# Patient Record
Sex: Male | Born: 1935 | Race: White | Hispanic: No | Smoking: Former smoker
Health system: Southern US, Community
[De-identification: ages and names within clinical notes are randomized; demographics above are authoritative.]

## PROBLEM LIST (undated history)

## (undated) DIAGNOSIS — Q396 Congenital diverticulum of esophagus: Secondary | ICD-10-CM

## (undated) DIAGNOSIS — I10 Essential (primary) hypertension: Secondary | ICD-10-CM

## (undated) DIAGNOSIS — N400 Enlarged prostate without lower urinary tract symptoms: Secondary | ICD-10-CM

## (undated) DIAGNOSIS — I4891 Unspecified atrial fibrillation: Secondary | ICD-10-CM

## (undated) DIAGNOSIS — R9389 Abnormal findings on diagnostic imaging of other specified body structures: Secondary | ICD-10-CM

## (undated) DIAGNOSIS — R55 Syncope and collapse: Secondary | ICD-10-CM

## (undated) DIAGNOSIS — J449 Chronic obstructive pulmonary disease, unspecified: Secondary | ICD-10-CM

## (undated) DIAGNOSIS — I480 Paroxysmal atrial fibrillation: Secondary | ICD-10-CM

## (undated) DIAGNOSIS — M549 Dorsalgia, unspecified: Secondary | ICD-10-CM

## (undated) DIAGNOSIS — G8929 Other chronic pain: Secondary | ICD-10-CM

## (undated) HISTORY — DX: Paroxysmal atrial fibrillation: I48.0

## (undated) HISTORY — DX: Syncope and collapse: R55

## (undated) HISTORY — DX: Essential (primary) hypertension: I10

## (undated) HISTORY — DX: Unspecified atrial fibrillation: I48.91

## (undated) HISTORY — PX: APPENDECTOMY: SHX54

## (undated) HISTORY — DX: Abnormal findings on diagnostic imaging of other specified body structures: R93.89

## (undated) HISTORY — DX: Chronic obstructive pulmonary disease, unspecified: J44.9

## (undated) HISTORY — PX: JOINT REPLACEMENT: SHX530

---

## 1998-04-18 ENCOUNTER — Other Ambulatory Visit: Admission: RE | Admit: 1998-04-18 | Discharge: 1998-04-18 | Payer: Self-pay | Admitting: Cardiology

## 1998-04-21 ENCOUNTER — Ambulatory Visit (HOSPITAL_COMMUNITY): Admission: RE | Admit: 1998-04-21 | Discharge: 1998-04-21 | Payer: Self-pay | Admitting: Cardiology

## 1998-10-02 ENCOUNTER — Ambulatory Visit (HOSPITAL_COMMUNITY): Admission: RE | Admit: 1998-10-02 | Discharge: 1998-10-02 | Payer: Self-pay | Admitting: Gastroenterology

## 1999-01-31 ENCOUNTER — Encounter: Payer: Self-pay | Admitting: Urology

## 1999-01-31 ENCOUNTER — Inpatient Hospital Stay (HOSPITAL_COMMUNITY): Admission: RE | Admit: 1999-01-31 | Discharge: 1999-02-16 | Payer: Self-pay | Admitting: Surgery

## 1999-02-11 ENCOUNTER — Encounter: Payer: Self-pay | Admitting: Surgery

## 1999-02-13 ENCOUNTER — Encounter: Payer: Self-pay | Admitting: Urology

## 1999-02-13 ENCOUNTER — Encounter: Payer: Self-pay | Admitting: Surgery

## 1999-12-16 ENCOUNTER — Observation Stay (HOSPITAL_COMMUNITY): Admission: EM | Admit: 1999-12-16 | Discharge: 1999-12-17 | Payer: Self-pay | Admitting: *Deleted

## 2000-04-11 ENCOUNTER — Ambulatory Visit (HOSPITAL_COMMUNITY): Admission: RE | Admit: 2000-04-11 | Discharge: 2000-04-11 | Payer: Self-pay | Admitting: Gastroenterology

## 2002-08-10 ENCOUNTER — Ambulatory Visit (HOSPITAL_COMMUNITY): Admission: RE | Admit: 2002-08-10 | Discharge: 2002-08-10 | Payer: Self-pay | Admitting: Cardiology

## 2002-08-25 ENCOUNTER — Encounter: Admission: RE | Admit: 2002-08-25 | Discharge: 2002-08-25 | Payer: Self-pay | Admitting: Critical Care Medicine

## 2002-08-25 ENCOUNTER — Encounter: Payer: Self-pay | Admitting: Critical Care Medicine

## 2002-10-29 ENCOUNTER — Ambulatory Visit: Admission: RE | Admit: 2002-10-29 | Discharge: 2002-10-29 | Payer: Self-pay | Admitting: Internal Medicine

## 2003-03-28 ENCOUNTER — Encounter: Payer: Self-pay | Admitting: Critical Care Medicine

## 2003-03-28 ENCOUNTER — Ambulatory Visit (HOSPITAL_COMMUNITY): Admission: RE | Admit: 2003-03-28 | Discharge: 2003-03-28 | Payer: Self-pay | Admitting: Critical Care Medicine

## 2004-05-21 ENCOUNTER — Encounter: Admission: RE | Admit: 2004-05-21 | Discharge: 2004-05-21 | Payer: Self-pay | Admitting: Cardiology

## 2004-10-01 ENCOUNTER — Ambulatory Visit: Payer: Self-pay | Admitting: Internal Medicine

## 2004-10-01 ENCOUNTER — Inpatient Hospital Stay (HOSPITAL_COMMUNITY): Admission: RE | Admit: 2004-10-01 | Discharge: 2004-10-02 | Payer: Self-pay | Admitting: Internal Medicine

## 2004-10-02 ENCOUNTER — Encounter: Payer: Self-pay | Admitting: Cardiovascular Disease

## 2004-10-05 ENCOUNTER — Inpatient Hospital Stay (HOSPITAL_COMMUNITY): Admission: EM | Admit: 2004-10-05 | Discharge: 2004-10-08 | Payer: Self-pay | Admitting: Emergency Medicine

## 2004-10-05 ENCOUNTER — Ambulatory Visit: Payer: Self-pay | Admitting: Cardiology

## 2004-10-11 ENCOUNTER — Ambulatory Visit: Payer: Self-pay

## 2004-10-19 ENCOUNTER — Ambulatory Visit: Payer: Self-pay | Admitting: Internal Medicine

## 2005-01-03 ENCOUNTER — Encounter: Admission: RE | Admit: 2005-01-03 | Discharge: 2005-01-03 | Payer: Self-pay | Admitting: Otolaryngology

## 2005-01-30 ENCOUNTER — Ambulatory Visit: Payer: Self-pay | Admitting: Internal Medicine

## 2005-08-15 ENCOUNTER — Encounter: Admission: RE | Admit: 2005-08-15 | Discharge: 2005-08-15 | Payer: Self-pay | Admitting: Geriatric Medicine

## 2006-01-28 ENCOUNTER — Ambulatory Visit: Payer: Self-pay | Admitting: Cardiology

## 2006-02-03 ENCOUNTER — Ambulatory Visit: Admission: RE | Admit: 2006-02-03 | Discharge: 2006-02-03 | Payer: Self-pay | Admitting: Cardiology

## 2006-12-25 ENCOUNTER — Ambulatory Visit: Payer: Self-pay | Admitting: Cardiology

## 2007-01-01 ENCOUNTER — Ambulatory Visit: Payer: Self-pay | Admitting: Cardiology

## 2007-01-01 LAB — CONVERTED CEMR LAB
Alkaline Phosphatase: 83 units/L (ref 39–117)
Bilirubin, Direct: 0.2 mg/dL (ref 0.0–0.3)
Eosinophils Absolute: 0.1 10*3/uL (ref 0.0–0.6)
Eosinophils Relative: 1.6 % (ref 0.0–5.0)
GFR calc Af Amer: 92 mL/min
GFR calc non Af Amer: 76 mL/min
Glucose, Bld: 84 mg/dL (ref 70–99)
HCT: 39.3 % (ref 39.0–52.0)
HDL: 55.1 mg/dL (ref >39.0)
Lymphocytes Relative: 12.5 % (ref 12.0–46.0)
MCV: 100.5 fL — ABNORMAL HIGH (ref 78.0–100.0)
Neutro Abs: 5.9 10*3/uL (ref 1.4–7.7)
Neutrophils Relative %: 75.9 % (ref 43.0–77.0)
Potassium: 3.8 meq/L (ref 3.5–5.1)
Sodium: 140 meq/L (ref 135–145)
TSH: 2.17 microintl units/mL (ref 0.35–5.50)
Triglycerides: 55 mg/dL (ref 0–149)
WBC: 7.8 10*3/uL (ref 4.5–10.5)

## 2007-01-28 ENCOUNTER — Ambulatory Visit: Payer: Self-pay | Admitting: Cardiology

## 2007-01-28 LAB — CONVERTED CEMR LAB
Calcium: 9 mg/dL (ref 8.4–10.5)
Chloride: 104 meq/L (ref 96–112)
GFR calc non Af Amer: 86 mL/min
Sodium: 143 meq/L (ref 135–145)

## 2007-05-03 ENCOUNTER — Emergency Department (HOSPITAL_COMMUNITY): Admission: EM | Admit: 2007-05-03 | Discharge: 2007-05-04 | Payer: Self-pay | Admitting: Emergency Medicine

## 2007-06-05 ENCOUNTER — Ambulatory Visit: Payer: Self-pay | Admitting: Cardiology

## 2007-06-12 ENCOUNTER — Ambulatory Visit: Payer: Self-pay

## 2007-06-20 ENCOUNTER — Emergency Department (HOSPITAL_COMMUNITY): Admission: EM | Admit: 2007-06-20 | Discharge: 2007-06-20 | Payer: Self-pay | Admitting: Emergency Medicine

## 2007-07-02 ENCOUNTER — Emergency Department (HOSPITAL_COMMUNITY): Admission: EM | Admit: 2007-07-02 | Discharge: 2007-07-02 | Payer: Self-pay | Admitting: Emergency Medicine

## 2007-07-08 ENCOUNTER — Encounter (INDEPENDENT_AMBULATORY_CARE_PROVIDER_SITE_OTHER): Payer: Self-pay | Admitting: Surgery

## 2007-07-08 ENCOUNTER — Ambulatory Visit (HOSPITAL_COMMUNITY): Admission: RE | Admit: 2007-07-08 | Discharge: 2007-07-09 | Payer: Self-pay | Admitting: Surgery

## 2007-09-08 ENCOUNTER — Ambulatory Visit (HOSPITAL_COMMUNITY): Admission: RE | Admit: 2007-09-08 | Discharge: 2007-09-08 | Payer: Self-pay | Admitting: Geriatric Medicine

## 2008-01-21 ENCOUNTER — Ambulatory Visit: Payer: Self-pay | Admitting: Cardiology

## 2008-01-27 ENCOUNTER — Ambulatory Visit: Payer: Self-pay | Admitting: Cardiology

## 2008-01-27 LAB — CONVERTED CEMR LAB
ALT: 22 units/L (ref 0–53)
AST: 25 units/L (ref 0–37)
Albumin: 4 g/dL (ref 3.5–5.2)
Alkaline Phosphatase: 81 units/L (ref 39–117)
BUN: 24 mg/dL — ABNORMAL HIGH (ref 6–23)
CO2: 31 meq/L (ref 19–32)
Eosinophils Relative: 2.1 % (ref 0.0–5.0)
GFR calc Af Amer: 92 mL/min
Glucose, Bld: 96 mg/dL (ref 70–99)
HCT: 37.7 % — ABNORMAL LOW (ref 39.0–52.0)
Hemoglobin: 12.9 g/dL — ABNORMAL LOW (ref 13.0–17.0)
Monocytes Absolute: 0.6 10*3/uL (ref 0.1–1.0)
Monocytes Relative: 8.6 % (ref 3.0–12.0)
Platelets: 393 10*3/uL (ref 150–400)
Potassium: 3.9 meq/L (ref 3.5–5.1)
Total Protein: 6.7 g/dL (ref 6.0–8.3)
WBC: 6.7 10*3/uL (ref 4.5–10.5)

## 2008-05-31 ENCOUNTER — Encounter: Admission: RE | Admit: 2008-05-31 | Discharge: 2008-05-31 | Payer: Self-pay | Admitting: Urology

## 2008-09-12 ENCOUNTER — Encounter: Admission: RE | Admit: 2008-09-12 | Discharge: 2008-10-10 | Payer: Self-pay | Admitting: Geriatric Medicine

## 2008-11-07 ENCOUNTER — Ambulatory Visit (HOSPITAL_COMMUNITY): Admission: RE | Admit: 2008-11-07 | Discharge: 2008-11-07 | Payer: Self-pay | Admitting: Geriatric Medicine

## 2008-11-08 ENCOUNTER — Encounter: Admission: RE | Admit: 2008-11-08 | Discharge: 2008-11-21 | Payer: Self-pay | Admitting: Geriatric Medicine

## 2008-11-09 ENCOUNTER — Encounter: Admission: RE | Admit: 2008-11-09 | Discharge: 2008-11-09 | Payer: Self-pay | Admitting: Geriatric Medicine

## 2008-11-22 ENCOUNTER — Encounter: Admission: RE | Admit: 2008-11-22 | Discharge: 2008-11-22 | Payer: Self-pay | Admitting: Geriatric Medicine

## 2008-12-08 ENCOUNTER — Encounter: Admission: RE | Admit: 2008-12-08 | Discharge: 2008-12-08 | Payer: Self-pay | Admitting: Geriatric Medicine

## 2009-02-06 ENCOUNTER — Encounter: Admission: RE | Admit: 2009-02-06 | Discharge: 2009-02-06 | Payer: Self-pay | Admitting: Geriatric Medicine

## 2009-03-23 ENCOUNTER — Ambulatory Visit: Payer: Self-pay | Admitting: Cardiology

## 2009-03-23 DIAGNOSIS — I4891 Unspecified atrial fibrillation: Secondary | ICD-10-CM | POA: Insufficient documentation

## 2009-03-23 DIAGNOSIS — I10 Essential (primary) hypertension: Secondary | ICD-10-CM

## 2009-03-23 DIAGNOSIS — R079 Chest pain, unspecified: Secondary | ICD-10-CM

## 2009-03-27 ENCOUNTER — Telehealth: Payer: Self-pay | Admitting: Cardiology

## 2009-04-03 ENCOUNTER — Telehealth (INDEPENDENT_AMBULATORY_CARE_PROVIDER_SITE_OTHER): Payer: Self-pay | Admitting: *Deleted

## 2009-04-04 ENCOUNTER — Ambulatory Visit: Payer: Self-pay

## 2009-04-04 ENCOUNTER — Encounter: Payer: Self-pay | Admitting: Cardiology

## 2009-04-06 ENCOUNTER — Telehealth: Payer: Self-pay | Admitting: Cardiology

## 2009-04-07 ENCOUNTER — Encounter: Payer: Self-pay | Admitting: Cardiology

## 2009-05-08 ENCOUNTER — Telehealth: Payer: Self-pay | Admitting: Cardiology

## 2009-05-11 ENCOUNTER — Ambulatory Visit (HOSPITAL_COMMUNITY): Admission: RE | Admit: 2009-05-11 | Discharge: 2009-05-11 | Payer: Self-pay | Admitting: Orthopedic Surgery

## 2009-05-11 ENCOUNTER — Encounter: Payer: Self-pay | Admitting: Cardiology

## 2009-06-26 ENCOUNTER — Telehealth: Payer: Self-pay | Admitting: Cardiology

## 2009-06-27 ENCOUNTER — Ambulatory Visit: Payer: Self-pay | Admitting: Cardiology

## 2009-07-03 ENCOUNTER — Inpatient Hospital Stay (HOSPITAL_COMMUNITY): Admission: RE | Admit: 2009-07-03 | Discharge: 2009-07-06 | Payer: Self-pay | Admitting: Orthopedic Surgery

## 2009-08-15 ENCOUNTER — Encounter (INDEPENDENT_AMBULATORY_CARE_PROVIDER_SITE_OTHER): Payer: Self-pay | Admitting: *Deleted

## 2009-08-16 ENCOUNTER — Telehealth: Payer: Self-pay | Admitting: Cardiology

## 2009-12-12 ENCOUNTER — Ambulatory Visit: Payer: Self-pay | Admitting: Cardiology

## 2009-12-12 DIAGNOSIS — I5032 Chronic diastolic (congestive) heart failure: Secondary | ICD-10-CM

## 2010-03-01 ENCOUNTER — Encounter: Payer: Self-pay | Admitting: Cardiology

## 2010-03-23 ENCOUNTER — Emergency Department (HOSPITAL_COMMUNITY)
Admission: EM | Admit: 2010-03-23 | Discharge: 2010-03-23 | Payer: Self-pay | Source: Home / Self Care | Admitting: Emergency Medicine

## 2010-03-26 ENCOUNTER — Telehealth: Payer: Self-pay | Admitting: Cardiology

## 2010-03-30 ENCOUNTER — Ambulatory Visit: Payer: Self-pay | Admitting: Cardiology

## 2010-06-15 ENCOUNTER — Ambulatory Visit: Payer: Self-pay | Admitting: Internal Medicine

## 2010-06-15 DIAGNOSIS — R930 Abnormal findings on diagnostic imaging of skull and head, not elsewhere classified: Secondary | ICD-10-CM

## 2010-06-19 LAB — CONVERTED CEMR LAB
ALT: 23 units/L (ref 0–53)
AST: 30 units/L (ref 0–37)
BUN: 20 mg/dL (ref 6–23)
Basophils Absolute: 0 10*3/uL (ref 0.0–0.1)
Bilirubin, Direct: 0.3 mg/dL (ref 0.0–0.3)
Calcium: 8.9 mg/dL (ref 8.4–10.5)
Creatinine, Ser: 0.9 mg/dL (ref 0.4–1.5)
Eosinophils Relative: 1 % (ref 0.0–5.0)
GFR calc non Af Amer: 81.81 mL/min (ref >60)
Monocytes Relative: 6.2 % (ref 3.0–12.0)
Neutrophils Relative %: 79.1 % — ABNORMAL HIGH (ref 43.0–77.0)
Platelets: 373 10*3/uL (ref 150.0–400.0)
Pro B Natriuretic peptide (BNP): 49.8 pg/mL (ref 0.0–100.0)
RDW: 13.8 % (ref 11.5–14.6)
Total Bilirubin: 1.1 mg/dL (ref 0.3–1.2)
WBC: 5.8 10*3/uL (ref 4.5–10.5)

## 2010-07-19 ENCOUNTER — Telehealth: Payer: Self-pay | Admitting: Internal Medicine

## 2010-08-06 ENCOUNTER — Emergency Department (HOSPITAL_COMMUNITY): Admission: EM | Admit: 2010-08-06 | Discharge: 2010-08-07 | Payer: Self-pay | Admitting: Emergency Medicine

## 2010-08-28 ENCOUNTER — Encounter (INDEPENDENT_AMBULATORY_CARE_PROVIDER_SITE_OTHER): Payer: Self-pay | Admitting: *Deleted

## 2010-11-13 NOTE — Assessment & Plan Note (Signed)
Summary: f61m   Visit Type:  Follow-up Primary Provider:  stoneking  CC:  no cardaic complaints.  History of Present Illness: The patient is 77 years old and return for management of atrial fibrillation, bradycardia, and diastolic heart failure. He has a history of paroxysmal fibrillation which is managed with amiodarone and Coumadin. I saw him preoperatively last September at which time he had marked bradycardia. We have our name with a Myoview scan which was negative for ischemia and showed normal LV function. We approved him for total knee replacement by Dr.Olin  Despite his advanced   He mistakenly did not cut his amiodarone back to 100 mg and is currently taking 200 mg daily.  Dr. Larina Bras came stopped his amiodarone because his blood pressure was low and controlled without it.  He has been doing well recently with no chest pain shortness of breath or palpitations. He's had minimal edema.   Current Medications (verified): 1)  Warfarin Sodium 2 Mg Tabs (Warfarin Sodium) .... As Directed 2)  Aspirin 81 Mg Tbec (Aspirin) .... Take One Tablet By Mouth Daily 3)  Doxazosin Mesylate 4 Mg Tabs (Doxazosin Mesylate) .... As Directed 4)  Furosemide 80 Mg Tabs (Furosemide) .... Take One Tablet By Mouth Daily. 5)  Tylenol 1250mg  .... As Needed 6)  Hydrocodone-Acetaminophen 7.5-750 Mg Tabs (Hydrocodone-Acetaminophen) .... 2  Tabs As Needed 7)  Temazepam 30 Mg Caps (Temazepam) .... As Directed 8)  Polyethylene Glycol 3350  Powd (Polyethylene Glycol 3350) .... As Needed 9)  Multivitamins   Tabs (Multiple Vitamin) .... Daily 10)  Fentanyl 25 Mcg/hr Pt72 (Fentanyl) .Marland Kitchen.. 1 Patch Every 3rd Day 11)  Amiodarone Hcl 200 Mg Tabs (Amiodarone Hcl) .Marland Kitchen.. 1 Tab Once Daily 12)  Cymbalta 20 Mg Cpep (Duloxetine Hcl) .Marland Kitchen.. 1 Tab Once Daily For Depression  Allergies (verified): No Known Drug Allergies  Past History:  Past Medical History: Reviewed history from 03/22/2009 and no changes required.  Significant  for COPD,  mild insufficiency, and   hypertension.                                                                                                                                                                1.  Atrial fibrillation with very slow ventricular rate and symptoms of      weakness.  2.  History of paroxysmal atrial fibrillation, currently on amiodarone load.  3.  Previous syncope probably related to sinus pause, finally conversion      from atrial fibrillation to sinus rhythm.  4.  Good left ventricular function.  5.  Chronic obstructive pulmonary disease.  6.  History of hypertension.      Review of Systems       ROS is negative except as outlined in HPI.   Vital Signs:  Patient profile:   77 year old male Height:      66 inches Weight:      164 pounds Pulse rate:   45 / minute BP sitting:   120 / 70  (left arm) Cuff size:   regular  Vitals Entered By: Burnett Kanaris, CNA (December 12, 2009 11:05 AM)  Physical Exam  Additional Exam:  Gen. Well-nourished, in no distress   Neck: No JVD, thyroid not enlarged, no carotid bruits Lungs: No tachypnea, clear without rales, rhonchi or wheezes Cardiovascular: Rhythm regular, PMI not displaced,  heart sounds  normal, no murmurs or gallops, trace to 1+ peripheral edema, pulses normal in all 4 extremities. Abdomen: BS normal, abdomen soft and non-tender without masses or organomegaly, no hepatosplenomegaly. MS: No deformities, no cyanosis or clubbing   Neuro:  No focal sns   Skin:  no lesions    Impression & Recommendations:  Problem # 1:  ATRIAL FIBRILLATION (ICD-427.31)  He has paroxysmal atrial fibrillation which is controlled with amiodarone and Coumadin. His rate is still quite slow at 45. He mistakenly was taking 200 mg of amiodarone and we will decrease this to 100 mg daily. His updated medication list for this problem includes:    Warfarin Sodium 2 Mg Tabs (Warfarin sodium) .Marland Kitchen... As directed    Aspirin 81 Mg Tbec  (Aspirin) .Marland Kitchen... Take one tablet by mouth daily    Amiodarone Hcl 200 Mg Tabs (Amiodarone hcl) .Marland Kitchen... 1/2  tab once daily  Orders: EKG w/ Interpretation (93000)  Problem # 2:  HYPERTENSION, BENIGN (ICD-401.1)  This is currently controlled without any antihypertensive medications. The amlodipine was discontinued.   The following medications were removed from the medication list:    Amlodipine Besylate 5 Mg Tabs (Amlodipine besylate) .Marland Kitchen... Take one tablet by mouth daily His updated medication list for this problem includes:    Aspirin 81 Mg Tbec (Aspirin) .Marland Kitchen... Take one tablet by mouth daily    Doxazosin Mesylate 4 Mg Tabs (Doxazosin mesylate) .Marland Kitchen... As directed    Furosemide 80 Mg Tabs (Furosemide) .Marland Kitchen... Take one tablet by mouth daily.  Orders: EKG w/ Interpretation (93000)  Problem # 3:  DIASTOLIC HEART FAILURE, CHRONIC (ICD-428.32)  He has a history of diastolic heart failure. He has trace to 1+ lower extremity edema which may be related to venous insufficiency since there is no JVD. He is on chronic Lasix therapy. We will continue his current therapy. The following medications were removed from the medication list:    Amlodipine Besylate 5 Mg Tabs (Amlodipine besylate) .Marland Kitchen... Take one tablet by mouth daily His updated medication list for this problem includes:    Warfarin Sodium 2 Mg Tabs (Warfarin sodium) .Marland Kitchen... As directed    Aspirin 81 Mg Tbec (Aspirin) .Marland Kitchen... Take one tablet by mouth daily    Furosemide 80 Mg Tabs (Furosemide) .Marland Kitchen... Take one tablet by mouth daily.    Amiodarone Hcl 200 Mg Tabs (Amiodarone hcl) .Marland Kitchen... 1/2  tab once daily  Orders: EKG w/ Interpretation (93000)  Patient Instructions: 1)  Your physician has recommended you make the following change in your medication: 1) Decrease amiodarone to 100mg  once daily  2)  Your physician wants you to follow-up in: 1 year with Dr. Shirlee Latch.  You will receive a reminder letter in the mail two months in advance. If you don't  receive a letter, please call our office to schedule the follow-up appointment. 3)  Make Dr. Pete Glatter aware of your amiodarone being decreased to 1/2 tablet  daily due to your coumadin.

## 2010-11-13 NOTE — Progress Notes (Signed)
Summary: pt had chest pain on saturday  Phone Note Call from Patient Call back at Home Phone [phone]   Caller: Patient Reason for Call: Talk to Nurse, Talk to Doctor Summary of Call: pt was in ER saturday and was told he was in a-fib and he wants to discuss it with someone Initial call taken by: Omer Jack,  March 26, 2010 10:46 AM  Follow-up for Phone Call        03/26/10--spoke with pt earlier this am--pt states went to hosp. this past weekend with CP and after eval. was told "heart was OK" but pt had gone back into a fib--per notes it appears dr Pete Glatter had reduced amiordaone to 100mg  due to low heart rate--advised, increase amiordarone to 200mg  and come in to see dr Juanda Chance 03/30/10 at 1130am--per dr Nazyia Gaugh--nt    pt agrees Follow-up by: Ledon Snare, RN,  March 26, 2010 12:36 PM

## 2010-11-13 NOTE — Letter (Signed)
Summary: Appointment - Reminder 2  [facility], Main Office  [address]   [place], [place] [address]   Phone: [phone]  Fax: [phone]         August 28, 2010 MRN: [id number]       [facility] [address] [place], [place]  [address]     Dear [name],  Our records indicate that it is time to schedule a follow-up appointment.  Dr.Mclean recommended that you follow up with [place] in December 2011. It is very important that we reach you to schedule this appointment. We look forward to participating in your health care needs. Please contact us at the number listed above at your earliest convenience to schedule your appointment.  If you are unable to make an appointment at this time, give [place] a call so we can update our records.     Sincerely,   Glass blower/designer

## 2010-11-13 NOTE — Letter (Signed)
Summary: [facility]   Imported By: Marylou Mccoy 04/27/2010 11:20:11  _____________________________________________________________________  External Attachment:    Type:   Image     Comment:   External Document

## 2010-11-13 NOTE — Progress Notes (Signed)
Summary: nos appt  Phone Note Call from Patient   Caller: [email or web address]  Call For: wert Summary of Call: In ref to nos from 10/5, pt states he doesn't want to rsc. Initial call taken by: Darletta Moll,  July 19, 2010 3:17 PM     Appended Document: nos appt as long as his breathing is not getting worse this is fine with me

## 2010-11-13 NOTE — Assessment & Plan Note (Signed)
Summary: appt 11:30/rov/chest pain/afib/per dr Tylin Stradley/jml   Primary Provider:  stoneking  CC:  chest pain/.  History of Present Illness: The patient is 77 years old and return for management of atrial fibrillation. About 2 weeks ago he developed chest pain and went to [facility] long emergency room. He had an electrocardiogram which showed atrial fibrillation with a rate of 107 but no acute changes. He had blood tests and a chest x-ray. It was recommended that he stay but he decided to go home instead. He has had no recurrence of his symptoms since that time. He went back on a full dose of 200 mg of amiodarone following his ED visit.  His head no palpitations. He is unable to tell when he goes into atrial fibrillation.  His other problems include hypertension and COPD.  Current Medications (verified): 1)  Warfarin Sodium 2 Mg Tabs (Warfarin Sodium) .... As Directed 2)  Aspirin 81 Mg Tbec (Aspirin) .... Take One Tablet By Mouth Daily 3)  Doxazosin Mesylate 4 Mg Tabs (Doxazosin Mesylate) .... As Directed 4)  Furosemide 80 Mg Tabs (Furosemide) .... Take One Tablet By Mouth Daily. 5)  Tylenol 1250mg  .... As Needed 6)  Hydrocodone-Acetaminophen 7.5-750 Mg Tabs (Hydrocodone-Acetaminophen) .... 2  Tabs As Needed 7)  Temazepam 30 Mg Caps (Temazepam) .... As Directed 8)  Polyethylene Glycol 3350  Powd (Polyethylene Glycol 3350) .... As Needed 9)  Multivitamins   Tabs (Multiple Vitamin) .... Daily 10)  Fentanyl 25 Mcg/hr Pt72 (Fentanyl) .Marland Kitchen.. 1 Patch Every 3rd Day 11)  Amiodarone Hcl 200 Mg Tabs (Amiodarone Hcl) .... 1/2  Tab Once Daily 12)  Cymbalta 20 Mg Cpep (Duloxetine Hcl) .Marland Kitchen.. 1 Tab Once Daily For Depression 13)  Amoxicillin 250 Mg Caps (Amoxicillin) .... Take One Capsule Per Day Per Oral Surgeon.  Allergies (verified): No Known Drug Allergies  Past History:  Past Medical History: Reviewed history from 03/22/2009 and no changes required.  Significant for COPD,  mild insufficiency, and   hypertension.                                                                                                                                                                1.  Atrial fibrillation with very slow ventricular rate and symptoms of      weakness.  2.  History of paroxysmal atrial fibrillation, currently on amiodarone load.  3.  Previous syncope probably related to sinus pause, finally conversion      from atrial fibrillation to sinus rhythm.  4.  Good left ventricular function.  5.  Chronic obstructive pulmonary disease.  6.  History of hypertension.      Review of Systems       ROS is negative except as outlined in HPI.   Vital Signs:  Patient  profile:   77 year old male Height:      66 inches Weight:      161 pounds BMI:     26.08 Pulse rate:   63 / minute Pulse rhythm:   regular BP sitting:   118 / 66  (left arm) Cuff size:   regular  Vitals Entered By: Burnett Kanaris, CNA (March 30, 2010 12:08 PM)  Physical Exam  Additional Exam:  Gen. Well-nourished, in no distress   Neck: No JVD, thyroid not enlarged, no carotid bruits Lungs: No tachypnea, clear without rales, rhonchi or wheezes Cardiovascular: Rhythm regular, PMI not displaced,  heart sounds  normal, no murmurs or gallops, no peripheral edema, pulses normal in all 4 extremities. Abdomen: BS normal, abdomen soft and non-tender without masses or organomegaly, no hepatosplenomegaly. MS: No deformities, no cyanosis or clubbing   Neuro:  No focal sns   Skin:  no lesions    Impression & Recommendations:  Problem # 1:  ATRIAL FIBRILLATION (ICD-427.31) He recently had recurrent atrial fibrillation while on a lower dose of amiodarone. He is now back on 200 mg daily and we will continue this. He is also taking Coumadin. His updated medication list for this problem includes:    Warfarin Sodium 2 Mg Tabs (Warfarin sodium) .Marland Kitchen... As directed    Aspirin 81 Mg Tbec (Aspirin) .Marland Kitchen... Take one tablet by mouth daily     Amiodarone Hcl 200 Mg Tabs (Amiodarone hcl) .Marland Kitchen... 1/2  tab once daily  Problem # 2:  HYPERTENSION, BENIGN (ICD-401.1) This appears well controlled on current medications. His updated medication list for this problem includes:    Aspirin 81 Mg Tbec (Aspirin) .Marland Kitchen... Take one tablet by mouth daily    Doxazosin Mesylate 4 Mg Tabs (Doxazosin mesylate) .Marland Kitchen... As directed    Furosemide 80 Mg Tabs (Furosemide) .Marland Kitchen... Take one tablet by mouth daily.  Other Orders: EKG w/ Interpretation (93000)  Patient Instructions: 1)  Your physician recommends that you schedule a follow-up appointment in: 6 months with Dr. Shirlee Latch. The office will mail you a reminder card 2 months prior appointment time.

## 2010-11-13 NOTE — Assessment & Plan Note (Signed)
Summary: Pulmonary/ new pt eval    Visit Type:  Initial Consult Copy to:  Dr. Pete Glatter Primary Provider/Referring Provider:  Dr. Pete Glatter  CC:  Dyspnea.  History of Present Illness: [name] quit smoking 1960's stopped due to heart burn > improved with no resp sequelae apparent then  June 15, 2010  1st pulmonary office eval in EMR era  with indolent onset x 15 years progressive doe using scooter for anything more than 50 ft walking  worse x  one year assoc with hoarseness and mild dysphagia sore throat  and nasal congestion.  no sign cough.  Pt denies any significant itching, sneezing,  excess or purulent nasal secretions,  fever, chills, sweats,   pleuritic or exertional cp, hempoptysis, change in activity tolerance  orthopnea pnd or leg swelling.  Pt also denies any obvious fluctuation in symptoms with weather or environmental change or other alleviating or aggravating factors.   "nothing's ever helped"  Current Medications (verified): 1)  Warfarin Sodium 2 Mg Tabs (Warfarin Sodium) .... As Directed 2)  Aspirin 81 Mg Tbec (Aspirin) .... Take One Tablet By Mouth Daily 3)  Doxazosin Mesylate 4 Mg Tabs (Doxazosin Mesylate) .... As Directed 4)  Furosemide 80 Mg Tabs (Furosemide) .... Take One Tablet By Mouth Daily. 5)  Temazepam 30 Mg Caps (Temazepam) .... As Directed 6)  Polyethylene Glycol 3350  Powd (Polyethylene Glycol 3350) .... As Needed 7)  Fentanyl 25 Mcg/hr Pt75 (Fentanyl) .Marland Kitchen.. 1 Patch Every 3rd Day 8)  Amiodarone Hcl 200 Mg Tabs (Amiodarone Hcl) .Marland Kitchen.. 1 Once Daily 9)  Cymbalta 20 Mg Cpep (Duloxetine Hcl) .Marland Kitchen.. 1 Tab Once Daily For Depression  Allergies (verified): No Known Drug Allergies  Past History:  Past Medical History:  1.  Atrial fibrillation with very slow ventricular rate and symptoms of      weakness.  2.  History of paroxysmal atrial fibrillation, currently on amiodarone load.  3.  Previous syncope probably related to sinus pause, finally conversion      from  atrial fibrillation to sinus rhythm.  4.  Good left ventricular function.  5.  Chronic obstructive pulmonary disease.  6.  History of hypertension. Abnormal CXR     - Elevated R HD 07/06/2007 > no change June 15, 2010       Family History: Heart dz- Brother- afib Negative for respiratory diseases or atopy   Social History: Divorced Lives alone Has children Former smoker.  Quit in 1965. Smoked for approx 15 yrs up to 3 ppd ETOH - daily  Retired Forensic scientist  Review of Systems       The patient complains of shortness of breath with activity, shortness of breath at rest, loss of appetite, weight change, sore throat, tooth/dental problems, nasal congestion/difficulty breathing through nose, ear ache, anxiety, and hand/feet swelling.  The patient denies productive cough, non-productive cough, coughing up blood, chest pain, irregular heartbeats, acid heartburn, indigestion, abdominal pain, difficulty swallowing, headaches, sneezing, itching, depression, joint stiffness or pain, rash, change in color of mucus, and fever.    Vital Signs:  Patient profile:   77 year old male Weight:      162 pounds O2 Sat:      93 % on Room air Temp:     98.0 degrees F oral Pulse rate:   70 / minute BP sitting:   124 / 76  (left arm)  Vitals Entered By: Vernie Murders (June 15, 2010 1:55 PM)  O2 Flow:  Room air  Serial Vital Signs/Assessments:  Comments: 2:29 PM Ambulatory Pulse Oximetry  Resting; HR__74___    02 Sat__93%ra___  Lap1 (185 feet)   HR_116___   02 Sat__93%ra___ Lap2 (185 feet)   HR_____   02 Sat_____    Lap3 (185 feet)   HR_____   02 Sat_____  ___Test Completed without Difficulty _x__Test Stopped due to: pt c/o feeling tired and SOB.   By: Vernie Murders    Physical Exam  Additional Exam:  Gen. chronically ill amb wm very somber with somewhat of a hopeless/helpless affect and attitude   wt 161 > 162 June 15, 2010  HEENT mild/ moderate nonspecific   turbinate edema.  Oropharynx no thrush or excess pnd or cobblestoning.  No JVD or cervical adenopathy. Mild accessory muscle hypertrophy. Trachea midline, nl thryroid. Chest was hyperinflated by percussion with mod kyphosis and  diminished breath sounds and moderate increased exp time without wheeze. Hoover sign positive at mid inspiration. Regular rate and rhythm without murmur gallop or rub or increase P2 or edema.  Abd: no hsm, nl excursion. Ext warm without cyanosis or clubbing.       Sodium                    140 mEq/L                   135-145   Potassium                 4.3 mEq/L                   3.5-5.1   Chloride                  105 mEq/L                   96-112   Carbon Dioxide            27 mEq/L                    19-32   Glucose                   98 mg/dL                    57-84   BUN                       20 mg/dL                    6-96   Creatinine                0.9 mg/dL                   2.9-5.2   Calcium                   8.9 mg/dL                   8.4-13.2   GFR                       81.81 mL/min                >60  Tests: (2) CBC Platelet w/Diff (CBCD)   White Cell Count          5.8 K/uL  4.5-10.5   Red Cell Count       [L]  3.31 Mil/uL                 4.22-5.81   Hemoglobin           [L]  11.8 g/dL                   04.5-40.9   Hematocrit           [L]  34.8 %                      39.0-52.0   MCV                  [H]  105.5 fl                    78.0-100.0   MCHC                      33.9 g/dL                   81.1-91.4   RDW                       13.8 %                      11.5-14.6   Platelet Count            373.0 K/uL                  150.0-400.0   Neutrophil %         [H]  79.1 %                      43.0-77.0   Lymphocyte %              12.9 %                      12.0-46.0   Monocyte %                6.2 %                       3.0-12.0   Eosinophils%              1.0 %                       0.0-5.0   Basophils %               0.8 %                        0.0-3.0   Neutrophill Absolute      4.6 K/uL                    1.4-7.7   Lymphocyte Absolute       0.7 K/uL                    0.7-4.0   Monocyte Absolute         0.4 K/uL                    0.1-1.0  Eosinophils, Absolute  0.1 K/uL                    0.0-0.7   Basophils Absolute        0.0 K/uL                    0.0-0.1  Tests: (3) Hepatic/Liver Function Panel (HEPATIC)   Total Bilirubin           1.1 mg/dL                   6.0-4.5   Direct Bilirubin          0.3 mg/dL                   4.0-9.8   Alkaline Phosphatase      83 U/L                      39-117   AST                       30 U/L                      0-37   ALT                       23 U/L                      0-53   Total Protein             6.4 g/dL                    1.1-9.1   Albumin                   3.9 g/dL                    4.7-8.2  Tests: (4) TSH (TSH)   FastTSH                   0.77 uIU/mL                 0.35-5.50  Tests: (5) B-Type Natiuretic Peptide (BNPR)  B-Type Natriuetic Peptide                             49.8 pg/mL                  0.0-100.0  Tests: (6) Sed Rate (ESR)   Sed Rate                  20 mm/hr                    0-22  CXR  Procedure date:  06/15/2010  Findings:      Stable cardiomegaly and stable interstitial accentuation.  No acute findings.  Impression & Recommendations:  Problem # 1:  DYSPNEA (ICD-786.05) No def ILD on cxr though hard to exclude incipient PF and is on amiodarone so will bring him back for PFTs baseline, suspect most of his problems are related to kyphosis and geriatric decline.  No need for 02 at this point.    DDX of  difficult airways managment all start with A and  include Adherence, Ace Inhibitors, Acid Reflux, Active Sinus Disease, Alpha 1  Antitripsin deficiency, Anxiety masquerading as Airways dz,  ABPA,  allergy(esp in young), Aspiration (esp in elderly), Adverse effects of DPI,  Active smokers, plus one B  =  Beta blocker use..  and one C = CHF (excluded by nl bnp for all intents and purposes)  ? Acid reflux >  See instructions for specific recommendations   ? active sinus dz > sinus ct next step  ? Anxiety/ depression>  dx of exclusion  Problem # 2:  ABNORMAL LUNG XRAY (ICD-793.1) Elevated R HD present since 07/06/2007 is the main finding on plain cxr and does not need further w/u at this point.  Medications Added to Medication List This Visit: 1)  Amiodarone Hcl 200 Mg Tabs (Amiodarone hcl) .Marland Kitchen.. 1 once daily 2)  Prilosec Otc 20 Mg Tbec (Omeprazole magnesium) .... Take  one 30-60 min before first meal of the day 3)  Pepcid Ac Maximum Strength 20 Mg Tabs (Famotidine) .... One at bedtime  Other Orders: T-2 View CXR (71020TC) New Patient Level V (08657) TLB-BMP (Basic Metabolic Panel-BMET) (80048-METABOL) TLB-CBC Platelet - w/Differential (85025-CBCD) TLB-Hepatic/Liver Function Pnl (80076-HEPATIC) TLB-TSH (Thyroid Stimulating Hormone) (84443-TSH) TLB-BNP (B-Natriuretic Peptide) (83880-BNPR) TLB-Sedimentation Rate (ESR) (85652-ESR) Pulse Oximetry, Ambulatory (84696)  Patient Instructions: 1)  GERD (REFLUX)  is a common cause of respiratory symptoms. It commonly presents without heartburn and can be treated with medication, but also with lifestyle changes including avoidance of late meals, excessive alcohol, smoking cessation, and avoid fatty foods, chocolate, peppermint, colas, red wine, and acidic juices such as orange juice. NO MINT OR MENTHOL PRODUCTS SO NO COUGH DROPS  2)  USE SUGARLESS CANDY INSTEAD (jolley ranchers)  3)  NO OIL BASED VITAMINS  4)  Prilosec 20 mg Take  one 30-60 min before first meal of the day and pepcid 20 mg at bedtime 5)  Please schedule a follow-up appointment in 4 weeks, sooner if needed  with PFT's on return

## 2010-12-13 ENCOUNTER — Other Ambulatory Visit: Payer: Self-pay | Admitting: Dermatology

## 2010-12-31 LAB — DIFFERENTIAL
Basophils Relative: 0 % (ref 0–1)
Eosinophils Absolute: 0.2 10*3/uL (ref 0.0–0.7)
Eosinophils Relative: 2 % (ref 0–5)
Monocytes Absolute: 1.3 10*3/uL — ABNORMAL HIGH (ref 0.1–1.0)
Monocytes Relative: 12 % (ref 3–12)
Neutro Abs: 7.5 10*3/uL (ref 1.7–7.7)

## 2010-12-31 LAB — URINALYSIS, ROUTINE W REFLEX MICROSCOPIC
Glucose, UA: NEGATIVE mg/dL
Hgb urine dipstick: NEGATIVE
Protein, ur: NEGATIVE mg/dL
Specific Gravity, Urine: 1.016 (ref 1.005–1.030)
Urobilinogen, UA: 1 mg/dL (ref 0.0–1.0)

## 2010-12-31 LAB — CBC
HCT: 35.7 % — ABNORMAL LOW (ref 39.0–52.0)
Hemoglobin: 11.9 g/dL — ABNORMAL LOW (ref 13.0–17.0)
MCHC: 33.2 g/dL (ref 30.0–36.0)
MCV: 107.6 fL — ABNORMAL HIGH (ref 78.0–100.0)
Platelets: 334 10*3/uL (ref 150–400)
RBC: 3.32 MIL/uL — ABNORMAL LOW (ref 4.22–5.81)
RDW: 13.9 % (ref 11.5–15.5)
WBC: 10.9 10*3/uL — ABNORMAL HIGH (ref 4.0–10.5)

## 2010-12-31 LAB — BRAIN NATRIURETIC PEPTIDE: Pro B Natriuretic peptide (BNP): 184 pg/mL — ABNORMAL HIGH (ref 0.0–100.0)

## 2010-12-31 LAB — BASIC METABOLIC PANEL
CO2: 29 mEq/L (ref 19–32)
Chloride: 108 mEq/L (ref 96–112)
GFR calc Af Amer: >60 mL/min (ref >60)
Glucose, Bld: 130 mg/dL — ABNORMAL HIGH (ref 70–99)
Potassium: 3.8 mEq/L (ref 3.5–5.1)
Sodium: 142 mEq/L (ref 135–145)

## 2010-12-31 LAB — POCT CARDIAC MARKERS
CKMB, poc: <1 ng/mL — ABNORMAL LOW (ref 1.0–8.0)
Myoglobin, poc: 71.8 ng/mL (ref 12–200)
Troponin i, poc: <0.05 ng/mL (ref 0.00–0.09)

## 2011-01-18 LAB — URINALYSIS, ROUTINE W REFLEX MICROSCOPIC
Bilirubin Urine: NEGATIVE
Hgb urine dipstick: NEGATIVE
Nitrite: NEGATIVE
Specific Gravity, Urine: 1.02 (ref 1.005–1.030)
pH: 6 (ref 5.0–8.0)

## 2011-01-18 LAB — DIFFERENTIAL
Basophils Absolute: 0 10*3/uL (ref 0.0–0.1)
Eosinophils Relative: 1 % (ref 0–5)
Lymphocytes Relative: 11 % — ABNORMAL LOW (ref 12–46)
Lymphs Abs: 0.9 10*3/uL (ref 0.7–4.0)
Monocytes Absolute: 0.4 10*3/uL (ref 0.1–1.0)
Monocytes Relative: 5 % (ref 3–12)

## 2011-01-18 LAB — BASIC METABOLIC PANEL
BUN: 15 mg/dL (ref 6–23)
BUN: 20 mg/dL (ref 6–23)
CO2: 29 mEq/L (ref 19–32)
Calcium: 8.2 mg/dL — ABNORMAL LOW (ref 8.4–10.5)
Chloride: 106 mEq/L (ref 96–112)
Creatinine, Ser: 1 mg/dL (ref 0.4–1.5)
GFR calc Af Amer: >60 mL/min (ref >60)
GFR calc non Af Amer: >60 mL/min (ref >60)
GFR calc non Af Amer: >60 mL/min (ref >60)
Glucose, Bld: 109 mg/dL — ABNORMAL HIGH (ref 70–99)
Glucose, Bld: 98 mg/dL (ref 70–99)
Glucose, Bld: 100 mg/dL — ABNORMAL HIGH (ref 70–99)
Potassium: 4.8 mEq/L (ref 3.5–5.1)
Potassium: 4.4 mEq/L (ref 3.5–5.1)
Sodium: 140 mEq/L (ref 135–145)

## 2011-01-18 LAB — PROTIME-INR
INR: 1.1 (ref 0.00–1.49)
INR: 1.2 (ref 0.00–1.49)
INR: 1.3 (ref 0.00–1.49)
Prothrombin Time: 15.8 seconds — ABNORMAL HIGH (ref 11.6–15.2)

## 2011-01-18 LAB — CBC
HCT: 27.1 % — ABNORMAL LOW (ref 39.0–52.0)
HCT: 36.2 % — ABNORMAL LOW (ref 39.0–52.0)
Hemoglobin: 12.2 g/dL — ABNORMAL LOW (ref 13.0–17.0)
MCHC: 34.3 g/dL (ref 30.0–36.0)
MCV: 105.7 fL — ABNORMAL HIGH (ref 78.0–100.0)
Platelets: 246 10*3/uL (ref 150–400)
RBC: 3.43 MIL/uL — ABNORMAL LOW (ref 4.22–5.81)
RDW: 15.1 % (ref 11.5–15.5)
RDW: 15.3 % (ref 11.5–15.5)
RDW: 15 % (ref 11.5–15.5)

## 2011-01-18 LAB — APTT: aPTT: 25 seconds (ref 24–37)

## 2011-01-18 LAB — TYPE AND SCREEN
ABO/RH(D): A POS
Antibody Screen: NEGATIVE

## 2011-01-20 LAB — CBC
HCT: 33.1 % — ABNORMAL LOW (ref 39.0–52.0)
Hemoglobin: 11.1 g/dL — ABNORMAL LOW (ref 13.0–17.0)
MCHC: 33.7 g/dL (ref 30.0–36.0)
MCV: 104.6 fL — ABNORMAL HIGH (ref 78.0–100.0)
RBC: 3.16 MIL/uL — ABNORMAL LOW (ref 4.22–5.81)
WBC: 7 10*3/uL (ref 4.0–10.5)

## 2011-01-20 LAB — URINALYSIS, ROUTINE W REFLEX MICROSCOPIC
Glucose, UA: NEGATIVE mg/dL
Hgb urine dipstick: NEGATIVE
Specific Gravity, Urine: 1.013 (ref 1.005–1.030)
Urobilinogen, UA: 0.2 mg/dL (ref 0.0–1.0)

## 2011-01-20 LAB — DIFFERENTIAL
Basophils Relative: 0 % (ref 0–1)
Eosinophils Absolute: 0.1 10*3/uL (ref 0.0–0.7)
Lymphs Abs: 1.4 10*3/uL (ref 0.7–4.0)
Monocytes Absolute: 0.6 10*3/uL (ref 0.1–1.0)
Monocytes Relative: 9 % (ref 3–12)

## 2011-01-20 LAB — BASIC METABOLIC PANEL
CO2: 32 mEq/L (ref 19–32)
Chloride: 104 mEq/L (ref 96–112)
GFR calc Af Amer: >60 mL/min (ref >60)
Potassium: 5.1 mEq/L (ref 3.5–5.1)
Sodium: 141 mEq/L (ref 135–145)

## 2011-01-20 LAB — TYPE AND SCREEN: Antibody Screen: NEGATIVE

## 2011-01-20 LAB — ABO/RH: ABO/RH(D): A POS

## 2011-02-17 ENCOUNTER — Emergency Department (HOSPITAL_COMMUNITY): Payer: Medicare Other

## 2011-02-17 ENCOUNTER — Emergency Department (HOSPITAL_COMMUNITY)
Admission: EM | Admit: 2011-02-17 | Discharge: 2011-02-17 | Disposition: A | Discharge status: Home / Self Care | Payer: Medicare Other | Attending: Emergency Medicine | Admitting: Emergency Medicine

## 2011-02-17 DIAGNOSIS — I4891 Unspecified atrial fibrillation: Secondary | ICD-10-CM | POA: Insufficient documentation

## 2011-02-17 DIAGNOSIS — I452 Bifascicular block: Secondary | ICD-10-CM | POA: Insufficient documentation

## 2011-02-17 DIAGNOSIS — R4182 Altered mental status, unspecified: Secondary | ICD-10-CM | POA: Insufficient documentation

## 2011-02-17 DIAGNOSIS — F29 Unspecified psychosis not due to a substance or known physiological condition: Secondary | ICD-10-CM | POA: Insufficient documentation

## 2011-02-17 LAB — URINALYSIS, ROUTINE W REFLEX MICROSCOPIC
Ketones, ur: NEGATIVE mg/dL
Nitrite: NEGATIVE
Urobilinogen, UA: 1 mg/dL (ref 0.0–1.0)
pH: 5 (ref 5.0–8.0)

## 2011-02-17 LAB — RAPID URINE DRUG SCREEN, HOSP PERFORMED
Cocaine: NOT DETECTED
Tetrahydrocannabinol: NOT DETECTED

## 2011-02-26 NOTE — Op Note (Signed)
[name], [name]                  ACCOUNT NO.:  [id number]   MEDICAL RECORD NO.:  [id number]          PATIENT TYPE:  OIB   LOCATION:  5707                         FACILITY:  [facility]   PHYSICIAN:  Velora Heckler, MD      DATE OF BIRTH:  [birth year]   DATE OF PROCEDURE:  07/08/2007  DATE OF DISCHARGE:  07/02/2007                               OPERATIVE REPORT   PREOPERATIVE DIAGNOSIS:  1. Incarcerated left inguinal hernia.  2. Sebaceous cyst right buttock.   POSTOPERATIVE DIAGNOSIS:  1. Incarcerated left inguinal hernia.  2. Sebaceous cyst right buttock.   PROCEDURE:  1. Repair left inguinal hernia with Ethicon UltraPro mesh.  2. Excise sebaceous cyst (2 cm) from right buttock.   SURGEON:  Velora Heckler, M.D., FACS   ANESTHESIA:  Spinal anesthesia per Dr. Arta Bruce.   ESTIMATED BLOOD LOSS:  Minimal.   PREPARATION:  Betadine.   COMPLICATIONS:  None.   INDICATIONS:  The patient is an 77 year old white male with long  standing left inguinal hernia.  This has gradually increased in size and  recently become incarcerated.  The patient now comes to surgery for  repair.  He desires concurrent excision of a sebaceous cyst from the  right buttock which causes pain with sitting.   BODY OF REPORT:  The procedure was done in OR number 17 at [facility].  The patient is brought to the operating room and placed in  the supine position on the operating room table.  Following  administration of intravenous sedation, the patient is turned to a right  lateral decubitus position and a spinal block is placed by Dr. Michelle Piper.  The patient was then turned back to a supine position.   The patient is prepped and draped in the usual strict aseptic fashion.  After ascertaining that an adequate level of anesthetic had been  achieved from the spinal block, a left inguinal incision was made with a  #15 blade.  Dissection was carried down through subcutaneous tissues and  hemostasis obtained  with electrocautery.  The external oblique fascia  was incised in line with its fibers and extended through the external  inguinal ring.  There was a large indirect inguinal hernia which extends  into the left hemiscrotum.  It was delivered up and out of the  hemiscrotum.  The sac is opened.  The sac contains incarcerated omentum.  The sac is opened widely.  There is a moderate amount of ascitic fluid  which was evacuated.  The omentum was freed from its attachments to the  hernia sac and reduced back within the peritoneal cavity.  The hernia  sac is then excised.  The defect at the level of the internal inguinal  ring is closed with a running 0-0 Prolene suture.  Next, the floor of  the inguinal canal was recreated a sheet of Ethicon UltraPro mesh.  The  mesh was cut to the appropriate dimensions.  It is secured to the pubic  tubercle along the inguinal ligament with a running 2-0 Novofil suture.  The mesh was  split to accommodate the cord structures.  The superior  margin of the mesh was secured to the transversalis and internal oblique  fascia with interrupted 2-0 Novofil sutures.  The tails of the mesh were  overlapped lateral to the cord structures and secured to the inguinal  ligament with interrupted 2-0 Novofil sutures.  A local field block is  placed with Marcaine.  Cord structures were returned to the inguinal  canal.  The external oblique fascia is closed with interrupted 3-0  Vicryl sutures.  The subcutaneous tissues were closed with interrupted 3-  0 Vicryl sutures.  The skin was closed with running 4-0 Monocryl  subcuticular suture.  The wound is washed and dried and Benzoin and  Steri-Strips were applied.  Sterile dressings were applied.   The patient was turned to a right lateral decubitus position, again.  The previously marked sebaceous cyst at the base of the right buttock is  prepped and draped.  After ascertaining that adequate anesthesia had  been maintained, an  elliptical incision was made so as to encompass the  entire cyst.  Dissection was carried through subcutaneous tissues with  electrocautery used for hemostasis.  The cyst is excised in its  entirety.  It measures approximately 2 cm in greatest dimension.  The  skin was closed with interrupted 3-0 nylon simple sutures.  Dry gauze  dressings were placed.   The patient is awakened from anesthesia and brought to the recovery room  in stable condition.  The patient tolerated the procedure well.      Velora Heckler, MD  Electronically Signed     TMG/MEDQ  D:  07/08/2007  T:  07/08/2007  Job:  [id number]   cc:   Hal T. Stoneking, M.D.

## 2011-02-26 NOTE — Assessment & Plan Note (Signed)
[facility]                            CARDIOLOGY OFFICE NOTE   [name], [name]                         MRN:          [id number]  DATE:01/21/2008                            DOB:          [birth year]    PRIMARY CARE PHYSICIAN:  [name], M.D.   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  He  returns for followup management of atrial fibrillation.  He has a long  history of paroxysmal atrial fibrillation which been treated with  amiodarone and Coumadin.  He has had no recent palpitations and has had  no chest pain or shortness of breath.   We had evaluated him last summer prior inguinal hernia surgery repair  with a Myoview scan which showed normal LV function and no evidence of  ischemia.   PAST MEDICAL HISTORY:  Significant for COPD,  mild insufficiency, and  hypertension.   CURRENT MEDICATIONS:  1. Coumadin.  2. Aspirin.  3. Doxazosin.  4. Amiodarone 200 mg daily.  5. Amlodipine 5 mg daily.  6. Furosemide 80 mg daily.   PHYSICAL EXAMINATION:  VITAL SIGNS:  Blood pressure 120/70 and pulse 63  and regular.  NECK:  There is no venous distension.  Carotid pulses were full without  bruits.  CHEST:  Clear.  CARDIAC:  Rhythm was regular.  No murmurs or gallops.  ABDOMEN:  The abdomen is soft.  Normal bowel sounds.  There is no  hepatosplenomegaly.  EXTREMITIES:  Peripheral pulses are full. There is no peripheral edema.   LABORATORY DATA:  Electrocardiogram showed sinus rhythm with a prolonged  QT interval and first degree AV block.   IMPRESSION:  1. Paroxysmal atrial fibrillation controlled on amiodarone and      Coumadin therapy.  2. History of diastolic dysfunction, now compensated.  3. 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  4. Hypertension.   RECOMMENDATIONS:  I think [name] is doing quite well.  He says he has  had no  symptomatic recurrence of his atrial fibrillation.  Will continue  same medications.  Will have him  come in next week for laboratory studies for amiodarone surveillance  with liver function tests and TSH.  Will also get a BMP and CBC.  I will  see him back in follow-up in a year.     Evan Elvera Lennox Juanda Chance, MD, [facility]  Electronically Signed    BRB/MedQ  DD: 01/21/2008  DT: 01/22/2008  Job #: [id number]   cc:   [name], M.D.

## 2011-02-26 NOTE — H&P (Signed)
[name], [name]                  ACCOUNT NO.:  [id number]   MEDICAL RECORD NO.:  [id number]          PATIENT TYPE:  INP   LOCATION:                               FACILITY:  [facility]   PHYSICIAN:  Madlyn Frankel. Charlann Boxer, M.D.  DATE OF BIRTH:  11/30/1921   DATE OF ADMISSION:  05/16/2009  DATE OF DISCHARGE:                              HISTORY & PHYSICAL   PROCEDURE:  A right total knee replacement.   CHIEF COMPLAINT:  Right knee pain.   HISTORY OF PRESENT ILLNESS:  An 76 year old male with a history of right  knee pain secondary to osteoarthritis.  It has been refractory to all  conservative treatment.  He also has a history of a painful left knee  with end-stage osteoarthritis of that knee as well.  When seen for his  history and physical he was having persistent left knee pain and his  left knee was injected with cortisone at the time.  This was done to  alleviate pain and allow him to rehab appropriately after his right  total knee replacement.  He has been presurgically assessed prior to  surgery.   PRIMARY CARE PHYSICIAN:  Dr. Merlene Laughter.   CARDIOLOGIST:  Dr. Juanda Chance.   SURGEON:  Dr. Darnell Level.   PAST MEDICAL HISTORY:  1. Osteoarthritis.  2. Atrial fibrillation.  3. Impaired vision.  4. Impaired hearing.   PREVIOUS SURGERIES:  1. Hernia repair 2008.  2. Appendectomy.  3. Arthroscopic surgery both knees in 1979.   FAMILY HISTORY:  Noncontributory.   SOCIAL HISTORY:  Divorced, nonsmoker.  He is planning a rehab stay  postoperatively with preference being number one [facility], and number  two [facility].   DRUG ALLERGIES:  NO KNOWN DRUG ALLERGIES.   MEDICATIONS:  1. Coumadin 2 - 3 mg daily.  2. Aspirin 81 mg daily.  3. Doxazosin 4 mg p.o. daily.  4. Amiodarone 200 mg p.o. daily.  5. Amlodipine 5 mg p.o. daily.  6. Furosemide 80 mg p.o. daily.  7. Tylenol 1250 mg p.o. p.r.n.  8. Vicodin 7.5/750 two p.o. p.r.n., maximum 6 a day.  9. Mirtazapine 15 mg daily.  10.Temazepam 30 mg daily.  11.Polyethylene glycol p.o. daily.  12.Calcium 600 plus vitamin D 400 p.o. b.i.d.  13.Vitamin B12 one thousand units daily.  14.Multivitamin daily.  15.Fish oil 1200 mg p.o. daily.  16.Vitamin D 3000 units daily.  17.Fentanyl 24 mcg patch q.72 hours.  18.Celebrex 200 mg one p.o. b.i.d. x2 weeks after surgery.   REVIEW OF SYSTEMS:  HEENT:  Has hearing loss, ringing in his ears.  RESPIRATORY:  Shortness of breath on exertion.  GENITOURINARY:  Has weak  stream.  Foley catheter is very painful, prefers condom catheter at time  of surgery.  MUSCULOSKELETAL:  He has joint pain, muscular pain and back  pain.   Otherwise see HPI.   PHYSICAL EXAMINATION:  Pulse 72, respirations 16, blood pressure 116/60.  GENERAL:  Awake, alert and oriented.  HEENT:  Normocephalic.  NECK:  Supple, no carotid bruits.  CHEST:  Lungs clear to auscultation bilaterally.  BREASTS:  Deferred.  HEART:  S1 - S2 distinct.  ABDOMEN:  Soft, bowel sounds present.  PELVIS:  Stable.  GENITOURINARY:  Deferred.  EXTREMITIES:  Left knee swollen and painful.  Right knee is 3 - 120  degrees range of motion.  SKIN:  No cellulitis.  NEUROLOGIC:  Intact distal sensibilities.   LABORATORY DATA:  Labs, EKG, chest x-ray pending presurgical testing.   IMPRESSION:  Right knee osteoarthritis.   PLAN OF ACTION:  A right total knee replacement by Dr. Charlann Boxer at [facility] on May 16, 2009.  Questions encouraged, answered and reviewed.  The patient is planning on a postoperative rehab stay in [facility],  [facility].     ______________________________  Yetta Glassman. [address], [place]      Madlyn Frankel. Charlann Boxer, M.D.  Electronically Signed    BLM/MEDQ  D:  05/03/2009  T:  05/03/2009  Job:  [id number]

## 2011-02-26 NOTE — H&P (Signed)
NAMESRICHARAN, LACOMB                  ACCOUNT NO.:  [id number]   MEDICAL RECORD NO.:  [id number]          PATIENT TYPE:  EMS   LOCATION:  ED                           FACILITY:  [facility]   PHYSICIAN:  Anselm Pancoast. Weatherly, M.D.DATE OF BIRTH:  1921-11-09   DATE OF ADMISSION:  06/20/2007  DATE OF DISCHARGE:  06/20/2007                              HISTORY & PHYSICAL   CHIEF COMPLAINT:  Pain and left inguinal hernia.   HISTORY:  [name] is an 77 year old Caucasian male patient of Dr.  Ricky Stabs who was here in the emergency room in July 21st with an  incarcerated left inguinal hernia, and this, I think, was manually  reduced by Dr. Marcille Blanco.  The patient is a patient of Dr. Kem Kays and has seen Dr. Gerrit Friends in the office with his hernia that the  patient states has been present for well over a year.  The hernia became  larger approximately 3-4 months ago, and Dr. Gerrit Friends has not tentatively  scheduled him for surgery, but had talked with Dr. Juanda Chance, the patient's  cardiologist, since he is on Coumadin and also amiodarone for cardiac  issues.  He is also on diuretics.  The patient states that he saw Dr.  Juanda Chance on Friday after having a Cardiolite test, and he said it would be  safe to proceed with an inguinal herniorrhaphy.  The patient, this  morning, said that this hernia, he was not able to manually reduce it.  The pain had been going on for several hours.  The pain supposedly had  been going on about 12 hours when he was seen here in July, and  I recommended he come on to the emergency room so I could see him and  see if I can manually reduce the hernia.  The patient arrived about 30  minutes later, and in the patient's room exam #1, with no sedation, I  was able to manually reduce the hernia without much difficulty.  It is a  large hernia going to the scrotum, and I think it is very likely that  this is possibly sort of a slider as well as small bowel in the moderate  sized hernia  sac.  With the hernia reduce, the patient is now pain free,  and he wants to wait and have Dr. Gerrit Friends who has operated on him  previously for a sigmoid colectomy at which time in sounds like he had a  fistula to his bladder that Dr. Patsi Sears repaired, and this was done  about 8 years ago.  The patient is completely pain free at this time.  His hernia is reduced and I am going to let him wait in the emergency  room probably 30 minutes and be up, walking around, make sure that he is  not having the discomfort.  As we discussed with him, plan B would be to  go ahead and keep him given fresh frozen plasma to reverse his Coumadin,  and I can operate on him tomorrow.  He said he would prefer waiting and  letting Dr. Gerrit Friends  repair his hernia.  I do think since his atrial  fibrillation, which he is on the Coumadin for, is not an issue at this  point since he is converted to normal sinus rhythm, but it would be  alright for him to stop his Coumadin so that if time permitted for Dr.  Gerrit Friends to repair it next week.  Hopefully, he will placed on the  scheduled for the surgical correction.  The patient said he would prefer  doing this instead of being admitted for tentative surgery tomorrow, and  as long as his pain is gone, his hernia is reduced, I think it would be  fine to follow that option.  If the patient's hernia does reoccur, and  he is not able to manually reduce it easily, he will return to the  emergency room and we will go with option B.  The patient was in  agreement with this.  At present, he will continue all the medications  that he is on for his cardiac, and that is amiodarone, Lasix, etc. with  the exception that he will definitely discontinue his Coumadin he takes  in the evening, and he has not taken a dose for today.  The patient will  be released after probably about 30 minutes of observation and stated  that he has had a previous colonoscopy.  It has been about 3 years ago,   but it appears that this is not really a change in his bowel movements,  prompting this hernia,  but this has been present at least a year and possibly longer.  I think  he had also had a previous herniorrhaphy on the opposite side according  to what he informs me.  There is no evidence of any incarcerated or  hernia that I can appreciate on the right side of this time.           ______________________________  Anselm Pancoast. Zachery Dakins, M.D.     WJW/MEDQ  D:  06/20/2007  T:  06/21/2007  Job:  [id number]   cc:   Velora Heckler, MD  [address]  [place]  [place] [address]   Everardo Beals. Juanda Chance, MD, [facility]  [address]  [place], [place] [address]

## 2011-02-26 NOTE — Assessment & Plan Note (Signed)
[facility]                            CARDIOLOGY OFFICE NOTE   CLEVEN, JANSMA                         MRN:          [id number]  DATE:06/05/2007                            DOB:          1922-06-17    PRIMARY CARE PHYSICIAN:  Dr. Merlene Laughter.   REASON FOR REFERRAL:  Preoperative evaluation prior to left inguinal  hernia repair.   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  He has history of paroxysmal atrial fibrillation which has been  controlled with amiodarone and Coumadin.   He has been doing well, has had no recent symptoms of chest pain or  palpitations.  He has chronic shortness of breath which he attributes to  his chronic obstructive pulmonary disease.  He also has a history of  edema which has been thought to be heart failure related to diastolic  dysfunction.   PAST MEDICAL HISTORY:  1. Hypertension.  2. Mild renal insufficiency.  3. COPD.   CURRENT MEDICATIONS:  Coumadin, aspirin, doxazosin, amiodarone and  furosemide.   SOCIAL HISTORY:  He is a retired Forensic scientist.  He does not smoke.   FAMILY HISTORY:  His father had colon cancer and his grandmother had  intestinal cancer.  There is no family history of coronary heart  disease.   REVIEW OF SYSTEMS:  Positive for symptoms related to his hernia and he  had to go to the emergency room one time because of inability to reduce  the hernia.   EXAMINATION:  His blood pressure was 154/77, the pulse 62 and regular.  There was no venous distention.  The carotid pulses were full.  There  were no bruits.  CHEST:  Clear without rales or rhonchi.  CARDIAC:  Rhythm was regular.  There was a short systolic murmur at the  left sternal edge.  I could hear no diastolic murmur.  ABDOMEN:  Soft with normal bowel sounds.  There was no  hepatosplenomegaly.  There was 1+ peripheral edema.   The pedal pulses were equal.  There were  also stasis changes in the lower extremities.  MUSCULOSKELETAL:  Showed no deformities except for kyphosis.  SKIN:  Warm and dry.  NEUROLOGIC:  Showed no focal neurological signs.   An electrocardiogram showed sinus rhythm and a slight intraventricular  conduction delay.   IMPRESSION:  1. Paroxysmal atrial fibrillation controlled on amiodarone.  2. History of volume-overload-related diastolic dysfunction.  3. Mild lower extremity edema, probably related to venous      insufficiency.  4. Kyphosis and restrictive pulmonary disease.  5. Hypertension.  6. Preoperative evaluation prior to hernia surgery.   RECOMMENDATIONS:  I think Mr. Mallon is quite stable from a cardiac  standpoint.  His main risk factors for vascular disease are his age and  hypertension.  We will plan to screen him with a rest-stress adenosine  Myoview scan.  We also discussed treatment of his borderline  hypertension and he is in favor of treating that  and we will start him  back on amlodipine.  Told him to watch for any increased swelling in the  lower extremities.  We will get amiodarone surveillance labs with his  preoperative labs and this will include liver function test and a TSH.  We will stop his Coumadin a week prior to his hernia surgery.     Bruce Elvera Lennox Juanda Chance, MD, [facility]  Electronically Signed    BRB/MedQ  DD: 06/05/2007  DT: 06/06/2007  Job #: [id number]   cc:   Velora Heckler, MD

## 2011-03-01 NOTE — Discharge Summary (Signed)
NAMEASCHER, SCHROEPFER                  ACCOUNT NO.:  [id number]   MEDICAL RECORD NO.:  [id number]          PATIENT TYPE:  INP   LOCATION:  2040                         FACILITY:  [facility]   PHYSICIAN:  Charlies Constable, M.D. [facility] DATE OF BIRTH:  1922-10-13   DATE OF ADMISSION:  10/05/2004  DATE OF DISCHARGE:  10/08/2004                                 DISCHARGE SUMMARY   PROCEDURES:  None.   HOSPITAL COURSE:  Mr. Arizpe is an 77 year old male with a history paroxysmal  atrial fibrillation and COPD.  He had been in the hospital for an amiodarone  load and came to the ER on the day of admission complaining of severe  weakness.  He was in atrial fibrillation with a low rate in the 30s and a  systolic blood pressure in the 80s.  His creatinine was 2.9 and his  potassium was 5.6.  He was given dopamine and his heart rate increased from  the 20-70 and his blood pressure systolic from 155-130.  He was given IV  fluids and hydrated and Kayexalate.  A renal consult was called.  He was  seen by the Renal Team who thought that he had acute renal failure that was  hemodynamically mediated secondary to decreased blood pressure and heart  rate and exacerbated by ARB and diuretic use.   All of his rate lowering medications were held.  His symptoms gradually  improved, his blood pressure and heart rate stabilized.  By October 08, 2004 he was in sinus rhythm with occasional nonconducted beats.  It was felt  that he might need a pacemaker, but he was stable to go home and follow up  in the office.  He was discharged off amiodarone, Digoxin and Toprol.   DISCHARGE INSTRUCTIONS:  He is to take a baby aspirin and Coumadin as prior  to admission.  He is to call our office for any further problems.  Our  office was to arrange followup.      RB/MEDQ  D:  12/25/2004  T:  12/25/2004  Job:  [id number]

## 2011-03-14 ENCOUNTER — Encounter: Payer: Self-pay | Admitting: Cardiology

## 2011-03-26 ENCOUNTER — Encounter: Payer: Self-pay | Admitting: Cardiology

## 2011-03-26 ENCOUNTER — Ambulatory Visit (INDEPENDENT_AMBULATORY_CARE_PROVIDER_SITE_OTHER): Payer: Medicare Other | Admitting: Cardiology

## 2011-03-26 DIAGNOSIS — I4891 Unspecified atrial fibrillation: Secondary | ICD-10-CM

## 2011-03-26 DIAGNOSIS — J449 Chronic obstructive pulmonary disease, unspecified: Secondary | ICD-10-CM

## 2011-03-26 DIAGNOSIS — R0602 Shortness of breath: Secondary | ICD-10-CM

## 2011-03-26 NOTE — Patient Instructions (Signed)
Schedule an appointment for an echocardiogram.   Schedule an appointment for lab--BMP/TSH/Liver profile  [phone]  Schedule an appointment for a pulmonary function test.  Your physician wants you to follow-up in: 6 months with Dr Shirlee Latch. (December 2012). You will receive a reminder letter in the mail two months in advance. If you don't receive a letter, please call our office to schedule the follow-up appointment.

## 2011-03-28 NOTE — Assessment & Plan Note (Signed)
Patient has chronic exertional dyspnea.  I will get an echo to make sure that LV systolic function remains preserved.

## 2011-03-28 NOTE — Assessment & Plan Note (Signed)
Paroxysmal.  On coumadin and amiodarone.  He is steady with his walker and has not fallen recently.  I will check TSH and LFTs given amiodarone use.  I will also get PFTs given chronic dyspnea.

## 2011-03-28 NOTE — Progress Notes (Signed)
PCP: Dr. Pete Glatter  77 yo with history of paroxysmal atrial fibrillation presents for cardiology followup.  Patient has been seen by Dr. Juanda Chance in the past and is seen by me for the first time today.  He has paroxysmal atrial fibrillation and is maintaining NSR on amiodarone. He is in NSR today.  He is on warfarin.  No recent falls.  He has severe kyphosis with chronic back pain and walks with a walker.  He is short of breath chronically if he walks fast for about 25 feet.  He has poor mobility due to his back problems.  No chest pain.  BP is on the low side today but he checks it frequently at home and it tends to run in the systolic 120s-130s range. He is not lightheaded.   ECG: NSR with poor baseline, incomplete RBBB, ? Old inferior MI  PMH: 1. Atrial fibrillation: Paroxysmal.  He is on coumadin and amiodarone.  Echo (12/05): EF 55-65%, mild aortic insufficiency, mild LV hypertrophy.  2. HTN 3. COPD: Heavy smoker in past but quit in 1965.  4. Syncope: Suspected to be due to post-conversion (atrial fib to NSR) pause.  5. Right TKR in 2010.  6. Inguinal hernia repair. 7. Kyphosis with chronic back pain.   SH: Divorced, lives alone.  Smoked up to 3 ppd but quit in 1965.  Retired Forensic scientist.  FH: Noncontributory.   ROS: All systems reviewed and negative except as per HPI.   Current Outpatient Prescriptions  Medication Sig Dispense Refill  . amiodarone (PACERONE) 200 MG tablet Take 200 mg by mouth daily.        Marland Kitchen aspirin 81 MG EC tablet Take 81 mg by mouth daily.        Marland Kitchen doxazosin (CARDURA) 4 MG tablet Take 4 mg by mouth as directed.        . fentaNYL (DURAGESIC - DOSED MCG/HR) 25 MCG/HR Place 1 patch onto the skin every 3 (three) days.        . OXYCODONE HCL PO as needed.        . warfarin (COUMADIN) 2 MG tablet Take 2 mg by mouth as directed.        . mirtazapine (REMERON) 7.5 MG tablet 1 tablet At bedtime.        BP 97/61  Pulse 71  Resp 14  Ht 5\' 3"  (1.6 m)  Wt 153 lb  (69.4 kg)  BMI 27.10 kg/m2 General: NAD, kyphotic, frail Neck: No JVD, no thyromegaly or thyroid nodule.  Lungs: Clear to auscultation bilaterally with normal respiratory effort. CV: Nondisplaced PMI.  Heart regular S1/S2, no S3/S4, no murmur.  No peripheral edema.  No carotid bruit.  Normal pedal pulses.  Abdomen: Soft, nontender, no hepatosplenomegaly, no distention.  Neurologic: Alert and oriented x 3.  Psych: Normal affect. Extremities: No clubbing or cyanosis.

## 2011-04-09 ENCOUNTER — Ambulatory Visit (HOSPITAL_COMMUNITY): Payer: Medicare Other | Attending: Cardiology

## 2011-04-09 ENCOUNTER — Other Ambulatory Visit (INDEPENDENT_AMBULATORY_CARE_PROVIDER_SITE_OTHER): Payer: Medicare Other | Admitting: *Deleted

## 2011-04-09 DIAGNOSIS — I379 Nonrheumatic pulmonary valve disorder, unspecified: Secondary | ICD-10-CM | POA: Insufficient documentation

## 2011-04-09 DIAGNOSIS — I079 Rheumatic tricuspid valve disease, unspecified: Secondary | ICD-10-CM | POA: Insufficient documentation

## 2011-04-09 DIAGNOSIS — R0609 Other forms of dyspnea: Secondary | ICD-10-CM | POA: Insufficient documentation

## 2011-04-09 DIAGNOSIS — J449 Chronic obstructive pulmonary disease, unspecified: Secondary | ICD-10-CM | POA: Insufficient documentation

## 2011-04-09 DIAGNOSIS — I080 Rheumatic disorders of both mitral and aortic valves: Secondary | ICD-10-CM | POA: Insufficient documentation

## 2011-04-09 DIAGNOSIS — I4891 Unspecified atrial fibrillation: Secondary | ICD-10-CM

## 2011-04-09 DIAGNOSIS — R55 Syncope and collapse: Secondary | ICD-10-CM | POA: Insufficient documentation

## 2011-04-09 DIAGNOSIS — R0989 Other specified symptoms and signs involving the circulatory and respiratory systems: Secondary | ICD-10-CM | POA: Insufficient documentation

## 2011-04-09 DIAGNOSIS — J4489 Other specified chronic obstructive pulmonary disease: Secondary | ICD-10-CM | POA: Insufficient documentation

## 2011-04-09 LAB — HEPATIC FUNCTION PANEL
ALT: 17 U/L (ref 0–53)
AST: 21 U/L (ref 0–37)
Albumin: 3.8 g/dL (ref 3.5–5.2)
Alkaline Phosphatase: 96 U/L (ref 39–117)
Bilirubin, Direct: 0.3 mg/dL (ref 0.0–0.3)
Total Protein: 6.3 g/dL (ref 6.0–8.3)

## 2011-04-09 LAB — BASIC METABOLIC PANEL
CO2: 31 mEq/L (ref 19–32)
Calcium: 8.6 mg/dL (ref 8.4–10.5)
Chloride: 108 mEq/L (ref 96–112)
Glucose, Bld: 88 mg/dL (ref 70–99)
Potassium: 4.9 mEq/L (ref 3.5–5.1)
Sodium: 144 mEq/L (ref 135–145)

## 2011-04-11 ENCOUNTER — Ambulatory Visit (INDEPENDENT_AMBULATORY_CARE_PROVIDER_SITE_OTHER): Payer: Medicare Other | Admitting: Internal Medicine

## 2011-04-11 DIAGNOSIS — I4891 Unspecified atrial fibrillation: Secondary | ICD-10-CM

## 2011-04-11 DIAGNOSIS — J449 Chronic obstructive pulmonary disease, unspecified: Secondary | ICD-10-CM

## 2011-04-11 LAB — PULMONARY FUNCTION TEST

## 2011-04-11 NOTE — Progress Notes (Signed)
PFT done today. 

## 2011-04-15 ENCOUNTER — Encounter: Payer: Self-pay | Admitting: Cardiology

## 2011-05-02 ENCOUNTER — Telehealth: Payer: Self-pay | Admitting: *Deleted

## 2011-05-02 ENCOUNTER — Other Ambulatory Visit: Payer: Self-pay | Admitting: Dermatology

## 2011-05-02 NOTE — Telephone Encounter (Signed)
Dr Shirlee Latch reviewed PFT results done 04/11/11--PFTs OK. I notified pt of results.

## 2011-05-15 ENCOUNTER — Other Ambulatory Visit: Payer: Self-pay | Admitting: Geriatric Medicine

## 2011-05-17 ENCOUNTER — Ambulatory Visit
Admission: RE | Admit: 2011-05-17 | Discharge: 2011-05-17 | Disposition: A | Discharge status: Home / Self Care | Payer: Medicare Other | Source: Ambulatory Visit | Attending: Geriatric Medicine | Admitting: Geriatric Medicine

## 2011-05-21 ENCOUNTER — Telehealth: Payer: Self-pay | Admitting: *Deleted

## 2011-05-21 NOTE — Telephone Encounter (Signed)
Dr Shirlee Latch received request from [facility] [phone] to hold warfarin prior to dental implants per Dr McLean--OK to hold warfarin prior to dental implants.  Pt is aware it is OK to hold warfarin.  He states he gets his PT at Dr Pete Glatter. Pt states he will let Dr Pete Glatter know Dr Shirlee Latch has said OK to hold warfarin for dental implant. I faxed response to [facility] [phone]. Original faxed response to HIM.

## 2011-07-25 LAB — CBC
HCT: 39.5
Hemoglobin: 13.4
RBC: 3.86 — ABNORMAL LOW
WBC: 6.7

## 2011-07-25 LAB — URINALYSIS, ROUTINE W REFLEX MICROSCOPIC
Hgb urine dipstick: NEGATIVE
Specific Gravity, Urine: 1.014
Urobilinogen, UA: 1
pH: 6

## 2011-07-25 LAB — BASIC METABOLIC PANEL
Chloride: 104
GFR calc non Af Amer: >60
Potassium: 3.8
Sodium: 140

## 2011-07-25 LAB — DIFFERENTIAL
Eosinophils Relative: 1
Lymphocytes Relative: 17
Lymphs Abs: 1.1
Monocytes Absolute: 0.5
Monocytes Relative: 8

## 2011-07-25 LAB — PROTIME-INR: Prothrombin Time: 15.7 — ABNORMAL HIGH

## 2011-07-29 LAB — URINALYSIS, ROUTINE W REFLEX MICROSCOPIC
Bilirubin Urine: NEGATIVE
Glucose, UA: NEGATIVE
Hgb urine dipstick: NEGATIVE
Specific Gravity, Urine: 1.017
pH: 5.5

## 2011-07-29 LAB — CBC
Hemoglobin: 13.9
MCHC: 34.4
RBC: 4.01 — ABNORMAL LOW

## 2011-07-29 LAB — DIFFERENTIAL
Lymphocytes Relative: 11 — ABNORMAL LOW
Monocytes Absolute: 0.7
Monocytes Relative: 7
Neutro Abs: 8.2 — ABNORMAL HIGH

## 2011-07-29 LAB — BASIC METABOLIC PANEL
CO2: 30
Calcium: 9.4
GFR calc Af Amer: >60
GFR calc non Af Amer: 59 — ABNORMAL LOW
Sodium: 143

## 2011-07-29 LAB — PROTIME-INR: INR: 1.8 — ABNORMAL HIGH

## 2011-08-07 ENCOUNTER — Other Ambulatory Visit: Payer: Self-pay | Admitting: Otolaryngology

## 2011-08-07 DIAGNOSIS — H9209 Otalgia, unspecified ear: Secondary | ICD-10-CM

## 2011-08-09 ENCOUNTER — Ambulatory Visit
Admission: RE | Admit: 2011-08-09 | Discharge: 2011-08-09 | Disposition: A | Discharge status: Home / Self Care | Payer: Medicare Other | Source: Ambulatory Visit | Attending: Otolaryngology | Admitting: Otolaryngology

## 2011-08-09 DIAGNOSIS — H9209 Otalgia, unspecified ear: Secondary | ICD-10-CM

## 2011-08-09 MED ORDER — IOHEXOL 300 MG/ML  SOLN
75.0000 mL | Freq: Once | INTRAMUSCULAR | Status: AC | PRN
  Administered 2011-08-09: 75 mL via INTRAVENOUS

## 2011-09-18 ENCOUNTER — Other Ambulatory Visit: Payer: Self-pay | Admitting: Dermatology

## 2011-11-07 ENCOUNTER — Other Ambulatory Visit: Payer: Self-pay | Admitting: Geriatric Medicine

## 2011-11-07 DIAGNOSIS — R9389 Abnormal findings on diagnostic imaging of other specified body structures: Secondary | ICD-10-CM

## 2011-11-15 ENCOUNTER — Other Ambulatory Visit: Payer: Medicare Other

## 2011-11-15 ENCOUNTER — Inpatient Hospital Stay: Admission: RE | Admit: 2011-11-15 | Payer: Medicare Other | Source: Ambulatory Visit

## 2011-11-20 ENCOUNTER — Ambulatory Visit
Admission: RE | Admit: 2011-11-20 | Discharge: 2011-11-20 | Disposition: A | Discharge status: Home / Self Care | Payer: Medicare Other | Source: Ambulatory Visit | Attending: Geriatric Medicine | Admitting: Geriatric Medicine

## 2011-11-20 DIAGNOSIS — R9389 Abnormal findings on diagnostic imaging of other specified body structures: Secondary | ICD-10-CM

## 2012-02-12 ENCOUNTER — Encounter (HOSPITAL_COMMUNITY): Payer: Self-pay | Admitting: Family Medicine

## 2012-02-12 ENCOUNTER — Emergency Department (HOSPITAL_COMMUNITY)
Admission: EM | Admit: 2012-02-12 | Discharge: 2012-02-12 | Disposition: A | Discharge status: Home / Self Care | Payer: Medicare Other | Attending: Emergency Medicine | Admitting: Emergency Medicine

## 2012-02-12 DIAGNOSIS — R4181 Age-related cognitive decline: Secondary | ICD-10-CM | POA: Insufficient documentation

## 2012-02-12 DIAGNOSIS — K5641 Fecal impaction: Secondary | ICD-10-CM | POA: Insufficient documentation

## 2012-02-12 DIAGNOSIS — K5900 Constipation, unspecified: Secondary | ICD-10-CM | POA: Insufficient documentation

## 2012-02-12 MED ORDER — FLEET ENEMA 7-19 GM/118ML RE ENEM
1.0000 | ENEMA | Freq: Once | RECTAL | Status: AC
  Administered 2012-02-12: 1 via RECTAL
  Filled 2012-02-12: qty 1

## 2012-02-12 MED ORDER — DOCUSATE SODIUM 100 MG PO CAPS: 100.0000 mg | ORAL_CAPSULE | Freq: Two times a day (BID) | ORAL | Status: AC

## 2012-02-12 MED ORDER — LACTULOSE 10 GM/15ML PO SOLN: 20.0000 g | Freq: Two times a day (BID) | ORAL | Status: AC

## 2012-02-12 NOTE — ED Notes (Signed)
Report received-airway intact-no s/s's of distress-will continue to monitor 

## 2012-02-12 NOTE — ED Notes (Signed)
Patient states "I am painfully constipated.". Last BM 3-4 days ago.

## 2012-02-12 NOTE — ED Provider Notes (Signed)
History     CSN: [id number]  Arrival date & time 02/12/12  4098   First MD Initiated Contact with Patient 02/12/12 0700      Chief Complaint  Patient presents with  . Constipation    HPI Pt presents with constipation.   It has been ongoing for the last 4 days.  He has not tried any medications and has not spoken to his doctor.  No vomiting or fever.  No abdominal pain.  Pt complains of pain in the rectal area and cannot have a BM.  Pt has been opiate pain medications for years but has never had trouble like this.   The constipation is severe.   Past Medical History  Diagnosis Date  . Atrial fibrillation with slow ventricular response     Symptoms of weakness  . Paroxysmal atrial fibrillation     Hx of, currently on amiodarone load  . Syncope     Probably related to sinus pause, finally conversion from atrial fibrillation to sinus rhythm  . COPD (chronic obstructive pulmonary disease)   . Hypertension   . Abnormal CXR 07/06/2007; 06/15/2010    Eleveted R HD 07/06/2007 > no change 09/92/2011    History reviewed. No pertinent past surgical history.  Family History  Problem Relation Age of Onset  . Heart disease Brother     Atrial fibrillation  . Atopy Neg Hx     History  Substance Use Topics  . Smoking status: Former Smoker -- 3.0 packs/day for 15 years    Quit date: 10/15/1963  . Smokeless tobacco: Not on file  . Alcohol Use: 3.5 oz/week    7 drink(s) per week      Review of Systems  Constitutional: Negative for fever.  Gastrointestinal: Positive for rectal pain. Negative for vomiting and blood in stool.  Neurological: Negative for light-headedness.  All other systems reviewed and are negative.    Allergies  Review of patient's allergies indicates no known allergies.  Home Medications   Current Outpatient Rx  Name Route Sig Dispense Refill  . AMIODARONE HCL 200 MG PO TABS Oral Take 200 mg by mouth daily.      . ASPIRIN 81 MG PO TBEC Oral Take 81 mg by mouth  daily.      Marland Kitchen DOXAZOSIN MESYLATE 4 MG PO TABS Oral Take 4 mg by mouth as directed.      . FENTANYL 25 MCG/HR TD PT72 Transdermal Place 1 patch onto the skin every 3 (three) days.      Marland Kitchen MIRTAZAPINE 7.5 MG PO TABS Oral Take 1 tablet by mouth At bedtime.     . OXYCODONE HCL PO Oral Take 10 mg by mouth as needed.     . WARFARIN SODIUM 2 MG PO TABS Oral Take 2 mg by mouth as directed.        There were no vitals taken for this visit.  Physical Exam  Nursing note and vitals reviewed. Constitutional: No distress.       Frail, kyphotic  HENT:  Head: Normocephalic and atraumatic.  Right Ear: External ear normal.  Left Ear: External ear normal.  Eyes: Conjunctivae are normal. Right eye exhibits no discharge. Left eye exhibits no discharge. No scleral icterus.  Neck: Neck supple. No tracheal deviation present.  Cardiovascular: Normal rate, regular rhythm and intact distal pulses.   Pulmonary/Chest: Effort normal and breath sounds normal. No stridor. No respiratory distress. He has no wheezes. He has no rales.  Abdominal: Soft. Bowel sounds are  normal. He exhibits no distension. There is no tenderness. There is no rebound and no guarding.  Genitourinary: Rectal exam shows tenderness. Rectal exam shows no mass.       Fecal impaction, hard brown stool in rectal vault  Musculoskeletal: He exhibits no edema and no tenderness.  Neurological: He is alert. He has normal strength. No cranial nerve deficit ( no gross defecits noted) or sensory deficit. He exhibits normal muscle tone. He displays no seizure activity. Coordination normal.       Walks with a walker  Skin: Skin is warm and dry. No rash noted.  Psychiatric: He has a normal mood and affect.    ED Course  Fecal disimpaction Performed by: Linwood Dibbles R Authorized by: Linwood Dibbles R Consent: Verbal consent obtained. Consent given by: patient Patient understanding: patient states understanding of the procedure being performed Time out:  Immediately prior to procedure a "time out" was called to verify the correct patient, procedure, equipment, support staff and site/side marked as required. Patient tolerance: Patient tolerated the procedure well with no immediate complications. Comments: 2 hard stool balls removed, hard stool still within the vault. Will try enemas     Labs Reviewed - No data to display No results found.    MDM  The patient's was given a fleets enema here in the emergency department. He had minimal results but he is feeling much better at this time. Patient states he has an appointment at 11:30 and would like to go home at this time. I did offer an additional enema but he would prefer to take oral medications at home. I also recommended the patient take a daily stool softener considering his chronic opiate pain medications. At this time and do not feel there is any other acute emergency medical condition and suspect that his symptoms are related to constipation.       Celene Kras, MD 02/12/12 [phone]

## 2012-02-12 NOTE — Discharge Instructions (Signed)

## 2012-02-12 NOTE — ED Notes (Addendum)
minimal response from enema-small amount of brown watery stool

## 2012-04-07 ENCOUNTER — Inpatient Hospital Stay (HOSPITAL_COMMUNITY)
Admission: EM | Admit: 2012-04-07 | Discharge: 2012-04-10 | DRG: 193 | Disposition: A | Discharge status: Skilled Nursing Facility | Payer: Medicare Other | Attending: Internal Medicine | Admitting: Internal Medicine

## 2012-04-07 ENCOUNTER — Emergency Department (HOSPITAL_COMMUNITY): Payer: Medicare Other

## 2012-04-07 ENCOUNTER — Encounter (HOSPITAL_COMMUNITY): Payer: Self-pay | Admitting: *Deleted

## 2012-04-07 DIAGNOSIS — I4891 Unspecified atrial fibrillation: Secondary | ICD-10-CM

## 2012-04-07 DIAGNOSIS — I5032 Chronic diastolic (congestive) heart failure: Secondary | ICD-10-CM

## 2012-04-07 DIAGNOSIS — M549 Dorsalgia, unspecified: Secondary | ICD-10-CM | POA: Diagnosis present

## 2012-04-07 DIAGNOSIS — I509 Heart failure, unspecified: Secondary | ICD-10-CM

## 2012-04-07 DIAGNOSIS — R079 Chest pain, unspecified: Secondary | ICD-10-CM

## 2012-04-07 DIAGNOSIS — R0602 Shortness of breath: Secondary | ICD-10-CM

## 2012-04-07 DIAGNOSIS — R930 Abnormal findings on diagnostic imaging of skull and head, not elsewhere classified: Secondary | ICD-10-CM

## 2012-04-07 DIAGNOSIS — I10 Essential (primary) hypertension: Secondary | ICD-10-CM

## 2012-04-07 DIAGNOSIS — J189 Pneumonia, unspecified organism: Principal | ICD-10-CM

## 2012-04-07 DIAGNOSIS — J9600 Acute respiratory failure, unspecified whether with hypoxia or hypercapnia: Secondary | ICD-10-CM

## 2012-04-07 DIAGNOSIS — G894 Chronic pain syndrome: Secondary | ICD-10-CM | POA: Diagnosis present

## 2012-04-07 HISTORY — DX: Other chronic pain: G89.29

## 2012-04-07 HISTORY — DX: Dorsalgia, unspecified: M54.9

## 2012-04-07 LAB — DIFFERENTIAL
Basophils Absolute: 0 10*3/uL (ref 0.0–0.1)
Eosinophils Relative: 0 % (ref 0–5)
Lymphocytes Relative: 3 % — ABNORMAL LOW (ref 12–46)
Lymphs Abs: 0.3 10*3/uL — ABNORMAL LOW (ref 0.7–4.0)
Monocytes Absolute: 0.7 10*3/uL (ref 0.1–1.0)
Monocytes Relative: 7 % (ref 3–12)
Neutro Abs: 9.8 10*3/uL — ABNORMAL HIGH (ref 1.7–7.7)

## 2012-04-07 LAB — COMPREHENSIVE METABOLIC PANEL
AST: 24 U/L (ref 0–37)
CO2: 29 mEq/L (ref 19–32)
Chloride: 100 mEq/L (ref 96–112)
Creatinine, Ser: 0.76 mg/dL (ref 0.50–1.35)
GFR calc Af Amer: >90 mL/min (ref >90)
GFR calc non Af Amer: 79 mL/min — ABNORMAL LOW (ref >90)
Glucose, Bld: 148 mg/dL — ABNORMAL HIGH (ref 70–99)
Total Bilirubin: 0.6 mg/dL (ref 0.3–1.2)

## 2012-04-07 LAB — CBC
HCT: 30.2 % — ABNORMAL LOW (ref 39.0–52.0)
Hemoglobin: 10.3 g/dL — ABNORMAL LOW (ref 13.0–17.0)
MCV: 106 fL — ABNORMAL HIGH (ref 78.0–100.0)
RBC: 2.85 MIL/uL — ABNORMAL LOW (ref 4.22–5.81)
RDW: 14.2 % (ref 11.5–15.5)
WBC: 10.9 10*3/uL — ABNORMAL HIGH (ref 4.0–10.5)

## 2012-04-07 MED ORDER — ACETAMINOPHEN 325 MG PO TABS
650.0000 mg | ORAL_TABLET | Freq: Once | ORAL | Status: AC
  Administered 2012-04-08: 650 mg via ORAL
  Filled 2012-04-07: qty 2

## 2012-04-07 MED ORDER — DEXTROSE 5 % IV SOLN
500.0000 mg | Freq: Once | INTRAVENOUS | Status: DC
  Filled 2012-04-07: qty 500

## 2012-04-07 MED ORDER — SODIUM CHLORIDE 0.9 % IV SOLN
Freq: Once | INTRAVENOUS | Status: AC
  Administered 2012-04-07: via INTRAVENOUS

## 2012-04-07 NOTE — ED Notes (Signed)
ZOX:[id number]<AV> Expected date:<BR> Expected time: 9:33 PM<BR> Means of arrival:<BR> Comments:<BR> M212  - 89yoM Fever, sob

## 2012-04-07 NOTE — ED Notes (Signed)
Per EMS - pt w/ generalized fatigue and tremors for the last few weeks, pt from home - pt w/ oral temp of 102.1 at home. Pt w/ congestion to rt chest - Spo2 88% on RA - pt on 2L O2 via [place] and now 96-98% spo2.

## 2012-04-07 NOTE — ED Notes (Signed)
Patient transported to X-ray 

## 2012-04-07 NOTE — ED Notes (Signed)
Pt from home - pt states pt lives independently however has home health assistance - pt denies cough, pain, or complaints at present. Pt states "ask my daughter" when pt asked why he is here. Per pt's daughter, pt has been experiencing increased fatigue and weakness, decreased appetite and febrile tonight w/ temp of 102.1. Pt A&Ox4 - hx of COPD and coarse crackles noted on auscultation to lung fields bilat.

## 2012-04-08 ENCOUNTER — Encounter (HOSPITAL_COMMUNITY): Payer: Self-pay | Admitting: Internal Medicine

## 2012-04-08 DIAGNOSIS — I059 Rheumatic mitral valve disease, unspecified: Secondary | ICD-10-CM

## 2012-04-08 DIAGNOSIS — J189 Pneumonia, unspecified organism: Secondary | ICD-10-CM | POA: Diagnosis present

## 2012-04-08 DIAGNOSIS — I509 Heart failure, unspecified: Secondary | ICD-10-CM

## 2012-04-08 DIAGNOSIS — D696 Thrombocytopenia, unspecified: Secondary | ICD-10-CM

## 2012-04-08 DIAGNOSIS — D649 Anemia, unspecified: Secondary | ICD-10-CM

## 2012-04-08 DIAGNOSIS — J9600 Acute respiratory failure, unspecified whether with hypoxia or hypercapnia: Secondary | ICD-10-CM | POA: Diagnosis present

## 2012-04-08 LAB — COMPREHENSIVE METABOLIC PANEL
AST: 22 U/L (ref 0–37)
Albumin: 3 g/dL — ABNORMAL LOW (ref 3.5–5.2)
Alkaline Phosphatase: 94 U/L (ref 39–117)
Chloride: 101 mEq/L (ref 96–112)
Potassium: 4.2 mEq/L (ref 3.5–5.1)
Total Bilirubin: 0.6 mg/dL (ref 0.3–1.2)

## 2012-04-08 LAB — CBC
Platelets: 262 10*3/uL (ref 150–400)
RDW: 14.2 % (ref 11.5–15.5)
WBC: 9.1 10*3/uL (ref 4.0–10.5)

## 2012-04-08 LAB — URINALYSIS, ROUTINE W REFLEX MICROSCOPIC
Bilirubin Urine: NEGATIVE
Hgb urine dipstick: NEGATIVE
Protein, ur: NEGATIVE mg/dL
Urobilinogen, UA: 1 mg/dL (ref 0.0–1.0)

## 2012-04-08 LAB — PROTIME-INR
INR: 4 — ABNORMAL HIGH (ref 0.00–1.49)
Prothrombin Time: 39.6 seconds — ABNORMAL HIGH (ref 11.6–15.2)

## 2012-04-08 MED ORDER — AZITHROMYCIN 500 MG IV SOLR
500.0000 mg | INTRAVENOUS | Status: DC
  Administered 2012-04-08: 500 mg via INTRAVENOUS

## 2012-04-08 MED ORDER — OXYCODONE HCL 10 MG PO TB12
10.0000 mg | ORAL_TABLET | Freq: Two times a day (BID) | ORAL | Status: DC
  Administered 2012-04-08 – 2012-04-10 (×5): 10 mg via ORAL
  Filled 2012-04-08 (×5): qty 1

## 2012-04-08 MED ORDER — GUAIFENESIN ER 600 MG PO TB12
600.0000 mg | ORAL_TABLET | Freq: Two times a day (BID) | ORAL | Status: DC
  Administered 2012-04-08 – 2012-04-10 (×6): 600 mg via ORAL
  Filled 2012-04-08 (×7): qty 1

## 2012-04-08 MED ORDER — DEXTROSE 5 % IV SOLN
1.0000 g | Freq: Once | INTRAVENOUS | Status: AC
  Administered 2012-04-08: 1 g via INTRAVENOUS
  Filled 2012-04-08: qty 10

## 2012-04-08 MED ORDER — HYDROCODONE-ACETAMINOPHEN 5-325 MG PO TABS
1.0000 | ORAL_TABLET | ORAL | Status: DC | PRN
  Administered 2012-04-08: 1 via ORAL
  Filled 2012-04-08: qty 1

## 2012-04-08 MED ORDER — FENTANYL 25 MCG/HR TD PT72
25.0000 ug | MEDICATED_PATCH | TRANSDERMAL | Status: DC
  Administered 2012-04-09: 25 ug via TRANSDERMAL
  Filled 2012-04-08: qty 1

## 2012-04-08 MED ORDER — ONDANSETRON HCL 4 MG PO TABS: 4.0000 mg | ORAL_TABLET | Freq: Four times a day (QID) | ORAL | Status: DC | PRN

## 2012-04-08 MED ORDER — ALBUTEROL SULFATE (5 MG/ML) 0.5% IN NEBU: 2.5000 mg | INHALATION_SOLUTION | RESPIRATORY_TRACT | Status: DC | PRN

## 2012-04-08 MED ORDER — SODIUM CHLORIDE 0.9 % IJ SOLN
3.0000 mL | Freq: Two times a day (BID) | INTRAMUSCULAR | Status: DC
  Administered 2012-04-08 – 2012-04-10 (×6): 3 mL via INTRAVENOUS

## 2012-04-08 MED ORDER — ASPIRIN 81 MG PO TBEC: 81.0000 mg | DELAYED_RELEASE_TABLET | Freq: Every day | ORAL | Status: DC

## 2012-04-08 MED ORDER — LEVOFLOXACIN 750 MG PO TABS
750.0000 mg | ORAL_TABLET | ORAL | Status: AC
  Administered 2012-04-08 – 2012-04-09 (×2): 750 mg via ORAL
  Filled 2012-04-08 (×2): qty 1

## 2012-04-08 MED ORDER — ALUM & MAG HYDROXIDE-SIMETH 200-200-20 MG/5ML PO SUSP: 30.0000 mL | Freq: Four times a day (QID) | ORAL | Status: DC | PRN

## 2012-04-08 MED ORDER — DEXTROSE 5 % IV SOLN: 1.0000 g | INTRAVENOUS | Status: DC

## 2012-04-08 MED ORDER — AMIODARONE HCL 200 MG PO TABS
200.0000 mg | ORAL_TABLET | Freq: Every day | ORAL | Status: DC
  Administered 2012-04-08 – 2012-04-10 (×3): 200 mg via ORAL
  Filled 2012-04-08 (×3): qty 1

## 2012-04-08 MED ORDER — DOXAZOSIN MESYLATE 4 MG PO TABS
4.0000 mg | ORAL_TABLET | Freq: Every day | ORAL | Status: DC
  Administered 2012-04-08 – 2012-04-09 (×2): 4 mg via ORAL
  Filled 2012-04-08 (×3): qty 1

## 2012-04-08 MED ORDER — MIRTAZAPINE 7.5 MG PO TABS
7.5000 mg | ORAL_TABLET | Freq: Every day | ORAL | Status: DC
  Administered 2012-04-08 – 2012-04-09 (×2): 7.5 mg via ORAL
  Filled 2012-04-08 (×3): qty 1

## 2012-04-08 MED ORDER — SENNA 8.6 MG PO TABS
1.0000 | ORAL_TABLET | Freq: Two times a day (BID) | ORAL | Status: DC
  Administered 2012-04-08 – 2012-04-10 (×6): 8.6 mg via ORAL
  Filled 2012-04-08 (×6): qty 1

## 2012-04-08 MED ORDER — ASPIRIN EC 81 MG PO TBEC
81.0000 mg | DELAYED_RELEASE_TABLET | Freq: Every day | ORAL | Status: DC
  Filled 2012-04-08: qty 1

## 2012-04-08 MED ORDER — ONDANSETRON HCL 4 MG/2ML IJ SOLN: 4.0000 mg | Freq: Four times a day (QID) | INTRAMUSCULAR | Status: DC | PRN

## 2012-04-08 MED ORDER — BISACODYL 5 MG PO TBEC
5.0000 mg | DELAYED_RELEASE_TABLET | Freq: Every day | ORAL | Status: DC | PRN
  Filled 2012-04-08: qty 1

## 2012-04-08 NOTE — Progress Notes (Signed)
ANTICOAGULATION CONSULT NOTE - Initial Consult  Pharmacy Consult for Coumaind Indication: atrial fibrillation  No Known Allergies  Patient Measurements:   Wt= 69.4  Vital Signs: Temp: 100.3 F (37.9 C) (06/25 2339) Temp src: Oral (06/25 2339) BP: 102/65 mmHg (06/25 2339) Pulse Rate: 104  (06/25 2339)  Labs:  Basename 04/07/12 2248  HGB 10.3*  HCT 30.2*  PLT 288  APTT --  LABPROT 39.6*  INR 4.00*  HEPARINUNFRC --  CREATININE 0.76  CKTOTAL --  CKMB --  TROPONINI --    The CrCl is unknown because both a height and weight (above a minimum accepted value) are required for this calculation.   Medical History: Past Medical History  Diagnosis Date  . Atrial fibrillation with slow ventricular response     Symptoms of weakness  . Paroxysmal atrial fibrillation     Hx of, currently on amiodarone load  . Syncope     Probably related to sinus pause, finally conversion from atrial fibrillation to sinus rhythm  . COPD (chronic obstructive pulmonary disease)   . Hypertension   . Abnormal CXR 07/06/2007; 06/15/2010    Eleveted R HD 07/06/2007 > no change 09/92/2011    Medications:  Scheduled:    . sodium chloride   Intravenous Once  . acetaminophen  650 mg Oral Once  . amiodarone  200 mg Oral Daily  . aspirin EC  81 mg Oral Daily  . azithromycin  500 mg Intravenous Q24H  . cefTRIAXone (ROCEPHIN)  IV  1 g Intravenous Once  . cefTRIAXone (ROCEPHIN)  IV  1 g Intravenous Q24H  . doxazosin  4 mg Oral QHS  . fentaNYL  25 mcg Transdermal Q72H  . guaiFENesin  600 mg Oral BID  . mirtazapine  7.5 mg Oral QHS  . oxyCODONE  10 mg Oral Q12H  . senna  1 tablet Oral BID  . sodium chloride  3 mL Intravenous Q12H  . DISCONTD: aspirin  81 mg Oral Daily  . DISCONTD: azithromycin (ZITHROMAX) 500 MG IVPB  500 mg Intravenous Once   Infusions:    Assessment: 78 yo admitted c/o of fever and SOB- pt is on chronic coumadin for A-fib.  Goal of Therapy:  INR 2-3    Plan:   INR =  4.  No Coumadin today.  Daily PT/INR.  Education.  Susanne Greenhouse R 04/08/2012,1:14 AM

## 2012-04-08 NOTE — Progress Notes (Signed)
Patient seen and examined by me.  Agree with plan to de-escalate abx.  Patient is agreeable to go to rehab/SNF for short term care and recovery.  Await PT eval. OOB to chair.  Hope for D/C when bed available.  Marlin Canary D.O.

## 2012-04-08 NOTE — Progress Notes (Signed)
CRITICAL VALUE ALERT  Critical value received: Blood Cultures drawn 6/25. Aerobic bottle with gram positive rods  Date of notification:  04/08/12  Time of notification:  1923  Critical value read back:yes  Nurse who received alert:  Ernst Breach RN    MD notified (1st page):  Donnamarie Poag  Time of first page:  1924  MD notified (2nd page): Donnamarie Poag  Time of second page: 2030  Responding MD:  Donnamarie Poag  Time MD responded: 2035

## 2012-04-08 NOTE — Progress Notes (Signed)
[facility] PROGRESS NOTE  DETROIT FRIEDEN YQM:[id number] DOB: 12/28/1921 DOA: 04/07/2012 PCP: No primary provider on file.  Brief narrative:  [name] is a 78 y.o. male, was brought by his daughter for weakness over the last 2 weeks, as well as well has been complaining of poor appetite and decreased by mouth intake, as well patient reports fever at home of 102, patient complains of cough for the last few days, reports its nonproductive, as well complaints of shortness of breath, mainly exertional, patient had fever 100.7 in ED, had a chest x-ray done which did show left lower lobe infiltrate, patient urinalysis was negative, blood cultures were sent, patient was started on IV Rocephin and Zithromax in ED for community-acquired pneumonia, hospitalist service we are requested to admit the patient, upon presentation patient was saturating at 88% where he required to be started on oxygen via nasal cannula,     Consultants:    Procedures:    Antibiotics:  Rocephin  Azithromycin  levaquin  HPI/Subjective: Sitting up in bed eating lunch. Denies pain/discomfort/sob. Somewhat frail appearing  Objective: Filed Vitals:   04/07/12 2339 04/08/12 0135 04/08/12 0155 04/08/12 0505  BP: 102/65  99/64 100/66  Pulse: 104 45 76 74  Temp: 100.3 F (37.9 C)  98.6 F (37 C) 98.4 F (36.9 C)  TempSrc: Oral  Oral Oral  Resp: 28 17 19 19   Height:   5\' 6"  (1.676 m)   Weight:   69.1 kg (152 lb 5.4 oz)   SpO2: 95% 99% 99% 98%    Intake/Output Summary (Last 24 hours) at 04/08/12 1325 Last data filed at 04/08/12 1000  Gross per 24 hour  Intake    730 ml  Output    475 ml  Net    255 ml    Exam:   General:  Awake alert oriented to self only. Frail appearing, poor dentition. NAD  Cardiovascular: irregularly irregular. No MGR trace LEE PPP  Respiratory: Normal effort somewhat shallow. Breath sounds somewhat distant with faint crackles bilateral bases  Abdomen: flat soft +BS  non-tender  Data Reviewed: Basic Metabolic Panel:  Lab 04/08/12 5284 04/07/12 2248  NA 138 137  K 4.2 3.9  CL 101 100  CO2 31 29  GLUCOSE 84 148*  BUN 21 23  CREATININE 0.74 0.76  CALCIUM 8.4 8.5  MG -- --  PHOS -- --   Liver Function Tests:  Lab 04/08/12 0449 04/07/12 2248  AST 22 24  ALT 21 20  ALKPHOS 94 102  BILITOT 0.6 0.6  PROT 5.9* 6.3  ALBUMIN 3.0* 3.3*   No results found for this basename: LIPASE:5,AMYLASE:5 in the last 168 hours No results found for this basename: AMMONIA:5 in the last 168 hours CBC:  Lab 04/08/12 0449 04/07/12 2248  WBC 9.1 10.9*  NEUTROABS -- 9.8*  HGB 9.6* 10.3*  HCT 29.1* 30.2*  MCV 107.4* 106.0*  PLT 262 288   Cardiac Enzymes: No results found for this basename: CKTOTAL:5,CKMB:5,CKMBINDEX:5,TROPONINI:5 in the last 168 hours BNP: No components found with this basename: POCBNP:5 CBG: No results found for this basename: GLUCAP:5 in the last 168 hours  No results found for this or any previous visit (from the past 240 hour(s)).   Studies: Dg Chest 2 View  04/07/2012  *RADIOLOGY REPORT*  Clinical Data: Fever, chest pain, and shortness of breath  CHEST - 2 VIEW  Comparison: 06/15/2010  Findings: Shallow inspiration.  Cardiac enlargement with mild prominence of pulmonary vascularity.  Suggestion of infiltration  in the left lung base which may be compatible with pneumonia. Interstitial fibrosis in the lungs with likely emphysematous change in the apices.  Calcified and tortuous aorta.  Degenerative changes in the thoracic spine.  Anterior wedging of mid thoracic vertebra stable since previous study.  IMPRESSION: Cardiac enlargement with mild pulmonary vascular congestion. Suggestion of infiltration in the left lung base.  Original Report Authenticated By: Marlon Pel, M.D.    Scheduled Meds:   . sodium chloride   Intravenous Once  . acetaminophen  650 mg Oral Once  . amiodarone  200 mg Oral Daily  . cefTRIAXone (ROCEPHIN)  IV  1  g Intravenous Once  . doxazosin  4 mg Oral QHS  . fentaNYL  25 mcg Transdermal Q72H  . guaiFENesin  600 mg Oral BID  . levofloxacin  750 mg Oral Daily  . mirtazapine  7.5 mg Oral QHS  . oxyCODONE  10 mg Oral Q12H  . senna  1 tablet Oral BID  . sodium chloride  3 mL Intravenous Q12H  . DISCONTD: aspirin  81 mg Oral Daily  . DISCONTD: aspirin EC  81 mg Oral Daily  . DISCONTD: azithromycin (ZITHROMAX) 500 MG IVPB  500 mg Intravenous Once  . DISCONTD: azithromycin  500 mg Intravenous Q24H  . DISCONTD: cefTRIAXone (ROCEPHIN)  IV  1 g Intravenous Q24H   Continuous Infusions:    Assessment/Plan: 1. community-acquired pneumonia. Improved. Blood cultures pending. Afebrile, WC WNL, non-toxic appearing. Started on  IV Rocephin and Zithromax but today much improved and  appetite good will transition to levaquin.   2. Acute respiratory failure likely related to #1 with possibility of CHF exacerbation has patient has worsening lower extremity edema positive JVD and evidence of volume overload on the chest x-ray. Initially lasix held secondary to patient's infection.  2-D echo yields mild LVH with EF 50-55%. Some mitral valve requrge.  proBNP 745. Sats 95-100 on 2L. Will attempt to wean. Will check sats on RA.    3. History of fibrillation. Currently rate controlled will continue on Imuran. INR 4. Coumadin per pharmacy. Holding for now. No s/sx bleeding.    4. Chronic pain syndrome. Continue with fentanyl patch and long acting OxyContin   5. CHF. Appeared overloaded on admission. Cardiologist Dr. Shirlee Latch and last documented visit 7/12.  2-D echo with EF 50-55% mild LVH. Lasix held initially due to infection. Will consider gentle diruesis given BP and crackles. Not on at home. Chart does indicate chronic dyspnea. Strict intake output, daily wts.   6. COPD: at baseline. Not on home oxygen. Will evaluate for need for home 02 given chronic dyspnea.   7. HTN : controlled but a little soft. Monitor  8.  Chronic back pain: at base line. Home pain meds.   Code Status full  Code Status: full Family Communication: daughter at bedside Disposition Plan:  Will likely need snf at discharge. Hopefully ready 24-48 hours.    Gwenyth Bender, MD  [facility] Pager [phone]  If 7PM-7AM, please contact night-coverage [email or web address] Password [email or web address] 04/08/2012, 1:25 PM   LOS: 1 day

## 2012-04-08 NOTE — Progress Notes (Signed)
PT. O2 SATS ARE 93% ON ROOM AIR AT REST, WILL CONTINUE TO MONITOR AND OBSERVE PT.

## 2012-04-08 NOTE — ED Provider Notes (Signed)
History     CSN: [id number]  Arrival date & time 04/07/12  2151   First MD Initiated Contact with Patient 04/07/12 2313      Chief Complaint  Patient presents with  . Fever  . Shortness of Breath    (Consider location/radiation/quality/duration/timing/severity/associated sxs/prior treatment) HPI 78 year old male presents to the emergency department with generalized weakness ongoing for several weeks with worsening today, fever to 102 at home, and cough. Patient lives alone. Daughter is here in the emergency bur with him today. She reports that he has home health that stops by a few times a week and helps him out. Tonight, the patient called her sounding distressed saying he needed help. Patient has been sitting in his arm chair all day too weak to get up to go the bathroom. EMS reports patient hypoxic on room air, 88%. Patient does not normally wear oxygen  Past Medical History  Diagnosis Date  . Atrial fibrillation with slow ventricular response     Symptoms of weakness  . Paroxysmal atrial fibrillation     Hx of, currently on amiodarone load  . Syncope     Probably related to sinus pause, finally conversion from atrial fibrillation to sinus rhythm  . COPD (chronic obstructive pulmonary disease)   . Hypertension   . Abnormal CXR 07/06/2007; 06/15/2010    Eleveted R HD 07/06/2007 > no change 09/92/2011    History reviewed. No pertinent past surgical history.  Family History  Problem Relation Age of Onset  . Heart disease Brother     Atrial fibrillation  . Atopy Neg Hx     History  Substance Use Topics  . Smoking status: Former Smoker -- 3.0 packs/day for 15 years    Quit date: 10/15/1963  . Smokeless tobacco: Not on file  . Alcohol Use: 3.5 oz/week    7 drink(s) per week      Review of Systems  All other systems reviewed and are negative.   other than listed in history of present illness  Allergies  Review of patient's allergies indicates no known  allergies.  Home Medications  No current outpatient prescriptions on file.  BP 99/64  Pulse 76  Temp 98.6 F (37 C) (Oral)  Resp 19  Ht 5\' 6"  (1.676 m)  Wt 152 lb 5.4 oz (69.1 kg)  BMI 24.59 kg/m2  SpO2 99%  Physical Exam  Nursing note and vitals reviewed. Constitutional: He is oriented to person, place, and time. He appears well-developed and well-nourished.       Elderly, frail  HENT:  Head: Normocephalic and atraumatic.  Nose: Nose normal.  Mouth/Throat: Oropharynx is clear and moist.  Eyes: Conjunctivae and EOM are normal. Pupils are equal, round, and reactive to light.  Neck: Normal range of motion. Neck supple. No JVD present. No tracheal deviation present. No thyromegaly present.  Cardiovascular: Normal rate, regular rhythm, normal heart sounds and intact distal pulses.  Exam reveals no gallop and no friction rub.   No murmur heard. Pulmonary/Chest: Effort normal. No stridor. No respiratory distress. He has no wheezes. He has rales (with crackles in bases). He exhibits no tenderness.  Abdominal: Soft. Bowel sounds are normal. He exhibits no distension and no mass. There is no tenderness. There is no rebound and no guarding.  Musculoskeletal: Normal range of motion. He exhibits no edema and no tenderness.  Lymphadenopathy:    He has no cervical adenopathy.  Neurological: He is oriented to person, place, and time. He exhibits normal muscle tone.  Coordination normal.  Skin: Skin is dry. No rash noted. No erythema. No pallor.  Psychiatric: He has a normal mood and affect. His behavior is normal. Judgment and thought content normal.    ED Course  Procedures (including critical care time)  Labs Reviewed  CBC - Abnormal; Notable for the following:    WBC 10.9 (*)     RBC 2.85 (*)     Hemoglobin 10.3 (*)     HCT 30.2 (*)     MCV 106.0 (*)     MCH 36.1 (*)     All other components within normal limits  DIFFERENTIAL - Abnormal; Notable for the following:    Neutrophils  Relative 90 (*)     Neutro Abs 9.8 (*)     Lymphocytes Relative 3 (*)     Lymphs Abs 0.3 (*)     All other components within normal limits  COMPREHENSIVE METABOLIC PANEL - Abnormal; Notable for the following:    Glucose, Bld 148 (*)     Albumin 3.3 (*)     GFR calc non Af Amer 79 (*)     All other components within normal limits  PROTIME-INR - Abnormal; Notable for the following:    Prothrombin Time 39.6 (*)     INR 4.00 (*)     All other components within normal limits  LACTIC ACID, PLASMA  URINALYSIS, ROUTINE W REFLEX MICROSCOPIC  CULTURE, BLOOD (ROUTINE X 2)  CULTURE, BLOOD (ROUTINE X 2)  URINE CULTURE  CBC  COMPREHENSIVE METABOLIC PANEL  PRO B NATRIURETIC PEPTIDE   Dg Chest 2 View  04/07/2012  *RADIOLOGY REPORT*  Clinical Data: Fever, chest pain, and shortness of breath  CHEST - 2 VIEW  Comparison: 06/15/2010  Findings: Shallow inspiration.  Cardiac enlargement with mild prominence of pulmonary vascularity.  Suggestion of infiltration in the left lung base which may be compatible with pneumonia. Interstitial fibrosis in the lungs with likely emphysematous change in the apices.  Calcified and tortuous aorta.  Degenerative changes in the thoracic spine.  Anterior wedging of mid thoracic vertebra stable since previous study.  IMPRESSION: Cardiac enlargement with mild pulmonary vascular congestion. Suggestion of infiltration in the left lung base.  Original Report Authenticated By: Marlon Pel, M.D.     1. Community acquired pneumonia       MDM  78 year old male with infiltrate seen on chest x-ray, along with fever shortness of breath and worsening cough seems consistent with community-acquired pneumonia. Patient with new oxygen requirement. We'll do to the hospital and start on azithromycin and Rocephin.        Olivia Mackie, MD 04/08/12 [phone]

## 2012-04-08 NOTE — Progress Notes (Signed)
*  PRELIMINARY RESULTS* Echocardiogram 2D Echocardiogram has been performed.  [name] 04/08/2012, 12:37 PM

## 2012-04-08 NOTE — H&P (Signed)
[facility]                                                                                    Patient Demographics  [name], is a 78 y.o. male  CSN: [id number]  MRN: [id number]  DOB - [birth year]  Admit Date - 04/07/2012  Outpatient Primary MD for the patient is No primary provider on file.   With History of -  Past Medical History  Diagnosis Date  . Atrial fibrillation with slow ventricular response     Symptoms of weakness  . Paroxysmal atrial fibrillation     Hx of, currently on amiodarone load  . Syncope     Probably related to sinus pause, finally conversion from atrial fibrillation to sinus rhythm  . COPD (chronic obstructive pulmonary disease)   . Hypertension   . Abnormal CXR 07/06/2007; 06/15/2010    Eleveted R HD 07/06/2007 > no change 09/92/2011      History reviewed. No pertinent past surgical history.  in for   Chief Complaint  Patient presents with  . Fever  . Shortness of Breath     HPI  [name]  is a 78 y.o. male, was brought by his daughter for weakness over the last 2 weeks, as well as well has been complaining of poor appetite and decreased by mouth intake, as well patient reports fever at home of 102, patient complains of cough for the last few days, reports its nonproductive, as well complaints of shortness of breath, mainly exertional, patient had fever 100.7 in ED, had a chest x-ray done which did show left lower lobe infiltrate, patient urinalysis was negative, blood cultures were sent, patient was started on IV Rocephin and Zithromax in ED for community-acquired pneumonia, hospitalist service we are requested to admit the patient, upon presentation patient was saturating at 88% where he required to be started on oxygen via nasal cannula,    Review of Systems    In addition to the HPI above,  Complaints of Fever-but no chills, No Headache, No changes with Vision or hearing, No problems swallowing food or Liquids, but  complains of poor appetite and decreased oral intake No Chest pain, but complains of Cough and Shortness of Breath, No Abdominal pain, Vommitting, Bowel movements are regular, complaints of nausea No Blood in stool or Urine, No dysuria, No new skin rashes or bruises, No new joints pains-aches,  No new weakness, tingling, numbness in any extremity, has worsening lower extremity edema No recent weight gain or loss, No polyuria, polydypsia or polyphagia, No significant Mental Stressors.  A full 10 point Review of Systems was done, except as stated above, all other Review of Systems were negative.   Social History History  Substance Use Topics  . Smoking status: Former Smoker -- 3.0 packs/day for 15 years    Quit date: 10/15/1963  . Smokeless tobacco: Not on file  . Alcohol Use: 3.5 oz/week    7 drink(s) per week   Date  Family History Family History  Problem Relation Age of Onset  . Heart disease Brother     Atrial fibrillation  . Atopy Neg Hx  Prior to Admission medications   Medication Sig Start Date End Date Taking? Authorizing Provider  amiodarone (PACERONE) 200 MG tablet Take 200 mg by mouth daily.    Yes Historical Provider, MD  aspirin 81 MG EC tablet Take 81 mg by mouth daily.     Yes Historical Provider, MD  doxazosin (CARDURA) 4 MG tablet Take 4 mg by mouth at bedtime.    Yes Historical Provider, MD  fentaNYL (DURAGESIC - DOSED MCG/HR) 25 MCG/HR Place 1 patch onto the skin every 3 (three) days.     Yes Historical Provider, MD  mirtazapine (REMERON) 7.5 MG tablet Take 1 tablet by mouth At bedtime.  01/15/11  Yes Historical Provider, MD  oxyCODONE (OXYCONTIN) 10 MG 12 hr tablet Take 10 mg by mouth every 12 (twelve) hours.   Yes Historical Provider, MD  warfarin (COUMADIN) 2 MG tablet Take 2 mg by mouth daily.    Yes Historical Provider, MD    No Known Allergies  Physical Exam  Vitals  Blood pressure 102/65, pulse 104, temperature 100.3 F (37.9 C),  temperature source Oral, resp. rate 28, SpO2 95.00%.   1. General frail elderly male lying in bed in NAD,    2. Normal affect and insight, Not Suicidal or Homicidal, Awake Alert, Oriented 3.  3. No F.N deficits, ALL C.Nerves Intact, Strength 5/5 all 4 extremities, Sensation intact all 4 extremities, Plantars down going.  4. Ears and Eyes appear Normal, Conjunctivae clear, PERRLA. Moist Oral Mucosa.  5. Supple Neck, 8cm JVD, No cervical lymphadenopathy appriciated, No Carotid Bruits.  6. Symmetrical Chest wall movement, Good air movement bilaterally, bibasilar rales, and significant scoliosis  7. irregular regular rhythm, No Gallops, Rubs or Murmurs, No Parasternal Heave.  8. Positive Bowel Sounds, Abdomen Soft, Non tender, No organomegaly appriciated,       No rebound -guarding or rigidity.  9.  No Cyanosis, Normal Skin Turgor, No Skin Rash or Bruise.  10. Good muscle tone,  joints appear normal , no effusions, Normal ROM. +2-3 lower extremity edema  11. No Palpable Lymph Nodes Neck or Axillae   Data Review  CBC  Lab 04/07/12 2248  WBC 10.9*  HGB 10.3*  HCT 30.2*  PLT 288  MCV 106.0*  MCH 36.1*  MCHC 34.1  RDW 14.2  LYMPHSABS 0.3*  MONOABS 0.7  EOSABS 0.0  BASOSABS 0.0  BANDABS --   ------------------------------------------------------------------------------------------------------------------  Chemistries   Lab 04/07/12 2248  NA 137  K 3.9  CL 100  CO2 29  GLUCOSE 148*  BUN 23  CREATININE 0.76  CALCIUM 8.5  MG --  AST 24  ALT 20  ALKPHOS 102  BILITOT 0.6   ------------------------------------------------------------------------------------------------------------------ CrCl is unknown because both a height and weight (above a minimum accepted value) are required for this calculation. ------------------------------------------------------------------------------------------------------------------ No results found for this basename:  TSH,T4TOTAL,FREET3,T3FREE,THYROIDAB in the last 72 hours   Coagulation profile No results found for this basename: INR:5,PROTIME:5 in the last 168 hours ------------------------------------------------------------------------------------------------------------------- No results found for this basename: DDIMER:2 in the last 72 hours -------------------------------------------------------------------------------------------------------------------  Cardiac Enzymes No results found for this basename: CK:3,CKMB:3,TROPONINI:3,MYOGLOBIN:3 in the last 168 hours ------------------------------------------------------------------------------------------------------------------ No components found with this basename: POCBNP:3   ---------------------------------------------------------------------------------------------------------------  Urinalysis    Component Value Date/Time   COLORURINE YELLOW 04/07/2012 2316   APPEARANCEUR CLEAR 04/07/2012 2316   LABSPEC 1.028 04/07/2012 2316   PHURINE 5.0 04/07/2012 2316   GLUCOSEU NEGATIVE 04/07/2012 2316   HGBUR NEGATIVE 04/07/2012 2316   BILIRUBINUR NEGATIVE 04/07/2012 2316  KETONESUR NEGATIVE 04/07/2012 2316   PROTEINUR NEGATIVE 04/07/2012 2316   UROBILINOGEN 1.0 04/07/2012 2316   NITRITE NEGATIVE 04/07/2012 2316   LEUKOCYTESUR NEGATIVE 04/07/2012 2316    ----------------------------------------------------------------------------------------------------------------   Imaging results:   Dg Chest 2 View  04/07/2012  *RADIOLOGY REPORT*  Clinical Data: Fever, chest pain, and shortness of breath  CHEST - 2 VIEW  Comparison: 06/15/2010  Findings: Shallow inspiration.  Cardiac enlargement with mild prominence of pulmonary vascularity.  Suggestion of infiltration in left lung base which may be compatible with pneumonia. Interstitial fibrosis in the lungs with likely emphysematous change in the apices.  Calcified and tortuous aorta.  Degenerative changes in the  thoracic spine.  Anterior wedging of mid thoracic vertebra stable since previous study.  IMPRESSION: Cardiac enlargement with mild pulmonary vascular congestion. Suggestion of infiltration in the left lung base.  Original Report Authenticated By: Marlon Pel, M.D.     Assessment & Plan  Principal Problem:  *Community acquired pneumonia Active Problems:  Atrial fibrillation  Acute respiratory failure  CHF (congestive heart failure)    1. community-acquired pneumonia. Patient is febrile blood cultures were sent will be started on IV Rocephin and Zithromax.  2. Acute respiratory failure this is secondary to pneumonia, and with possibility of CHF exacerbation has patient has worsening lower extremity edema positive JVD and evidence of volume overload on the chest x-ray will hold on starting Lasix secondary to patient's infection will check 2-D echo proBNP in a.m. and one point when he is more stable will start him on diuresis  3. History of fibrillation. Currently rate controlled will continue on Imuran and on anticoagulation  4. Chronic pain syndrome. Continue with fentanyl patch and long acting OxyContin  5. CHF. Patient is not to have history of breast CHF will check 2-D echo and will start patient on Lasix and his stable DVT Prophylaxis  Patient is already on warfarin  AM Labs Ordered, also please review Full Orders  Admission, patients condition and plan of care including tests being ordered have been discussed with the patient and daughter who indicate understanding and agree with the plan and Code Status.  Code Status full Condition GUARDED  Jaikob Borgwardt M.D on 04/08/2012 at 12:55 AM  Between 7am to 7pm - Pager - [phone]  After 7pm go to [email or web address] - password [email or web address]  And look for the night coverage person covering me after hours  [facility] Office  [phone]

## 2012-04-09 DIAGNOSIS — J9600 Acute respiratory failure, unspecified whether with hypoxia or hypercapnia: Secondary | ICD-10-CM

## 2012-04-09 DIAGNOSIS — D696 Thrombocytopenia, unspecified: Secondary | ICD-10-CM

## 2012-04-09 DIAGNOSIS — I509 Heart failure, unspecified: Secondary | ICD-10-CM

## 2012-04-09 DIAGNOSIS — D649 Anemia, unspecified: Secondary | ICD-10-CM

## 2012-04-09 LAB — BASIC METABOLIC PANEL
CO2: 32 mEq/L (ref 19–32)
Calcium: 8.6 mg/dL (ref 8.4–10.5)
Chloride: 103 mEq/L (ref 96–112)
Glucose, Bld: 85 mg/dL (ref 70–99)
Sodium: 139 mEq/L (ref 135–145)

## 2012-04-09 LAB — CBC
Hemoglobin: 10 g/dL — ABNORMAL LOW (ref 13.0–17.0)
MCH: 35.1 pg — ABNORMAL HIGH (ref 26.0–34.0)
MCV: 107.4 fL — ABNORMAL HIGH (ref 78.0–100.0)
RBC: 2.85 MIL/uL — ABNORMAL LOW (ref 4.22–5.81)

## 2012-04-09 LAB — CULTURE, BLOOD (ROUTINE X 2): Culture  Setup Time: 201306260426

## 2012-04-09 LAB — PROTIME-INR: Prothrombin Time: 35.1 seconds — ABNORMAL HIGH (ref 11.6–15.2)

## 2012-04-09 LAB — URINE CULTURE: Culture: NO GROWTH

## 2012-04-09 MED ORDER — WARFARIN - PHARMACIST DOSING INPATIENT: Freq: Every day | Status: DC

## 2012-04-09 NOTE — Clinical Social Work Placement (Unsigned)
     Clinical Social Work Department CLINICAL SOCIAL WORK PLACEMENT NOTE 04/09/2012  Patient:  [name], [name]  Account Number:  [id number] Admit date:  04/07/2012  Clinical Social Worker:  Becky Sax, LCSW  Date/time:  04/09/2012 12:00 M  Clinical Social Work is seeking post-discharge placement for this patient at the following level of care:   SKILLED NURSING   (*CSW will update this form in Epic as items are completed)   04/09/2012  Patient/family provided with [facility] Department of Clinical Social Works list of facilities offering this level of care within the geographic area requested by the patient (or if unable, by the patients family).  04/09/2012  Patient/family informed of their freedom to choose among providers that offer the needed level of care, that participate in Medicare, Medicaid or managed care program needed by the patient, have an available bed and are willing to accept the patient.  04/09/2012  Patient/family informed of [facility] ownership interest in [facility], as well as of the fact that they are under no obligation to receive care at this facility.  PASARR submitted to EDS on 04/09/2012 PASARR number received from EDS on 04/09/2012  FL2 transmitted to all facilities in geographic area requested by pt/family on  04/09/2012 FL2 transmitted to all facilities within larger geographic area on   Patient informed that his/her managed care company has contracts with or will negotiate with  certain facilities, including the following:     Patient/family informed of bed offers received:   Patient chooses bed at  Physician recommends and patient chooses bed at    Patient to be transferred to  on   Patient to be transferred to facility by   The following physician request were entered in Epic:   Additional Comments:

## 2012-04-09 NOTE — Clinical Social Work Psychosocial (Unsigned)
     Clinical Social Work Department BRIEF PSYCHOSOCIAL ASSESSMENT 04/09/2012  Patient:  [name], [name]     Account Number:  [id number]     Admit date:  04/07/2012  Clinical Social Worker:  Hattie Perch  Date/Time:  04/09/2012 12:00 M  Referred by:  Physician  Date Referred:  04/09/2012 Referred for  SNF Placement   Other Referral:   Interview type:  Patient Other interview type:   daughter    PSYCHOSOCIAL DATA Living Status:  ALONE Admitted from facility:   Level of care:   Primary support name:  [name] Primary support relationship to patient:  CHILD, ADULT Degree of support available:   good    CURRENT CONCERNS Current Concerns  Post-Acute Placement   Other Concerns:    SOCIAL WORK ASSESSMENT / PLAN CSW met with patient. patient is alert and oriented X3. patient is in need of SNF placement. patient is agreeable but would like [facility] has he has been there in the past.   Assessment/plan status:   Other assessment/ plan:   Information/referral to community resources:    PATIENTS/FAMILYS RESPONSE TO PLAN OF CARE: patient agreeable to SNF at [facility] but understands that back up choices are needed.

## 2012-04-09 NOTE — Progress Notes (Signed)
ANTICOAGULATION CONSULT NOTE - Follow Up Consult  Pharmacy Consult for warfarin Indication: atrial fibrillation  No Known Allergies  Patient Measurements: Height: 5\' 6"  (167.6 cm) Weight: 152 lb 1.9 oz (69 kg) IBW/kg (Calculated) : 63.8   Vital Signs: Temp: 98.1 F (36.7 C) (06/27 0630) Temp src: Oral (06/27 0630) BP: 121/76 mmHg (06/27 0630) Pulse Rate: 65  (06/27 0630)  Labs:  Basename 04/09/12 0455 04/08/12 0449 04/07/12 2248  HGB 10.0* 9.6* --  HCT 30.6* 29.1* 30.2*  PLT 240 262 288  APTT -- -- --  LABPROT 35.1* -- 39.6*  INR 3.43* -- 4.00*  HEPARINUNFRC -- -- --  CREATININE 0.68 0.74 0.76  CKTOTAL -- -- --  CKMB -- -- --  TROPONINI -- -- --    Estimated Creatinine Clearance: 57.6 ml/min (by C-G formula based on Cr of 0.68).   Assessment:  65 YOM admitted with CAP, on chronic coumadin PTA for atrial fibrillation.  Home warfarin regimen reported as 2 mg daily.  INR supratherapeutic on admission, trending down.  Levaquin started for treatment of CAP and may interact with warfarin, resulting in an increase in INR.  No warfarin has been provided inpatient yet.  No bleeding/complications reported.  Goal of Therapy:  INR 2-3   Plan:  No warfarin today. Follow up INR in AM.   Clance Boll 04/09/2012,8:49 AM

## 2012-04-09 NOTE — Evaluation (Signed)
Occupational Therapy Evaluation Patient Details Name: [name] MRN: [id number] DOB: Aug 01, 1922 Today's Date: 04/09/2012 Time: 4098-1191 OT Time Calculation (min): 32 min  OT Assessment / Plan / Recommendation Clinical Impression  Pt admitted for generalized weakness and community acquired pneumonia. Pt presents with decreased strength, functional mobility, safety and ADL independence. Will benefit from skilled OT services to improve independence with self care tasks.     OT Assessment  Patient needs continued OT Services    Follow Up Recommendations  Skilled nursing facility    Barriers to Discharge      Equipment Recommendations  Defer to next venue    Recommendations for Other Services    Frequency  Min 1X/week    Precautions / Restrictions Precautions Precautions: Fall        ADL  Eating/Feeding: Simulated;Independent Where Assessed - Eating/Feeding: Bed level Grooming: Performed;Wash/dry hands;Set up Where Assessed - Grooming: Supported sitting Upper Body Bathing: Simulated;Chest;Right arm;Left arm;Abdomen;Set up Where Assessed - Upper Body Bathing: Supported sitting Lower Body Bathing: Simulated;Minimal assistance Where Assessed - Lower Body Bathing: Supported sit to stand Upper Body Dressing: Simulated;Set up Where Assessed - Upper Body Dressing: Supported sitting Lower Body Dressing: Simulated;Minimal assistance;Other (comment) (for balance support, LOB posteriorly X1) Where Assessed - Lower Body Dressing: Supported sit to stand Toilet Transfer: Performed;Minimal assistance Toilet Transfer Method: Stand pivot;Other (comment) (pivot from EOB to [facility] ) Toilet Transfer Equipment: Bedside commode Toileting - Clothing Manipulation and Hygiene: Simulated;Minimal assistance;Other (comment) (requires balance support. Had LOB X1 posteriorly) Where Assessed - Toileting Clothing Manipulation and Hygiene: Sit to stand from 3-in-1 or toilet Tub/Shower Transfer Method: Not  assessed Equipment Used: Rolling walker ADL Comments: When pt finished on BSC, PT then came in and assisted from there for ambulation. Pt planning SNF    OT Diagnosis: Generalized weakness  OT Problem List: Decreased strength;Decreased knowledge of use of DME or AE OT Treatment Interventions: Self-care/ADL training;Therapeutic activities;DME and/or AE instruction;Patient/family education   OT Goals Acute Rehab OT Goals OT Goal Formulation: With patient Time For Goal Achievement: 04/23/12 Potential to Achieve Goals: Good ADL Goals Pt Will Perform Grooming: with supervision;Standing at sink ADL Goal: Grooming - Progress: Goal set today Pt Will Perform Lower Body Bathing: with supervision;Sit to stand from chair;Sit to stand from bed ADL Goal: Lower Body Bathing - Progress: Goal set today Pt Will Perform Lower Body Dressing: with supervision;Sit to stand from chair;Sit to stand from bed ADL Goal: Lower Body Dressing - Progress: Goal set today Pt Will Transfer to Toilet: with supervision;Ambulation;with DME;3-in-1 ADL Goal: Toilet Transfer - Progress: Goal set today Pt Will Perform Toileting - Clothing Manipulation: with supervision;Standing ADL Goal: Toileting - Clothing Manipulation - Progress: Goal set today  Visit Information  Last OT Received On: 04/09/12 Assistance Needed: +1    Subjective Data  Subjective: i need to have a bowel movement Patient Stated Goal: to be more independent   Prior Functioning  Home Living Lives With: Alone;Other (Comment) (has caregiver M,W,F 3 hours in am and 3 hours in pm) Available Help at Discharge: Skilled Nursing Facility Home Adaptive Equipment: Walker - rolling Additional Comments: uses RW; last fall about 9 months ago Prior Function Level of Independence: Independent with assistive device(s) Driving: Yes Vocation: Retired Musician: No difficulties Dominant Hand: Right    Cognition  Overall Cognitive Status:  Appears within functional limits for tasks assessed/performed Arousal/Alertness: Awake/alert Orientation Level: Appears intact for tasks assessed Behavior During Session: [facility] for tasks performed    Extremity/Trunk Assessment  Right Upper Extremity Assessment RUE ROM/Strength/Tone: [facility] for tasks assessed Left Upper Extremity Assessment LUE ROM/Strength/Tone: WFL for tasks assessed Right Lower Extremity Assessment RLE ROM/Strength/Tone: Within functional levels Left Lower Extremity Assessment LLE ROM/Strength/Tone: Within functional levels Trunk Assessment Trunk Assessment: Kyphotic (and forward head posture)   Mobility Bed Mobility Bed Mobility: Not assessed (at EOB with nursing when OT arrived) Transfers Transfers: Sit to Stand;Stand to Sit Sit to Stand: 4: Min assist;With upper extremity assist;From chair/3-in-1 Stand to Sit: 4: Min assist;With upper extremity assist;To chair/3-in-1 Details for Transfer Assistance: verbal cues for technique/safety      Balance Balance Balance Assessed: Yes Static Standing Balance Static Standing - Balance Support: During functional activity Static Standing - Comment/# of Minutes: per OT, pt lost balance posteriorly with hygiene Dynamic Standing Balance Dynamic Standing - Level of Assistance: 4: Min assist;Other (comment) (LOB X1 posteriorly with min assist to recover)  End of Session OT - End of Session Equipment Utilized During Treatment: Gait belt Activity Tolerance: Patient tolerated treatment well Patient left: Other (comment) (on [facility] with PT)  GO     Lennox Laity [phone] 04/09/2012, 11:43 AM

## 2012-04-09 NOTE — Progress Notes (Signed)
Patient seen and examined by me. Agree with note by Toya Smothers.  Seen by Pt/OT and SNF recommended.  Family and patient agreeable.  Hope for D/C soon.  Breathing stable  Marlin Canary D.O.

## 2012-04-09 NOTE — Evaluation (Signed)
Physical Therapy Evaluation Patient Details Name: [name] MRN: [id number] DOB: 04/03/1922 Today's Date: 04/09/2012 Time: 1308-6578 PT Time Calculation (min): 11 min  PT Assessment / Plan / Recommendation Clinical Impression  Pt admitted for generalized weakness and community acquired pneumonia.  Pt would benefit from acute PT services in order to improve safety and independence with transfers and ambulation.  Pt reports feeling very weak during functional activities.  SaO2 95% on room air upon checking after ambulation.  Pt reports he plans to d/c to SNF.    PT Assessment  Patient needs continued PT services    Follow Up Recommendations  Supervision for mobility/OOB;Skilled nursing facility    Barriers to Discharge        Equipment Recommendations  None recommended by PT    Recommendations for Other Services     Frequency Min 3X/week    Precautions / Restrictions Precautions Precautions: Fall   Pertinent Vitals/Pain No pain      Mobility  Transfers Transfers: Sit to Stand;Stand to Sit Sit to Stand: 4: Min assist;From chair/3-in-1;With armrests;With upper extremity assist Stand to Sit: 4: Min guard;To chair/3-in-1;With armrests;With upper extremity assist Details for Transfer Assistance: verbal cues for safe technique and keeping RW close Ambulation/Gait Ambulation/Gait Assistance: 4: Min guard Ambulation Distance (Feet): 60 Feet Assistive device: Rolling walker Ambulation/Gait Assistance Details: pt declined ambulation in hallway so ambulated around room, pt reports feeling safer with min/guard (decreases fear of falling) Gait Pattern: Step-through pattern;Decreased stride length    Exercises General Exercises - Lower Extremity Ankle Circles/Pumps: AROM;Both;20 reps;Supine Quad Sets: AROM;Both;20 reps;Supine Gluteal Sets: AROM;Both;20 reps;Supine   PT Diagnosis: Difficulty walking;Generalized weakness  PT Problem List: Decreased strength;Decreased activity  tolerance;Decreased mobility;Decreased knowledge of use of DME;Decreased safety awareness;Decreased balance PT Treatment Interventions: DME instruction;Gait training;Functional mobility training;Therapeutic exercise;Balance training;Therapeutic activities;Patient/family education;Neuromuscular re-education   PT Goals Acute Rehab PT Goals PT Goal Formulation: With patient Time For Goal Achievement: 04/16/12 Potential to Achieve Goals: Good Pt will go Supine/Side to Sit: with supervision PT Goal: Supine/Side to Sit - Progress: Goal set today Pt will go Sit to Supine/Side: with supervision PT Goal: Sit to Supine/Side - Progress: Goal set today Pt will go Sit to Stand: with supervision PT Goal: Sit to Stand - Progress: Goal set today Pt will go Stand to Sit: with supervision PT Goal: Stand to Sit - Progress: Goal set today Pt will Ambulate: >150 feet;with supervision;with least restrictive assistive device PT Goal: Ambulate - Progress: Goal set today  Visit Information  Last PT Received On: 04/09/12 Assistance Needed: +1    Subjective Data  Subjective: "My daughter Darl Pikes is supposed to come by."   Prior Functioning  Home Living Lives With: Alone;Other (Comment) (has caregiver M,W,F 3 hours in am and 3 hours in pm) Available Help at Discharge: Skilled Nursing Facility Home Adaptive Equipment: Walker - rolling Additional Comments: uses RW; last fall about 9 months ago Prior Function Level of Independence: Independent with assistive device(s) Driving: Yes Vocation: Retired Musician: No difficulties Dominant Hand: Right    Cognition  Overall Cognitive Status: Appears within functional limits for tasks assessed/performed Arousal/Alertness: Awake/alert Behavior During Session: [facility] for tasks performed    Extremity/Trunk Assessment Right Lower Extremity Assessment RLE ROM/Strength/Tone: Within functional levels Left Lower Extremity Assessment LLE  ROM/Strength/Tone: Within functional levels Trunk Assessment Trunk Assessment: Kyphotic (and forward head posture)   Balance Static Standing Balance Static Standing - Balance Support: During functional activity Static Standing - Comment/# of Minutes: per OT, pt lost  balance posteriorly with hygiene  End of Session PT - End of Session Equipment Utilized During Treatment: Gait belt Activity Tolerance: Patient tolerated treatment well Patient left: in chair;with call bell/phone within reach;with nursing in room  GP     [name],KATHrine E 04/09/2012, 11:10 AM Pager: [phone]

## 2012-04-09 NOTE — Progress Notes (Signed)
04/09/12 1959 Oxygen saturation was 97% on room air while patient was resting.

## 2012-04-09 NOTE — Progress Notes (Signed)
[facility] PROGRESS NOTE  JAHLIL ZILLER WUJ:[id number] DOB: [birth year] DOA: 04/07/2012 PCP: No primary provider on file.  Brief narrative: [name] is a 78 y.o. male, was brought by his daughter for weakness over the last 2 weeks, as well as well has been complaining of poor appetite and decreased by mouth intake, as well patient reports fever at home of 102, patient complains of cough. shortness of breath, mainly exertional, patient had fever 100.7 in ED, had a chest x-ray done which did show left lower lobe infiltrate. IV Rocephin and Zithromax in ED for community-acquired pneumonia. Upon presentation patient was saturating at 88% where he required to be started on oxygen via nasal cannula   Consultants:  Procedures:    Antibiotics:  Rocephin   Azithromycin  Transitioned to levaquin 6/27.  HPI/Subjective: Up in chair. Alert, oriented to self only. Denies pain/discomfort. No events during night.   Objective: Filed Vitals:   04/08/12 0505 04/08/12 1336 04/08/12 2105 04/09/12 0630  BP: 100/66 99/62 128/76 121/76  Pulse: 74 60 92 65  Temp: 98.4 F (36.9 C) 98 F (36.7 C) 99 F (37.2 C) 98.1 F (36.7 C)  TempSrc: Oral Oral Oral Oral  Resp: 19 19 18 18   Height:      Weight:    69 kg (152 lb 1.9 oz)  SpO2: 98% 100% 94% 96%    Intake/Output Summary (Last 24 hours) at 04/09/12 1059 Last data filed at 04/09/12 0900  Gross per 24 hour  Intake    723 ml  Output   1425 ml  Net   -702 ml    Exam:   General:  Up in chair, alert, NAD  Cardiovascular: irregularly irregular, No MGR, no LEE, PPP  Respiratory: normal effort. Breath sounds without wheeze, rhonchi.  Abdomen: flat soft +BS  Data Reviewed: Basic Metabolic Panel:  Lab 04/09/12 9562 04/08/12 0449 04/07/12 2248  NA 139 138 137  K 4.3 4.2 3.9  CL 103 101 100  CO2 32 31 29  GLUCOSE 85 84 148*  BUN 18 21 23   CREATININE 0.68 0.74 0.76  CALCIUM 8.6 8.4 8.5  MG -- -- --  PHOS -- -- --   Liver  Function Tests:  Lab 04/08/12 0449 04/07/12 2248  AST 22 24  ALT 21 20  ALKPHOS 94 102  BILITOT 0.6 0.6  PROT 5.9* 6.3  ALBUMIN 3.0* 3.3*   No results found for this basename: LIPASE:5,AMYLASE:5 in the last 168 hours No results found for this basename: AMMONIA:5 in the last 168 hours CBC:  Lab 04/09/12 0455 04/08/12 0449 04/07/12 2248  WBC 5.2 9.1 10.9*  NEUTROABS -- -- 9.8*  HGB 10.0* 9.6* 10.3*  HCT 30.6* 29.1* 30.2*  MCV 107.4* 107.4* 106.0*  PLT 240 262 288   Cardiac Enzymes: No results found for this basename: CKTOTAL:5,CKMB:5,CKMBINDEX:5,TROPONINI:5 in the last 168 hours BNP: No components found with this basename: POCBNP:5 CBG: No results found for this basename: GLUCAP:5 in the last 168 hours  Recent Results (from the past 240 hour(s))  CULTURE, BLOOD (ROUTINE X 2)     Status: Normal (Preliminary result)   Collection Time   04/07/12 10:43 PM      Component Value Range Status Comment   Specimen Description BLOOD RIGHT ARM   Final    Special Requests BOTTLES DRAWN AEROBIC AND ANAEROBIC [facility]   Final    Culture  Setup Time [id number]   Final    Culture     Final  Value:        BLOOD CULTURE RECEIVED NO GROWTH TO DATE CULTURE WILL BE HELD FOR 5 DAYS BEFORE ISSUING A FINAL NEGATIVE REPORT   Report Status PENDING   Incomplete   CULTURE, BLOOD (ROUTINE X 2)     Status: Normal   Collection Time   04/07/12 10:48 PM      Component Value Range Status Comment   Specimen Description BLOOD RIGHT ANTECUBITAL   Final    Special Requests BOTTLES DRAWN AEROBIC AND ANAEROBIC 5CC   Final    Culture  Setup Time [id number]   Final    Culture     Final    Value: BACILLUS SPECIES     Note: Standardized susceptibility testing for this organism is not available.     Note: Gram Stain Report Called to,Read Back By and Verified With: MARISSA LONG @ 1916 ON 04/08/12 BY GOLLD   Report Status 04/09/2012 FINAL   Final   URINE CULTURE     Status: Normal   Collection Time   04/07/12 11:16  PM      Component Value Range Status Comment   Specimen Description URINE, CLEAN CATCH   Final    Special Requests NONE   Final    Culture  Setup Time [id number]   Final    Colony Count NO GROWTH   Final    Culture NO GROWTH   Final    Report Status 04/09/2012 FINAL   Final      Studies: Dg Chest 2 View  04/07/2012  *RADIOLOGY REPORT*  Clinical Data: Fever, chest pain, and shortness of breath  CHEST - 2 VIEW  Comparison: 06/15/2010  Findings: Shallow inspiration.  Cardiac enlargement with mild prominence of pulmonary vascularity.  Suggestion of infiltration in the left lung base which may be compatible with pneumonia. Interstitial fibrosis in the lungs with likely emphysematous change in the apices.  Calcified and tortuous aorta.  Degenerative changes in the thoracic spine.  Anterior wedging of mid thoracic vertebra stable since previous study.  IMPRESSION: Cardiac enlargement with mild pulmonary vascular congestion. Suggestion of infiltration in the left lung base.  Original Report Authenticated By: Marlon Pel, M.D.    Scheduled Meds:   . amiodarone  200 mg Oral Daily  . doxazosin  4 mg Oral QHS  . fentaNYL  25 mcg Transdermal Q72H  . guaiFENesin  600 mg Oral BID  . levofloxacin  750 mg Oral Q24H  . mirtazapine  7.5 mg Oral QHS  . oxyCODONE  10 mg Oral Q12H  . senna  1 tablet Oral BID  . sodium chloride  3 mL Intravenous Q12H  . Warfarin - Pharmacist Dosing Inpatient   Does not apply q1800  . DISCONTD: azithromycin  500 mg Intravenous Q24H  . DISCONTD: cefTRIAXone (ROCEPHIN)  IV  1 g Intravenous Q24H   Continuous Infusions:    Assessment/Plan: 1. community-acquired pneumonia. Improved. Blood cultures one with no growth, one with gm+ rods, likely contaminant. Afebrile, non-toxic appearing. WC WNL.  appetite good. continue levaquin day #2    2. Acute respiratory failure likely related to #1 with possibility of CHF exacerbation has patient has worsening lower extremity  edema positive JVD and evidence of volume overload on the chest x-ray. Initially lasix held secondary to patient's infection. 2-D echo yields mild LVH with EF 50-55%. Some mitral valve requrge. Sats 95-99 on room air.   3. History of fibrillation. Currently rate controlled will continue on Imuran. INR 3.4. Coumadin per  pharmacy. Holding for now. No s/sx bleeding.  4. Chronic pain syndrome. Continue with fentanyl patch and long acting OxyContin   5. CHF. Appeared overloaded on admission. Cardiologist Dr. Shirlee Latch and last documented visit 7/12. 2-D echo with EF 50-55% mild LVH. Lasix held initially due to infection.  Not on at home. Chart does indicate chronic dyspnea. Does not appear overloaded currently. Volume status - . Wt stable at 69kg.  Strict intake output, daily wts.   6. COPD: at baseline. Not on home oxygen. Will evaluate for need for home 02 given chronic dyspnea.  7. HTN : controlled SBP range 99-128. Monitor  8. Chronic back pain: at base line. Home pain meds.  9. Dispo: medically stable and ready for discharge to facility. Family working with SW to arrange.  Code Status full   Code Status: full Family Communication: daughter Disposition Plan: medically stable and ready for discharge to facility. Family working with SW to arrange.    Gwenyth Bender, MD  [facility] Pager [phone]  If 7PM-7AM, please contact night-coverage [email or web address] Password [email or web address] 04/09/2012, 10:59 AM   LOS: 2 days

## 2012-04-09 NOTE — Clinical Documentation Improvement (Signed)
CHF DOCUMENTATION CLARIFICATION QUERY  THIS DOCUMENT IS NOT A PERMANENT PART OF THE MEDICAL RECORD  TO RESPOND TO THE THIS QUERY, FOLLOW THE INSTRUCTIONS BELOW:  1. If needed, update documentation for the patient's encounter via the notes activity.  2. Access this query again and click edit on the In [employer].  3. After updating, or not, click F2 to complete all highlighted (required) fields concerning your review. Select "additional documentation in the medical record" OR "no additional documentation provided".  4. Click Sign note button.  5. The deficiency will fall out of your In Basket *Please let us know if you are not able to complete this workflow by phone or e-mail (listed below).  Please update your documentation within the medical record to reflect your response to this query.                                                                                    04/09/12  Dear Dollene Cleveland, [facility],  In a better effort to capture your patient's severity of illness, reflect appropriate length of stay and utilization of resources, a review of the patient medical record has revealed the following indicators the diagnosis of Heart Failure.    Based on your clinical judgment, please clarify and document in a progress note and/or discharge summary the clinical condition associated with the following supporting information:  In responding to this query please exercise your independent judgment.  The fact that a query is asked, does not imply that any particular answer is desired or expected.   Pt admitted with CAP.  According to H/P pt with CHF    Please specify the acuity and type of CHF for this admission and document in pn and d/c summary  Thank you for all that you do for our patients!   Possible Clinical Conditions?  CHF  Chronic Systolic Congestive Heart Failure Chronic Diastolic Congestive Heart Failure Chronic Systolic & Diastolic Congestive Heart Failure Acute  Systolic Congestive Heart Failure Acute Diastolic Congestive Heart Failure Acute Systolic & Diastolic Congestive Heart Failure Acute on Chronic Systolic Congestive Heart Failure Acute on Chronic Diastolic Congestive Heart Failure Acute on Chronic Systolic & Diastolic  Congestive Heart Failure Other Condition________________________________________ Cannot Clinically Determine  Supporting Information:  Risk Factors: CAP, Acute Resp Failure, CHF,  Signs & Symptoms:  HP CHF will check 2-D echo and will start patient on Lasix and his stable Acute respiratory failure this is secondary to pneumonia, and with possibility of CHF exacerbation has patient has worsening lower extremity edema positive JVD and evidence of volume overload on the chest x-ray will hold on starting Lasix secondary to patient's infection will check 2-D echo proBNP in a.m. and one point when he is more stable will start him on diuresis   Diagnostics: 2 D Echo The estimated   ejection fraction was in the range of 50% to 55%.   CXR 6/25/13IMPRESSION: Cardiac enlargement with mild pulmonary vascular congestion. Suggestion of infiltration in the left lung base   Treatment: Monitoring  Reviewed: additional documentation in the medical record  Thank You,  Enis Slipper RN, BSN, CCDS Clinical Documentation Specialist [facility] Dept Pager: [phone]  Health Information Management [place]

## 2012-04-10 DIAGNOSIS — D696 Thrombocytopenia, unspecified: Secondary | ICD-10-CM

## 2012-04-10 DIAGNOSIS — J9600 Acute respiratory failure, unspecified whether with hypoxia or hypercapnia: Secondary | ICD-10-CM

## 2012-04-10 DIAGNOSIS — I509 Heart failure, unspecified: Secondary | ICD-10-CM

## 2012-04-10 DIAGNOSIS — D649 Anemia, unspecified: Secondary | ICD-10-CM

## 2012-04-10 LAB — PROTIME-INR: INR: 2.52 — ABNORMAL HIGH (ref 0.00–1.49)

## 2012-04-10 MED ORDER — FENTANYL 25 MCG/HR TD PT72: 1.0000 | MEDICATED_PATCH | TRANSDERMAL | Status: DC

## 2012-04-10 MED ORDER — GUAIFENESIN ER 600 MG PO TB12: 600.0000 mg | ORAL_TABLET | Freq: Two times a day (BID) | ORAL | Status: DC

## 2012-04-10 MED ORDER — LEVOFLOXACIN 750 MG PO TABS: ORAL_TABLET | ORAL | Status: DC

## 2012-04-10 MED ORDER — OXYCODONE HCL 10 MG PO TB12: 10.0000 mg | ORAL_TABLET | Freq: Two times a day (BID) | ORAL | Status: DC

## 2012-04-10 MED ORDER — ALUM & MAG HYDROXIDE-SIMETH 200-200-20 MG/5ML PO SUSP: 30.0000 mL | Freq: Four times a day (QID) | ORAL | Status: AC | PRN

## 2012-04-10 MED ORDER — SENNA 8.6 MG PO TABS: 1.0000 | ORAL_TABLET | Freq: Two times a day (BID) | ORAL | Status: DC

## 2012-04-10 MED ORDER — WARFARIN SODIUM 1 MG PO TABS
1.0000 mg | ORAL_TABLET | Freq: Once | ORAL | Status: DC
  Filled 2012-04-10: qty 1

## 2012-04-10 MED ORDER — BISACODYL 5 MG PO TBEC: 5.0000 mg | DELAYED_RELEASE_TABLET | Freq: Every day | ORAL | Status: AC | PRN

## 2012-04-10 MED ORDER — WARFARIN SODIUM 1 MG PO TABS: ORAL_TABLET | ORAL | Status: DC

## 2012-04-10 MED ORDER — LEVOFLOXACIN 750 MG PO TABS
750.0000 mg | ORAL_TABLET | ORAL | Status: DC
  Filled 2012-04-10: qty 1

## 2012-04-10 NOTE — Progress Notes (Signed)
Patient cleared for discharge. Patient has bed at [facility]. Packet copied and placed in [place]. ptar called for transportation. Daughter meeting patient at facility.  Paiton [place] C. Priest Lockridge MSW, LCSW [phone]

## 2012-04-10 NOTE — Care Management Note (Signed)
    Page 1 of 1   04/10/2012     3:25:58 PM   CARE MANAGEMENT NOTE 04/10/2012  Patient:  [name], [name]   Account Number:  [id number]  Date Initiated:  04/10/2012  Documentation initiated by:  Lanier Clam  Subjective/Objective Assessment:   ADMITTED W/PE     Action/Plan:   FROM HOME   Anticipated DC Date:  04/10/2012   Anticipated DC Plan:  SKILLED NURSING FACILITY  In-house referral  Clinical Social Worker      DC Planning Services  CM consult      Choice offered to / List presented to:             Status of service:  Completed, signed off Medicare Important Message given?  NA - LOS <3 / Initial given by admissions (If response is "NO", the following Medicare IM given date fields will be blank) Date Medicare IM given:   Date Additional Medicare IM given:    Discharge Disposition:  SKILLED NURSING FACILITY  Per UR Regulation:  Reviewed for med. necessity/level of care/duration of stay  If discussed at Long Length of Stay Meetings, dates discussed:    Comments:

## 2012-04-10 NOTE — Progress Notes (Addendum)
ANTICOAGULATION CONSULT NOTE - Follow Up Consult  Pharmacy Consult for warfarin Indication: atrial fibrillation  No Known Allergies  Patient Measurements: Height: 5\' 6"  (167.6 cm) Weight: 152 lb 5.4 oz (69.1 kg) IBW/kg (Calculated) : 63.8   Vital Signs: Temp: 98.3 F (36.8 C) (06/28 0633) Temp src: Oral (06/28 0633) BP: 136/84 mmHg (06/28 1610) Pulse Rate: 70  (06/28 0633)  Labs:  Basename 04/10/12 0446 04/09/12 0455 04/08/12 0449 04/07/12 2248  HGB -- 10.0* 9.6* --  HCT -- 30.6* 29.1* 30.2*  PLT -- 240 262 288  APTT -- -- -- --  LABPROT 27.6* 35.1* -- 39.6*  INR 2.52* 3.43* -- 4.00*  HEPARINUNFRC -- -- -- --  CREATININE -- 0.68 0.74 0.76  CKTOTAL -- -- -- --  CKMB -- -- -- --  TROPONINI -- -- -- --    Estimated Creatinine Clearance: 57.6 ml/min (by C-G formula based on Cr of 0.68).   Assessment:  [name] admitted with CAP, on chronic coumadin PTA for atrial fibrillation.  Home warfarin regimen reported as 2 mg daily.  INR supratherapeutic on admission, trending down, now within therapeutic range.  Levaquin started for treatment of CAP and may interact with warfarin, resulting in an increase in INR.  Will restart warfarin at lower dose d/t interaction.  No bleeding/complications reported.  Goal of Therapy:  INR 2-3   Plan:  Warfarin 1 mg once today. Follow up INR in AM.   Clance Boll 04/10/2012,8:49 AM

## 2012-04-10 NOTE — Discharge Summary (Addendum)
Physician Discharge Summary  Patient ID: KASSIDY FRANKSON MRN: [id number] DOB/AGE: May 22, 1922 78 y.o.  Admit date: 04/07/2012 Discharge date: 04/10/2012  Primary Care Physician:  No primary provider on file.   Discharge Diagnoses:    Principal Problem:  *Community acquired pneumonia Active Problems:  Atrial fibrillation  Acute respiratory failure  CHF (congestive heart failure)   Medication List  As of 04/10/2012 11:51 AM   TAKE these medications         alum & mag hydroxide-simeth 200-200-20 MG/5ML suspension   Commonly known as: MAALOX/MYLANTA   Take 30 mLs by mouth every 6 (six) hours as needed (dyspepsia).      amiodarone 200 MG tablet   Commonly known as: PACERONE   Take 200 mg by mouth daily.      aspirin 81 MG EC tablet   Take 81 mg by mouth daily.      bisacodyl 5 MG EC tablet   Commonly known as: DULCOLAX   Take 1 tablet (5 mg total) by mouth daily as needed.      doxazosin 4 MG tablet   Commonly known as: CARDURA   Take 4 mg by mouth at bedtime.      fentaNYL 25 MCG/HR   Commonly known as: DURAGESIC - dosed mcg/hr   Place 1 patch onto the skin every 3 (three) days.      fentaNYL 25 MCG/HR   Commonly known as: DURAGESIC - dosed mcg/hr   Place 1 patch (25 mcg total) onto the skin every 3 (three) days.      guaiFENesin 600 MG 12 hr tablet   Commonly known as: MUCINEX   Take 1 tablet (600 mg total) by mouth 2 (two) times daily.      levofloxacin 750 MG tablet   Commonly known as: LEVAQUIN   For 3 more days, last dose 04/13/12      mirtazapine 7.5 MG tablet   Commonly known as: REMERON   Take 1 tablet by mouth At bedtime.      oxyCODONE 10 MG 12 hr tablet   Commonly known as: OXYCONTIN   Take 1 tablet (10 mg total) by mouth every 12 (twelve) hours.      senna 8.6 MG Tabs   Commonly known as: SENOKOT   Take 1 tablet (8.6 mg total) by mouth 2 (two) times daily.      warfarin 1 MG tablet   Commonly known as: COUMADIN   For 3 days with INR check 04/13/12.  Dose adjusted at that time             Disposition and Follow-up: Pt medically stable and ready for discharge to facility. Will need INR and coumadin dosing 04/13/12.  Follow-up Information    Follow up with Ginette Otto, MD. Schedule an appointment as soon as possible for a visit in 7 days.   Contact information:   [address] [place] [address] [place] [address] [phone]          Consults:  None  Physical exam:  General: Up in chair, alert, NAD  Cardiovascular: irregularly irregular, No MGR, no LEE, PPP  Respiratory: normal effort. Breath sounds without wheeze, rhonchi.  Abdomen: flat soft +BS   Significant Diagnostic Studies:  Dg Chest 2 View  04/07/2012  *RADIOLOGY REPORT*  Clinical Data: Fever, chest pain, and shortness of breath  CHEST - 2 VIEW  Comparison: 06/15/2010  Findings: Shallow inspiration.  Cardiac enlargement with mild prominence of pulmonary vascularity.  Suggestion of  infiltration in the left lung base which may be compatible with pneumonia. Interstitial fibrosis in the lungs with likely emphysematous change in the apices.  Calcified and tortuous aorta.  Degenerative changes in the thoracic spine.  Anterior wedging of mid thoracic vertebra stable since previous study.  IMPRESSION: Cardiac enlargement with mild pulmonary vascular congestion. Suggestion of infiltration in the left lung base.  Original Report Authenticated By: Marlon Pel, M.D.    Labs Reviewed  CBC - Abnormal; Notable for the following:    WBC 10.9 (*)     RBC 2.85 (*)     Hemoglobin 10.3 (*)     HCT 30.2 (*)     MCV 106.0 (*)     MCH 36.1 (*)     All other components within normal limits  DIFFERENTIAL - Abnormal; Notable for the following:    Neutrophils Relative 90 (*)     Neutro Abs 9.8 (*)     Lymphocytes Relative 3 (*)     Lymphs Abs 0.3 (*)     All other components within normal limits  COMPREHENSIVE METABOLIC PANEL - Abnormal; Notable for the  following:    Glucose, Bld 148 (*)     Albumin 3.3 (*)     GFR calc non Af Amer 79 (*)     All other components within normal limits  PROTIME-INR - Abnormal; Notable for the following:    Prothrombin Time 39.6 (*)     INR 4.00 (*)     All other components within normal limits  CBC - Abnormal; Notable for the following:    RBC 2.71 (*)     Hemoglobin 9.6 (*)     HCT 29.1 (*)     MCV 107.4 (*)     MCH 35.4 (*)     All other components within normal limits  COMPREHENSIVE METABOLIC PANEL - Abnormal; Notable for the following:    Total Protein 5.9 (*)     Albumin 3.0 (*)     GFR calc non Af Amer 80 (*)     All other components within normal limits  PRO B NATRIURETIC PEPTIDE - Abnormal; Notable for the following:    Pro B Natriuretic peptide (BNP) 742.0 (*)     All other components within normal limits  PROTIME-INR - Abnormal; Notable for the following:    Prothrombin Time 35.1 (*)     INR 3.43 (*)     All other components within normal limits  CBC - Abnormal; Notable for the following:    RBC 2.85 (*)     Hemoglobin 10.0 (*)     HCT 30.6 (*)     MCV 107.4 (*)     MCH 35.1 (*)     All other components within normal limits  BASIC METABOLIC PANEL - Abnormal; Notable for the following:    GFR calc non Af Amer 83 (*)     All other components within normal limits  PROTIME-INR - Abnormal; Notable for the following:    Prothrombin Time 27.6 (*)     INR 2.52 (*)     All other components within normal limits  LACTIC ACID, PLASMA  CULTURE, BLOOD (ROUTINE X 2)  CULTURE, BLOOD (ROUTINE X 2)  URINALYSIS, ROUTINE W REFLEX MICROSCOPIC  URINE CULTURE        Dg Chest 2 View  04/07/2012  *RADIOLOGY REPORT*  Clinical Data: Fever, chest pain, and shortness of breath  CHEST - 2 VIEW  Comparison: 06/15/2010  Findings: Shallow  inspiration.  Cardiac enlargement with mild prominence of pulmonary vascularity.  Suggestion of infiltration in the left lung base which may be compatible with  pneumonia. Interstitial fibrosis in the lungs with likely emphysematous change in the apices.  Calcified and tortuous aorta.  Degenerative changes in the thoracic spine.  Anterior wedging of mid thoracic vertebra stable since previous study.  IMPRESSION: Cardiac enlargement with mild pulmonary vascular congestion. Suggestion of infiltration in the left lung base.  Original Report Authenticated By: Marlon Pel, M.D.       Brief H and P: For complete details please refer to admission H and P, but in brief  Iasiah Ozment is a 78 y.o. male, was brought by his daughter on 04/08/12 for weakness over the last 2 weeks, as well as well has been complaining of poor appetite and decreased by mouth intake, as well patient reports fever at home of 102, patient complains of cough for the last few days, reports its nonproductive, as well complaints of shortness of breath, mainly exertional, patient had fever 100.7 in ED, had a chest x-ray done which did show left lower lobe infiltrate, patient urinalysis was negative, blood cultures were sent, patient was started on IV Rocephin and Zithromax in ED for community-acquired pneumonia, hospitalist service we are requested to admit the patient, upon presentation patient was saturating at 88% where he required to be started on oxygen via nasal cannula,    Hospital Course:   Principal Problem:  *Community acquired pneumonia Active Problems:  Atrial fibrillation  Acute respiratory failure  CHF (congestive heart failure) 1. community-acquired pneumonia. Pt admitted to tele. Quickly improved with IV fluids and antibiotics.  Blood cultures one with no growth, one with gm+ rods, likely contaminant. Pt remained afebrile and , non-toxic appearing. WC WNL. appetite good. Initially given Rocephin and Azithromycin that was changed to po levaquin. Will be discharged on po levaquin for 3 more days.   2. Acute respiratory failure likely related to #1 with possibility of CHF  exacerbation has patient has worsening lower extremity edema positive JVD and evidence of volume overload on the chest x-ray on admission. Initially lasix held secondary to patient's infection. 2-D echo yields mild LVH with EF 50-55%. Some mitral valve requrge. Sats 95-99 on room air. Wt stable 69.1. No signs overload at discharge.   3. History of fibrillation. Currently rate controlled will continue on Imuran. INR supratherapeutic on admission. Coumadin held. On day of discharge INR 2.5. Will resume coumadin 1mg  for 3 more days. Will need INR checked 04/13/12 and coumadin dosed accordingly.   4. Chronic pain syndrome. Continue with fentanyl patch and long acting OxyContin  5. Acute on chronic CHF ex. Appeared overloaded on admission. Cardiologist Dr. Shirlee Latch and last documented visit 7/12. 2-D echo with EF 50-55% mild LVH. Lasix held initially due to infection. Not on at home. Chart does indicate chronic dyspnea. Does not appear overloaded currently. Volume status -1.1L Wt.stable.   Marland Kitchen COPD: at baseline. Not on home oxygen. sats >90 on room air  7. HTN : controlled SBP range 99-128.   8. Chronic back pain: at base line. Home pain meds.    Time spent on Discharge: 35 minutes  Signed: Gwenyth Bender 04/10/2012, 11:51 AM

## 2012-04-10 NOTE — Discharge Summary (Signed)
Patient seen and examined by me.  Agree with note by Toya Smothers.  Plan to D/C to SNF for short term rehab.  Patient's breathing has improved a bunch since admission.    Marlin Canary D.O.

## 2012-04-14 LAB — CULTURE, BLOOD (ROUTINE X 2)

## 2012-05-08 ENCOUNTER — Emergency Department (HOSPITAL_COMMUNITY): Payer: Medicare Other

## 2012-05-08 ENCOUNTER — Encounter (HOSPITAL_COMMUNITY): Payer: Self-pay | Admitting: *Deleted

## 2012-05-08 ENCOUNTER — Emergency Department (HOSPITAL_COMMUNITY)
Admission: EM | Admit: 2012-05-08 | Discharge: 2012-05-08 | Disposition: A | Discharge status: Home / Self Care | Payer: Medicare Other | Attending: Emergency Medicine | Admitting: Emergency Medicine

## 2012-05-08 DIAGNOSIS — J449 Chronic obstructive pulmonary disease, unspecified: Secondary | ICD-10-CM | POA: Insufficient documentation

## 2012-05-08 DIAGNOSIS — S0181XA Laceration without foreign body of other part of head, initial encounter: Secondary | ICD-10-CM

## 2012-05-08 DIAGNOSIS — Z7901 Long term (current) use of anticoagulants: Secondary | ICD-10-CM | POA: Insufficient documentation

## 2012-05-08 DIAGNOSIS — G319 Degenerative disease of nervous system, unspecified: Secondary | ICD-10-CM | POA: Insufficient documentation

## 2012-05-08 DIAGNOSIS — W108XXA Fall (on) (from) other stairs and steps, initial encounter: Secondary | ICD-10-CM | POA: Insufficient documentation

## 2012-05-08 DIAGNOSIS — S0180XA Unspecified open wound of other part of head, initial encounter: Secondary | ICD-10-CM | POA: Insufficient documentation

## 2012-05-08 DIAGNOSIS — I4891 Unspecified atrial fibrillation: Secondary | ICD-10-CM | POA: Insufficient documentation

## 2012-05-08 DIAGNOSIS — J4489 Other specified chronic obstructive pulmonary disease: Secondary | ICD-10-CM | POA: Insufficient documentation

## 2012-05-08 DIAGNOSIS — Z7982 Long term (current) use of aspirin: Secondary | ICD-10-CM | POA: Insufficient documentation

## 2012-05-08 DIAGNOSIS — Z87891 Personal history of nicotine dependence: Secondary | ICD-10-CM | POA: Insufficient documentation

## 2012-05-08 DIAGNOSIS — S0003XA Contusion of scalp, initial encounter: Secondary | ICD-10-CM | POA: Insufficient documentation

## 2012-05-08 DIAGNOSIS — G8929 Other chronic pain: Secondary | ICD-10-CM | POA: Insufficient documentation

## 2012-05-08 DIAGNOSIS — Y92009 Unspecified place in unspecified non-institutional (private) residence as the place of occurrence of the external cause: Secondary | ICD-10-CM | POA: Insufficient documentation

## 2012-05-08 DIAGNOSIS — I10 Essential (primary) hypertension: Secondary | ICD-10-CM | POA: Insufficient documentation

## 2012-05-08 LAB — PROTIME-INR: Prothrombin Time: 23.5 seconds — ABNORMAL HIGH (ref 11.6–15.2)

## 2012-05-08 MED ORDER — CEPHALEXIN 500 MG PO CAPS
500.0000 mg | ORAL_CAPSULE | Freq: Once | ORAL | Status: AC
  Administered 2012-05-08: 500 mg via ORAL
  Filled 2012-05-08: qty 1

## 2012-05-08 MED ORDER — OXYCODONE-ACETAMINOPHEN 10-325 MG PO TABS: ORAL_TABLET | ORAL | Status: DC

## 2012-05-08 MED ORDER — CEPHALEXIN 500 MG PO CAPS: 500.0000 mg | ORAL_CAPSULE | Freq: Four times a day (QID) | ORAL | Status: AC

## 2012-05-08 MED ORDER — OXYCODONE-ACETAMINOPHEN 5-325 MG PO TABS
1.0000 | ORAL_TABLET | Freq: Once | ORAL | Status: AC
  Administered 2012-05-08: 1 via ORAL
  Filled 2012-05-08: qty 1

## 2012-05-08 MED ORDER — TETANUS-DIPHTH-ACELL PERTUSSIS 5-2.5-18.5 LF-MCG/0.5 IM SUSP
0.5000 mL | Freq: Once | INTRAMUSCULAR | Status: AC
  Administered 2012-05-08: 0.5 mL via INTRAMUSCULAR
  Filled 2012-05-08: qty 0.5

## 2012-05-08 NOTE — ED Provider Notes (Signed)
History     CSN: [id number]  Arrival date & time 05/08/12  4098   First MD Initiated Contact with Patient 05/08/12 1001      Chief Complaint  Patient presents with  . Fall  . Head Laceration    (Consider location/radiation/quality/duration/timing/severity/associated sxs/prior treatment) HPI Comments: Pt lives alone.  Was walking down his porch stairs using a walker and fell striking head/face on concrete walkway.  No LOC.  He denies any other complaints or injuries.  Is on coumadin for A fib.  INR ~ 5 days ago was 3.6.  States one dose of coumadin withheld.  Has not been re-checked since then.  He specifically denies neck pain.  He had no CP or SOB or diaphoresis prior to fall.  Thinks last dT was 4 years ago.  PCP is dr. Pete Glatter.  Patient is a 78 y.o. male presenting with fall and scalp laceration. The history is provided by the patient. No language interpreter was used.  Fall The accident occurred less than 1 hour ago. The fall occurred while walking. Distance fallen: standing height. He landed on concrete. The point of impact was the head. The pain is present in the head. The pain is moderate. He was ambulatory at the scene. There was no entrapment after the fall. There was no drug use involved in the accident. There was no alcohol use involved in the accident. Pertinent negatives include no visual change, no fever, no numbness, no nausea, no vomiting, no hearing loss and no loss of consciousness. He has tried nothing for the symptoms.  Head Laceration Pertinent negatives include no chest pain, chills, fever, nausea, neck pain, numbness, visual change, vomiting or weakness.    Past Medical History  Diagnosis Date  . Atrial fibrillation with slow ventricular response     Symptoms of weakness  . Paroxysmal atrial fibrillation     Hx of, currently on amiodarone load  . Syncope     Probably related to sinus pause, finally conversion from atrial fibrillation to sinus rhythm  . COPD  (chronic obstructive pulmonary disease)   . Hypertension   . Abnormal CXR 07/06/2007; 06/15/2010    Eleveted R HD 07/06/2007 > no change 09/92/2011  . Chronic back pain     Past Surgical History  Procedure Date  . Joint replacement     Family History  Problem Relation Age of Onset  . Heart disease Brother     Atrial fibrillation  . Atopy Neg Hx     History  Substance Use Topics  . Smoking status: Former Smoker -- 3.0 packs/day for 15 years    Quit date: 10/15/1963  . Smokeless tobacco: Not on file  . Alcohol Use: 3.5 oz/week    7 drink(s) per week      Review of Systems  Constitutional: Negative for fever and chills.  HENT: Positive for facial swelling. Negative for nosebleeds, neck pain and ear discharge.   Respiratory: Negative for shortness of breath.   Cardiovascular: Negative for chest pain.  Gastrointestinal: Negative for nausea and vomiting.  Neurological: Negative for seizures, loss of consciousness, syncope, facial asymmetry, speech difficulty, weakness and numbness.  Psychiatric/Behavioral: Negative for confusion.  All other systems reviewed and are negative.    Allergies  Review of patient's allergies indicates no known allergies.  Home Medications   Current Outpatient Rx  Name Route Sig Dispense Refill  . AMIODARONE HCL 200 MG PO TABS Oral Take 200 mg by mouth daily.     . ASPIRIN  81 MG PO TBEC Oral Take 81 mg by mouth daily.      Marland Kitchen BISACODYL 5 MG PO TBEC Oral Take 1 tablet (5 mg total) by mouth daily as needed. 30 tablet   . CEPHALEXIN 500 MG PO CAPS Oral Take 1 capsule (500 mg total) by mouth 4 (four) times daily. 28 capsule 0  . DOXAZOSIN MESYLATE 4 MG PO TABS Oral Take 4 mg by mouth at bedtime.     . FENTANYL 25 MCG/HR TD PT72 Transdermal Place 1 patch onto the skin every 3 (three) days.      . GUAIFENESIN ER 600 MG PO TB12 Oral Take 1 tablet (600 mg total) by mouth 2 (two) times daily.    Marland Kitchen MIRTAZAPINE 7.5 MG PO TABS Oral Take 1 tablet by mouth At  bedtime.     . OXYCODONE HCL ER 10 MG PO TB12 Oral Take 1 tablet (10 mg total) by mouth every 12 (twelve) hours. 15 tablet 0  . SENNA 8.6 MG PO TABS Oral Take 1 tablet (8.6 mg total) by mouth 2 (two) times daily. 120 each   . WARFARIN SODIUM 1 MG PO TABS  For 3 days with INR check 04/13/12. Dose adjusted at that time      BP 129/74  Pulse 77  Temp 98.6 F (37 C) (Oral)  Resp 16  Ht 5\' 6"  (1.676 m)  Wt 140 lb (63.504 kg)  BMI 22.60 kg/m2  SpO2 99%  Physical Exam  Nursing note and vitals reviewed. Constitutional: He is oriented to person, place, and time. He appears well-developed and well-nourished. He is cooperative.  Non-toxic appearance. He does not have a sickly appearance. He does not appear ill. No distress.  HENT:  Head: Normocephalic. Head is with laceration. Head is without raccoon's eyes and without Battle's sign.    Right Ear: Hearing, tympanic membrane, external ear and ear canal normal.  Left Ear: Hearing, tympanic membrane, external ear and ear canal normal.  Nose: Nasal deformity present. No nose lacerations. No epistaxis.  Eyes: Conjunctivae, EOM and lids are normal. Pupils are equal, round, and reactive to light. Scleral icterus is present. Right eye exhibits normal extraocular motion and no nystagmus. Left eye exhibits normal extraocular motion and no nystagmus.  Neck: Normal range of motion. No spinous process tenderness and no muscular tenderness present. No rigidity.  Cardiovascular: Regular rhythm, normal heart sounds and intact distal pulses.  Tachycardia present.   Pulmonary/Chest: Effort normal and breath sounds normal. No accessory muscle usage. Not tachypneic. No respiratory distress. He has no decreased breath sounds. He has no wheezes. He has no rhonchi. He has no rales. He exhibits no tenderness.  Abdominal: Soft. He exhibits no distension. There is no tenderness.  Musculoskeletal: Normal range of motion.       Right hand: He exhibits normal range of motion,  no tenderness, no bony tenderness, normal capillary refill, no deformity, no laceration and no swelling. normal sensation noted. Normal strength noted.       Hands:      No spinal pain or PT  Neurological: He is alert and oriented to person, place, and time. No cranial nerve deficit or sensory deficit. Coordination normal. GCS eye subscore is 4. GCS verbal subscore is 5. GCS motor subscore is 6.  Skin: Skin is warm and dry.  Psychiatric: He has a normal mood and affect. His behavior is normal. Judgment normal.    ED Course  LACERATION REPAIR Date/Time: 05/08/2012 1:00 PM Performed by: Hyacinth Meeker,  Jaylenne Hamelin Authorized by: Evalina Field Consent: Verbal consent obtained. Written consent not obtained. Risks and benefits: risks, benefits and alternatives were discussed Consent given by: patient Patient understanding: patient states understanding of the procedure being performed Patient consent: the patient's understanding of the procedure matches consent given Site marked: the operative site was not marked Imaging studies: imaging studies available Required items: required blood products, implants, devices, and special equipment available Patient identity confirmed: verbally with patient Body area: head/neck Location details: forehead Laceration length: 7 cm Contamination: The wound is contaminated. Foreign body present: dirt and small rocks. Tendon involvement: none Nerve involvement: none Vascular damage: no Anesthesia: local infiltration Local anesthetic: lidocaine 1% with epinephrine Anesthetic total: 5 ml Patient sedated: no Preparation: Patient was prepped and draped in the usual sterile fashion. Irrigation solution: saline Irrigation method: syringe Amount of cleaning: extensive Debridement: moderate Degree of undermining: none Skin closure: 6-0 Prolene Number of sutures: 10 Technique: simple Approximation: close Approximation difficulty: complex Dressing: antibiotic  ointment Patient tolerance: Patient tolerated the procedure well with no immediate complications.   (including critical care time)  Labs Reviewed  PROTIME-INR - Abnormal; Notable for the following:    Prothrombin Time 23.5 (*)     INR 2.05 (*)     All other components within normal limits   Ct Head Wo Contrast  05/08/2012  *RADIOLOGY REPORT*  Clinical Data:  Status post fall with a blow to the head. Laceration.  CT HEAD WITHOUT CONTRAST CT MAXILLOFACIAL WITHOUT CONTRAST CT CERVICAL SPINE WITHOUT CONTRAST  Technique:  Multidetector CT imaging of the head, cervical spine, and maxillofacial structures were performed using the standard protocol without intravenous contrast. Multiplanar CT image reconstructions of the cervical spine and maxillofacial structures were also generated.  Comparison:  Head and cervical spine CT scan 08/06/2010.  CT HEAD  Findings: The brain is atrophic with chronic microvascular ischemic change.  No evidence of acute infarction, hemorrhage, mass lesion, mass effect, midline shift or abnormal extra-axial fluid collection.  No hydrocephalus or pneumocephalus.  Calvarium is intact.  There is some left frontal sinus disease.  Hematoma about the left supra orbital rim is noted.  IMPRESSION:  1.  Hematoma left supraorbital rim without underlying fracture or acute intracranial abnormality. 2.  Atrophy and chronic microvascular ischemic change.  CT MAXILLOFACIAL  Findings:  Hematoma over left supraorbital rim is identified.  The globes are intact and the lenses are located.  Orbital fat appears normal.  There is no facial bone fracture.  Mucosal thickening left frontal sinus is noted.  Imaged paranasal sinuses are otherwise clear.  Degenerative change is seen about the temporomandibular joints bilaterally.  IMPRESSION: Hematoma over the left supraorbital rim without underlying fracture.  CT CERVICAL SPINE  Findings:   Multilevel cervical degenerative change is identified. No fracture or  subluxation. Lung apices are clear.  IMPRESSION: No acute finding.  Multilevel degenerative disease.  Original Report Authenticated By: Bernadene Bell. Maricela Curet, M.D.   Ct Cervical Spine Wo Contrast  05/08/2012  *RADIOLOGY REPORT*  Clinical Data:  Status post fall with a blow to the head. Laceration.  CT HEAD WITHOUT CONTRAST CT MAXILLOFACIAL WITHOUT CONTRAST CT CERVICAL SPINE WITHOUT CONTRAST  Technique:  Multidetector CT imaging of the head, cervical spine, and maxillofacial structures were performed using the standard protocol without intravenous contrast. Multiplanar CT image reconstructions of the cervical spine and maxillofacial structures were also generated.  Comparison:  Head and cervical spine CT scan 08/06/2010.  CT HEAD  Findings: The brain is atrophic with  chronic microvascular ischemic change.  No evidence of acute infarction, hemorrhage, mass lesion, mass effect, midline shift or abnormal extra-axial fluid collection.  No hydrocephalus or pneumocephalus.  Calvarium is intact.  There is some left frontal sinus disease.  Hematoma about the left supra orbital rim is noted.  IMPRESSION:  1.  Hematoma left supraorbital rim without underlying fracture or acute intracranial abnormality. 2.  Atrophy and chronic microvascular ischemic change.  CT MAXILLOFACIAL  Findings:  Hematoma over left supraorbital rim is identified.  The globes are intact and the lenses are located.  Orbital fat appears normal.  There is no facial bone fracture.  Mucosal thickening left frontal sinus is noted.  Imaged paranasal sinuses are otherwise clear.  Degenerative change is seen about the temporomandibular joints bilaterally.  IMPRESSION: Hematoma over the left supraorbital rim without underlying fracture.  CT CERVICAL SPINE  Findings:   Multilevel cervical degenerative change is identified. No fracture or subluxation. Lung apices are clear.  IMPRESSION: No acute finding.  Multilevel degenerative disease.  Original Report  Authenticated By: Bernadene Bell. Maricela Curet, M.D.   Ct Maxillofacial Wo Cm  05/08/2012  *RADIOLOGY REPORT*  Clinical Data:  Status post fall with a blow to the head. Laceration.  CT HEAD WITHOUT CONTRAST CT MAXILLOFACIAL WITHOUT CONTRAST CT CERVICAL SPINE WITHOUT CONTRAST  Technique:  Multidetector CT imaging of the head, cervical spine, and maxillofacial structures were performed using the standard protocol without intravenous contrast. Multiplanar CT image reconstructions of the cervical spine and maxillofacial structures were also generated.  Comparison:  Head and cervical spine CT scan 08/06/2010.  CT HEAD  Findings: The brain is atrophic with chronic microvascular ischemic change.  No evidence of acute infarction, hemorrhage, mass lesion, mass effect, midline shift or abnormal extra-axial fluid collection.  No hydrocephalus or pneumocephalus.  Calvarium is intact.  There is some left frontal sinus disease.  Hematoma about the left supra orbital rim is noted.  IMPRESSION:  1.  Hematoma left supraorbital rim without underlying fracture or acute intracranial abnormality. 2.  Atrophy and chronic microvascular ischemic change.  CT MAXILLOFACIAL  Findings:  Hematoma over left supraorbital rim is identified.  The globes are intact and the lenses are located.  Orbital fat appears normal.  There is no facial bone fracture.  Mucosal thickening left frontal sinus is noted.  Imaged paranasal sinuses are otherwise clear.  Degenerative change is seen about the temporomandibular joints bilaterally.  IMPRESSION: Hematoma over the left supraorbital rim without underlying fracture.  CT CERVICAL SPINE  Findings:   Multilevel cervical degenerative change is identified. No fracture or subluxation. Lung apices are clear.  IMPRESSION: No acute finding.  Multilevel degenerative disease.  Original Report Authenticated By: Bernadene Bell. D'ALESSIO, M.D.     1. Facial laceration       MDM  rx-keflex 500 mg QID, 28 Ice Wash  BID Suture removal in 5-6 days.  Return to ED if cahange in LOC, behavior or coordination.        Evalina Field, [place] 05/08/12 1333

## 2012-05-08 NOTE — ED Notes (Signed)
Pt reports tripping and falling between 7-7:30 this am, striking head on either concrete or a walker. Reports lac to forehead/L eyebrow. Wrapped in gauze, bleeding controlled. Pt on coumadin. Pt falling asleep in triage, reports he has not slept well last 2 nights and is not any sleepier than usual since fall. Pt A&O.

## 2012-05-08 NOTE — Progress Notes (Signed)
Cm confirmed with family member that pcp is hal stoneking EPIC updated

## 2012-05-08 NOTE — ED Notes (Signed)
Pt refuses to change into gown

## 2012-05-08 NOTE — ED Notes (Addendum)
Pt's daughter sts she has an appt she has to be at and cannot take pt home at this time. Sts there is no one there to stay with pt while she is gone and she does not feel comfortable leaving pt alone after his fall today. States she will return at 1630 to pick up pt.

## 2012-05-12 NOTE — ED Provider Notes (Signed)
Medical screening examination/treatment/procedure(s) were conducted as a shared visit with non-physician practitioner(s) and myself.  I personally evaluated the patient during the encounter.  Mechanical fall. Imaging negative for serious acute traumatic injury. Nonfocal neurological examination. Lacerations which were repaired. Grossly contaminated. Will place on abx.     Raeford Razor, MD 05/12/12 1031

## 2012-08-08 ENCOUNTER — Encounter (HOSPITAL_COMMUNITY): Payer: Self-pay | Admitting: *Deleted

## 2012-08-08 ENCOUNTER — Inpatient Hospital Stay (HOSPITAL_COMMUNITY)
Admission: EM | Admit: 2012-08-08 | Discharge: 2012-08-14 | DRG: 208 | Disposition: A | Discharge status: Rehabilitation Facility | Payer: Medicare Other | Attending: Pulmonary Disease | Admitting: Pulmonary Disease

## 2012-08-08 ENCOUNTER — Inpatient Hospital Stay (HOSPITAL_COMMUNITY): Payer: Medicare Other

## 2012-08-08 ENCOUNTER — Emergency Department (HOSPITAL_COMMUNITY): Payer: Medicare Other

## 2012-08-08 DIAGNOSIS — R7401 Elevation of levels of liver transaminase levels: Secondary | ICD-10-CM | POA: Diagnosis present

## 2012-08-08 DIAGNOSIS — K225 Diverticulum of esophagus, acquired: Secondary | ICD-10-CM | POA: Diagnosis present

## 2012-08-08 DIAGNOSIS — I4891 Unspecified atrial fibrillation: Secondary | ICD-10-CM | POA: Diagnosis present

## 2012-08-08 DIAGNOSIS — I509 Heart failure, unspecified: Secondary | ICD-10-CM | POA: Diagnosis present

## 2012-08-08 DIAGNOSIS — J4489 Other specified chronic obstructive pulmonary disease: Secondary | ICD-10-CM | POA: Diagnosis present

## 2012-08-08 DIAGNOSIS — I10 Essential (primary) hypertension: Secondary | ICD-10-CM | POA: Diagnosis present

## 2012-08-08 DIAGNOSIS — R0602 Shortness of breath: Secondary | ICD-10-CM

## 2012-08-08 DIAGNOSIS — J189 Pneumonia, unspecified organism: Principal | ICD-10-CM | POA: Diagnosis present

## 2012-08-08 DIAGNOSIS — R079 Chest pain, unspecified: Secondary | ICD-10-CM

## 2012-08-08 DIAGNOSIS — I5032 Chronic diastolic (congestive) heart failure: Secondary | ICD-10-CM | POA: Diagnosis present

## 2012-08-08 DIAGNOSIS — R001 Bradycardia, unspecified: Secondary | ICD-10-CM | POA: Diagnosis not present

## 2012-08-08 DIAGNOSIS — D539 Nutritional anemia, unspecified: Secondary | ICD-10-CM | POA: Diagnosis present

## 2012-08-08 DIAGNOSIS — J9600 Acute respiratory failure, unspecified whether with hypoxia or hypercapnia: Secondary | ICD-10-CM | POA: Diagnosis not present

## 2012-08-08 DIAGNOSIS — E46 Unspecified protein-calorie malnutrition: Secondary | ICD-10-CM | POA: Diagnosis present

## 2012-08-08 DIAGNOSIS — R092 Respiratory arrest: Secondary | ICD-10-CM | POA: Diagnosis not present

## 2012-08-08 DIAGNOSIS — R7402 Elevation of levels of lactic acid dehydrogenase (LDH): Secondary | ICD-10-CM | POA: Diagnosis present

## 2012-08-08 DIAGNOSIS — Q396 Congenital diverticulum of esophagus: Secondary | ICD-10-CM | POA: Insufficient documentation

## 2012-08-08 DIAGNOSIS — R930 Abnormal findings on diagnostic imaging of skull and head, not elsewhere classified: Secondary | ICD-10-CM

## 2012-08-08 DIAGNOSIS — R5381 Other malaise: Secondary | ICD-10-CM | POA: Diagnosis present

## 2012-08-08 DIAGNOSIS — R1310 Dysphagia, unspecified: Secondary | ICD-10-CM | POA: Diagnosis present

## 2012-08-08 DIAGNOSIS — D649 Anemia, unspecified: Secondary | ICD-10-CM | POA: Diagnosis present

## 2012-08-08 DIAGNOSIS — J449 Chronic obstructive pulmonary disease, unspecified: Secondary | ICD-10-CM | POA: Diagnosis present

## 2012-08-08 DIAGNOSIS — I469 Cardiac arrest, cause unspecified: Secondary | ICD-10-CM

## 2012-08-08 HISTORY — DX: Congenital diverticulum of esophagus: Q39.6

## 2012-08-08 LAB — CBC WITH DIFFERENTIAL/PLATELET
Basophils Absolute: 0 10*3/uL (ref 0.0–0.1)
Basophils Absolute: 0 10*3/uL (ref 0.0–0.1)
Basophils Relative: 0 % (ref 0–1)
Eosinophils Absolute: 0.1 10*3/uL (ref 0.0–0.7)
HCT: 29.3 % — ABNORMAL LOW (ref 39.0–52.0)
Hemoglobin: 11.1 g/dL — ABNORMAL LOW (ref 13.0–17.0)
Lymphocytes Relative: 11 % — ABNORMAL LOW (ref 12–46)
Lymphs Abs: 1.2 10*3/uL (ref 0.7–4.0)
MCH: 35.6 pg — ABNORMAL HIGH (ref 26.0–34.0)
MCHC: 33.9 g/dL (ref 30.0–36.0)
Monocytes Absolute: 0.7 10*3/uL (ref 0.1–1.0)
Monocytes Absolute: 0.9 10*3/uL (ref 0.1–1.0)
Monocytes Relative: 8 % (ref 3–12)
Neutro Abs: 8.9 10*3/uL — ABNORMAL HIGH (ref 1.7–7.7)
Neutrophils Relative %: 80 % — ABNORMAL HIGH (ref 43–77)
Platelets: 305 10*3/uL (ref 150–400)
RBC: 2.76 MIL/uL — ABNORMAL LOW (ref 4.22–5.81)
RDW: 14.3 % (ref 11.5–15.5)
RDW: 14.6 % (ref 11.5–15.5)
WBC: 11 10*3/uL — ABNORMAL HIGH (ref 4.0–10.5)

## 2012-08-08 LAB — COMPREHENSIVE METABOLIC PANEL
ALT: 123 U/L — ABNORMAL HIGH (ref 0–53)
AST: 20 U/L (ref 0–37)
AST: 186 U/L — ABNORMAL HIGH (ref 0–37)
Albumin: 3.6 g/dL (ref 3.5–5.2)
BUN: 13 mg/dL (ref 6–23)
CO2: 29 mEq/L (ref 19–32)
Chloride: 102 mEq/L (ref 96–112)
Creatinine, Ser: 0.62 mg/dL (ref 0.50–1.35)
GFR calc non Af Amer: 83 mL/min — ABNORMAL LOW (ref >90)
Glucose, Bld: 178 mg/dL — ABNORMAL HIGH (ref 70–99)
Potassium: 3.9 mEq/L (ref 3.5–5.1)
Sodium: 140 mEq/L (ref 135–145)
Total Bilirubin: 1 mg/dL (ref 0.3–1.2)
Total Protein: 6.9 g/dL (ref 6.0–8.3)

## 2012-08-08 LAB — BLOOD GAS, ARTERIAL
Acid-Base Excess: 4.4 mmol/L — ABNORMAL HIGH (ref 0.0–2.0)
Drawn by: 257701
FIO2: 0.21 %
pCO2 arterial: 44 mmHg (ref 35.0–45.0)
pH, Arterial: 7.43 (ref 7.350–7.450)
pO2, Arterial: 74.7 mmHg — ABNORMAL LOW (ref 80.0–100.0)

## 2012-08-08 LAB — EXPECTORATED SPUTUM ASSESSMENT W GRAM STAIN, RFLX TO RESP C: Special Requests: NORMAL

## 2012-08-08 LAB — MAGNESIUM
Magnesium: 1.9 mg/dL (ref 1.5–2.5)
Magnesium: 1.8 mg/dL (ref 1.5–2.5)

## 2012-08-08 LAB — GLUCOSE, CAPILLARY

## 2012-08-08 LAB — INFLUENZA PANEL BY PCR (TYPE A & B)
Influenza A By PCR: NEGATIVE
Influenza B By PCR: NEGATIVE

## 2012-08-08 LAB — CBC
HCT: 31.3 % — ABNORMAL LOW (ref 39.0–52.0)
MCH: 35.9 pg — ABNORMAL HIGH (ref 26.0–34.0)
MCV: 105 fL — ABNORMAL HIGH (ref 78.0–100.0)
RBC: 2.98 MIL/uL — ABNORMAL LOW (ref 4.22–5.81)
WBC: 8.9 10*3/uL (ref 4.0–10.5)

## 2012-08-08 LAB — PROTIME-INR
INR: 1.08 (ref 0.00–1.49)
INR: 1.29 (ref 0.00–1.49)
Prothrombin Time: 15.8 seconds — ABNORMAL HIGH (ref 11.6–15.2)

## 2012-08-08 LAB — CREATININE, SERUM: GFR calc Af Amer: >90 mL/min (ref >90)

## 2012-08-08 MED ORDER — MIDAZOLAM HCL 5 MG/ML IJ SOLN
2.0000 mg | Freq: Once | INTRAMUSCULAR | Status: AC
  Administered 2012-08-08: 2 mg via INTRAVENOUS
  Filled 2012-08-08: qty 1

## 2012-08-08 MED ORDER — PROPOFOL 10 MG/ML IV EMUL
5.0000 ug/kg/min | INTRAVENOUS | Status: DC
  Administered 2012-08-08: 5 ug/kg/min via INTRAVENOUS
  Administered 2012-08-09: 12.598 ug/kg/min via INTRAVENOUS
  Administered 2012-08-09: 20 ug/kg/min via INTRAVENOUS
  Filled 2012-08-08 (×3): qty 100

## 2012-08-08 MED ORDER — ALBUTEROL (5 MG/ML) CONTINUOUS INHALATION SOLN: 10.0000 mg/h | INHALATION_SOLUTION | RESPIRATORY_TRACT | Status: DC

## 2012-08-08 MED ORDER — DEXTROSE 5 % IV SOLN
1.0000 g | INTRAVENOUS | Status: DC
  Administered 2012-08-09 – 2012-08-11 (×3): 1 g via INTRAVENOUS
  Filled 2012-08-08 (×3): qty 10

## 2012-08-08 MED ORDER — ONDANSETRON HCL 4 MG/2ML IJ SOLN: 4.0000 mg | Freq: Four times a day (QID) | INTRAMUSCULAR | Status: DC | PRN

## 2012-08-08 MED ORDER — ACETAMINOPHEN 325 MG PO TABS: 650.0000 mg | ORAL_TABLET | ORAL | Status: DC | PRN

## 2012-08-08 MED ORDER — ASPIRIN EC 81 MG PO TBEC
81.0000 mg | DELAYED_RELEASE_TABLET | Freq: Every day | ORAL | Status: DC
  Administered 2012-08-08: 81 mg via ORAL
  Filled 2012-08-08 (×2): qty 1

## 2012-08-08 MED ORDER — OXYCODONE HCL 5 MG PO TABS
5.0000 mg | ORAL_TABLET | ORAL | Status: DC | PRN
  Administered 2012-08-08 (×2): 5 mg via ORAL
  Filled 2012-08-08 (×2): qty 1

## 2012-08-08 MED ORDER — FENTANYL CITRATE 0.05 MG/ML IJ SOLN
25.0000 ug | INTRAMUSCULAR | Status: DC | PRN
  Administered 2012-08-08: 50 ug via INTRAVENOUS
  Filled 2012-08-08: qty 2

## 2012-08-08 MED ORDER — LORATADINE 10 MG PO TABS: 10.0000 mg | ORAL_TABLET | Freq: Every day | ORAL | Status: DC

## 2012-08-08 MED ORDER — AMIODARONE HCL 200 MG PO TABS
200.0000 mg | ORAL_TABLET | Freq: Every day | ORAL | Status: DC
  Administered 2012-08-08 – 2012-08-14 (×7): 200 mg via ORAL
  Filled 2012-08-08 (×7): qty 1

## 2012-08-08 MED ORDER — ENOXAPARIN SODIUM 40 MG/0.4ML ~~LOC~~ SOLN
40.0000 mg | SUBCUTANEOUS | Status: DC
  Filled 2012-08-08: qty 0.4

## 2012-08-08 MED ORDER — PSEUDOEPHEDRINE HCL ER 120 MG PO TB12
120.0000 mg | ORAL_TABLET | Freq: Two times a day (BID) | ORAL | Status: DC
  Filled 2012-08-08 (×3): qty 1

## 2012-08-08 MED ORDER — CHLORHEXIDINE GLUCONATE 0.12 % MT SOLN
15.0000 mL | Freq: Two times a day (BID) | OROMUCOSAL | Status: DC
  Administered 2012-08-09 – 2012-08-12 (×8): 15 mL via OROMUCOSAL
  Filled 2012-08-08 (×8): qty 15

## 2012-08-08 MED ORDER — DM-GUAIFENESIN ER 30-600 MG PO TB12
2.0000 | ORAL_TABLET | Freq: Two times a day (BID) | ORAL | Status: DC
  Filled 2012-08-08: qty 2

## 2012-08-08 MED ORDER — MIRTAZAPINE 7.5 MG PO TABS
7.5000 mg | ORAL_TABLET | Freq: Every day | ORAL | Status: DC
  Filled 2012-08-08: qty 1

## 2012-08-08 MED ORDER — PROPOFOL 10 MG/ML IV EMUL
INTRAVENOUS | Status: AC
  Filled 2012-08-08: qty 100

## 2012-08-08 MED ORDER — LEVALBUTEROL HCL 0.63 MG/3ML IN NEBU
0.6300 mg | INHALATION_SOLUTION | RESPIRATORY_TRACT | Status: DC | PRN
  Filled 2012-08-08: qty 3

## 2012-08-08 MED ORDER — LORAZEPAM 0.5 MG PO TABS
0.5000 mg | ORAL_TABLET | Freq: Once | ORAL | Status: AC
  Administered 2012-08-08: 0.5 mg via ORAL
  Filled 2012-08-08: qty 1

## 2012-08-08 MED ORDER — ALBUTEROL (5 MG/ML) CONTINUOUS INHALATION SOLN
10.0000 mg/h | INHALATION_SOLUTION | RESPIRATORY_TRACT | Status: DC
  Administered 2012-08-08: 10 mg/h via RESPIRATORY_TRACT

## 2012-08-08 MED ORDER — OXYCODONE-ACETAMINOPHEN 5-325 MG PO TABS
1.0000 | ORAL_TABLET | Freq: Once | ORAL | Status: AC
  Administered 2012-08-08: 1 via ORAL
  Filled 2012-08-08: qty 1

## 2012-08-08 MED ORDER — ALBUTEROL SULFATE (5 MG/ML) 0.5% IN NEBU
5.0000 mg | INHALATION_SOLUTION | Freq: Once | RESPIRATORY_TRACT | Status: AC
  Administered 2012-08-08: 2.5 mg via RESPIRATORY_TRACT
  Filled 2012-08-08: qty 1

## 2012-08-08 MED ORDER — SODIUM CHLORIDE 0.9 % IJ SOLN
3.0000 mL | Freq: Two times a day (BID) | INTRAMUSCULAR | Status: DC
  Administered 2012-08-08 – 2012-08-14 (×8): 3 mL via INTRAVENOUS

## 2012-08-08 MED ORDER — DEXTROSE 5 % IV SOLN
1.0000 g | Freq: Once | INTRAVENOUS | Status: AC
  Administered 2012-08-08: 1 g via INTRAVENOUS
  Filled 2012-08-08: qty 10

## 2012-08-08 MED ORDER — IPRATROPIUM BROMIDE 0.02 % IN SOLN
RESPIRATORY_TRACT | Status: AC
  Administered 2012-08-08: 0.5 mg
  Filled 2012-08-08: qty 2.5

## 2012-08-08 MED ORDER — FENTANYL CITRATE 0.05 MG/ML IJ SOLN
INTRAMUSCULAR | Status: AC
  Administered 2012-08-08: 50 ug
  Filled 2012-08-08: qty 2

## 2012-08-08 MED ORDER — ALBUTEROL SULFATE (5 MG/ML) 0.5% IN NEBU
INHALATION_SOLUTION | RESPIRATORY_TRACT | Status: AC
  Administered 2012-08-08: 2.5 mg via RESPIRATORY_TRACT
  Filled 2012-08-08: qty 1

## 2012-08-08 MED ORDER — SODIUM CHLORIDE 0.9 % IV SOLN: 250.0000 mL | INTRAVENOUS | Status: DC | PRN

## 2012-08-08 MED ORDER — PANTOPRAZOLE SODIUM 40 MG IV SOLR
40.0000 mg | Freq: Every day | INTRAVENOUS | Status: DC
  Filled 2012-08-08: qty 40

## 2012-08-08 MED ORDER — IPRATROPIUM BROMIDE 0.02 % IN SOLN
0.5000 mg | Freq: Four times a day (QID) | RESPIRATORY_TRACT | Status: DC
  Administered 2012-08-08 (×2): 0.5 mg via RESPIRATORY_TRACT
  Filled 2012-08-08 (×2): qty 2.5

## 2012-08-08 MED ORDER — IPRATROPIUM BROMIDE 0.02 % IN SOLN: 0.5000 mg | RESPIRATORY_TRACT | Status: DC | PRN

## 2012-08-08 MED ORDER — LEVALBUTEROL HCL 0.63 MG/3ML IN NEBU
0.6300 mg | INHALATION_SOLUTION | Freq: Four times a day (QID) | RESPIRATORY_TRACT | Status: DC
  Administered 2012-08-08 (×2): 0.63 mg via RESPIRATORY_TRACT
  Filled 2012-08-08 (×4): qty 3

## 2012-08-08 MED ORDER — DEXTROSE 5 % IV SOLN
500.0000 mg | INTRAVENOUS | Status: DC
  Administered 2012-08-09 – 2012-08-11 (×3): 500 mg via INTRAVENOUS
  Filled 2012-08-08 (×4): qty 500

## 2012-08-08 MED ORDER — ALBUTEROL SULFATE (5 MG/ML) 0.5% IN NEBU
2.5000 mg | INHALATION_SOLUTION | RESPIRATORY_TRACT | Status: DC | PRN
  Administered 2012-08-08 (×2): 2.5 mg via RESPIRATORY_TRACT
  Filled 2012-08-08: qty 0.5
  Filled 2012-08-08: qty 2

## 2012-08-08 MED ORDER — MENTHOL 3 MG MT LOZG
1.0000 | LOZENGE | OROMUCOSAL | Status: DC | PRN
  Filled 2012-08-08: qty 9

## 2012-08-08 MED ORDER — BIOTENE DRY MOUTH MT LIQD
15.0000 mL | Freq: Four times a day (QID) | OROMUCOSAL | Status: DC
  Administered 2012-08-09 – 2012-08-13 (×14): 15 mL via OROMUCOSAL

## 2012-08-08 MED ORDER — MIDAZOLAM HCL 2 MG/2ML IJ SOLN: 1.0000 mg | INTRAMUSCULAR | Status: DC | PRN

## 2012-08-08 MED ORDER — AZITHROMYCIN 250 MG PO TABS
500.0000 mg | ORAL_TABLET | Freq: Once | ORAL | Status: AC
  Administered 2012-08-08: 500 mg via ORAL
  Filled 2012-08-08: qty 2

## 2012-08-08 MED ORDER — MIDAZOLAM HCL 5 MG/ML IJ SOLN
INTRAMUSCULAR | Status: AC
  Administered 2012-08-08: 2 mg
  Filled 2012-08-08: qty 1

## 2012-08-08 MED ORDER — SODIUM CHLORIDE 0.9 % IJ SOLN: 3.0000 mL | INTRAMUSCULAR | Status: DC | PRN

## 2012-08-08 MED ORDER — DOXAZOSIN MESYLATE 4 MG PO TABS
4.0000 mg | ORAL_TABLET | Freq: Every day | ORAL | Status: DC
  Filled 2012-08-08: qty 1

## 2012-08-08 MED ORDER — SODIUM CHLORIDE 0.9 % IV BOLUS (SEPSIS)
500.0000 mL | Freq: Once | INTRAVENOUS | Status: AC
  Administered 2012-08-08: 500 mL via INTRAVENOUS

## 2012-08-08 NOTE — Progress Notes (Signed)
[facility] progress note. Chief complaint. CODE BLUE. History of present illness. This 78 year old male recently admitted with community-acquired pneumonia. He also hasn't history of loss COPD, congestive heart failure, paroxysmal atrial fib, etc. Patient to was being helped to the bathroom and was noted to choke on his secretions and then become nonresponsive. He was used to the floor and move back to bed. He was noted without spontaneous respirations and telemetry suggested a systole. CODE BLUE was initiated and pulse was reestablished. The patient to was successfully intubated and moved to ICU. A 12-lead EKG is atrial fib with rapid ventricular response, PVC or aberrant conduction complexes. Right bundle branch block. T wave abnormality consider inferior ischemia or digitalis effect. Vital signs. Temperature 98.1, pulse 91, respirations 18, blood pressure 118/62, O2 sats 100% on mechanical ventilation. General appearance. Frail appearing elderly male who is sedated and in no obvious distress. Cardiac. Rate and rhythm regular. Occasional irregular beat. Mild jugular venous distention. Mild peripheral edema. Lungs. Scattered rhonchi in both lung fields. Air movement is good in both lungs on mechanical ventilation. Stable O2 sats. Abdomen. Soft with positive bowel sounds. No indication of pain. Impression/plan Problem #1. CODE BLUE with asystole. Successfully regained pulse and blood pressure per CPR. Patient's intubated successfully and then moved to ICU setting. Portable chest x-ray stat to verify endotracheal tube placement. Will obtain stat ABG, CBC with differential, cemented, troponin now and then every 6 hours for a total of 3 sets, CK and CK-MB x1, and coags. The patient will likely transitioned to [facility] service now that he is on mechanical ventilation.  Lenny Pastel NP  Addendum Patient seen by Lenny Pastel NP. Patient not seen by me but will sign. Dr Joneen Roach.

## 2012-08-08 NOTE — ED Notes (Signed)
Patient transported to X-ray 

## 2012-08-08 NOTE — Progress Notes (Signed)
PT remains unable to perform FEV1 / PEF.

## 2012-08-08 NOTE — ED Notes (Signed)
Several weeks of increased COPD symptoms, last night increase with Shob, productive cough, Stridor type sound on arrival. Enroute 5mg  Albuterol and Atrovent

## 2012-08-08 NOTE — H&P (Signed)
[facility] History and Physical  [name] WUJ:[id number] DOB: 07/08/1922 DOA: 08/08/2012  Referring physician: Dr Rennis Chris PCP: Ginette Otto, MD  Specialists: None  Chief Complaint: cough/wheezing/SOB  HPI: [name] is a 78 y.o. male  with past medical history of atrial fibrillation on amiodarone no longer on anticoagulations secondary to fall risk the patient's daughter, history of COPD, hypertension, chronic back pain, recently hospitalized in June of 2013 for community acquired pneumonia and CHF exacerbation presenting to the ED with a three-day history of worsening shortness of breath, productive cough of clear sputum, sore throat, and wheezing. Patient seems reluctant to be hospitalized and had to be convinced by the ED physician and his daughter to be admitted and a such might be down playing his symptoms and history. Patient and daughter state that patient has had worsening shortness of breath x3 days with worsening wheezing, sore throat and a productive cough of clear sputum. Patient denies any fevers, no chills, no chest pain, no nausea, no vomiting, no abdominal pain, no dysuria, no hematochezia, no melena, no hematemesis, no dysuria, no change in his chronic lower extremity edema per patient. Patient states that he still Dr. polite 1 day prior to admission in the outpatient setting where chest x-ray was done which was negative and patient was placed on Mucinex and discharged home. With no improvement in his symptoms he presented to the ED. The ED physician patient is using some accessory muscles of respiration and was placed on a continuous nebulizer treatments with improvement in his symptoms. CBC which was done did show a left shift and a hemoglobin of 11.1 otherwise was within normal limits. Comprehensive metabolic profile which was done and a total bilirubin of 1.4 otherwise was within normal limits. Chest x-ray which was done was worrisome for bibasilar infiltrates  versus atelectasis. Patient was also noted to have a temperature 100.5 presentation. We were called to admit the patient for further evaluation and recommendations. Patient is adamant that he be discharged tomorrow after breakfast.   Review of Systems: The patient denies anorexia, fever, weight loss,, vision loss, decreased hearing, hoarseness, chest pain, syncope, dyspnea on exertion, peripheral edema, balance deficits, hemoptysis, abdominal pain, melena, hematochezia, severe indigestion/heartburn, hematuria, incontinence, genital sores, muscle weakness, suspicious skin lesions, transient blindness, difficulty walking, depression, unusual weight change, abnormal bleeding, enlarged lymph nodes, angioedema, and breast masses.   Past Medical History  Diagnosis Date  . Atrial fibrillation with slow ventricular response     Symptoms of weakness  . Paroxysmal atrial fibrillation     Hx of, currently on amiodarone load  . Syncope     Probably related to sinus pause, finally conversion from atrial fibrillation to sinus rhythm  . COPD (chronic obstructive pulmonary disease)   . Hypertension   . Abnormal CXR 07/06/2007; 06/15/2010    Eleveted R HD 07/06/2007 > no change 09/92/2011  . Chronic back pain   . 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 Joint replacement    Social History:  reports that he quit smoking about 48 years ago. He does not have any smokeless tobacco history on file. He reports that he drinks about 3.5 ounces of alcohol per week. He reports that he does not use illicit drugs.   No Known Allergies  Family History  Problem Relation Age of Onset  . Heart disease Brother     Atrial fibrillation  . Atopy Neg Hx  Prior to Admission medications   Medication Sig Start Date End Date Taking? Authorizing Provider  amiodarone (PACERONE) 200 MG tablet Take 200 mg by mouth daily.     Historical Provider, MD    aspirin 81 MG EC tablet Take 81 mg by mouth daily.      Historical Provider, MD  doxazosin (CARDURA) 4 MG tablet Take 4 mg by mouth at bedtime.     Historical Provider, MD  guaiFENesin (MUCINEX) 600 MG 12 hr tablet Take 1 tablet (600 mg total) by mouth 2 (two) times daily. 04/10/12 04/10/13  Gwenyth Bender, NP  mirtazapine (REMERON) 7.5 MG tablet Take 1 tablet by mouth At bedtime.  01/15/11   Historical Provider, MD   Physical Exam: Filed Vitals:   08/08/12 6644 08/08/12 0810 08/08/12 0829 08/08/12 0900  BP:      Pulse:      Temp: 100.5 F (38.1 C)     TempSrc: Rectal     Resp:      SpO2:  99% 97% 97%     General:  Sitting in chair using some accessory muscles for respiration however speaking in full sentences.  Eyes: PERRLA. Extraocular movements intact.  ENT: Oropharynx is clear, no lesions, no exudates.  Neck: Supple. No lymphadenopathy. No JVD.  Cardiovascular: Tachycardic regular rhythm no murmurs rubs or gallops  Respiratory: Coarse breath sounds in the bases bilaterally. Minimal wheezing noted.  Abdomen: Soft, nontender, nondistended, positive bowel sounds  Skin: Chronic venous stasis changes noted on the lower extremities bilaterally  Musculoskeletal: 4 out of 5 bilateral upper extremity strength. 4/5 bilateral lower extremity strength.  Psychiatric: Normal mood. Normal affect. Good judgment. Good insight.  Neurologic: Alert and oriented x3. Cranial nerves II through XII are grossly intact. No focal deficits.  Labs on Admission:  Basic Metabolic Panel:  Lab 08/08/12 0347  NA 139  K 3.9  CL 101  CO2 31  GLUCOSE 106*  BUN 13  CREATININE 0.62  CALCIUM 9.0  MG --  PHOS --   Liver Function Tests:  Lab 08/08/12 0740  AST 20  ALT 13  ALKPHOS 122*  BILITOT 1.4*  PROT 6.9  ALBUMIN 3.6   No results found for this basename: LIPASE:5,AMYLASE:5 in the last 168 hours No results found for this basename: AMMONIA:5 in the last 168 hours CBC:  Lab 08/08/12 0740   WBC 9.4  NEUTROABS 7.5  HGB 11.1*  HCT 32.7*  MCV 104.8*  PLT 319   Cardiac Enzymes: No results found for this basename: CKTOTAL:5,CKMB:5,CKMBINDEX:5,TROPONINI:5 in the last 168 hours  BNP (last 3 results)  Basename 04/08/12 0449  PROBNP 742.0*   CBG: No results found for this basename: GLUCAP:5 in the last 168 hours  Radiological Exams on Admission: Dg Chest 2 View  08/08/2012  *RADIOLOGY REPORT*  Clinical Data: Wheezing, shortness of breath  CHEST - 2 VIEW  Comparison:   the previous day's study  Findings: There has been development   of some patchy coarse interstitial and airspace opacities in the lower lung zones, left greater than right.  No effusion.  Heart size upper limits normal for technique.  Regional bones unremarkable.  Low lung volumes.  IMPRESSION:  1.  Patchy bibasilar infiltrates or atelectasis, left greater than right   Original Report Authenticated By: Osa Craver, M.D.     EKG: None  Assessment/Plan Principal Problem:  *Community acquired pneumonia Active Problems:  HYPERTENSION, BENIGN  Atrial fibrillation  DIASTOLIC HEART FAILURE, CHRONIC  DYSPNEA  COPD (  chronic obstructive pulmonary disease)  Anemia  #1 community-acquired pneumonia Patient was recently admitted in June of 2013 and treated for community-acquired pneumonia. Patient is presenting with a three-day history of a productive cough, wheezing, worsening shortness of breath, sore throat. Will admit the patient to telemetry. Will check a sputum Gram stain and culture. Will check a urine Legionella antigen urine pneumococcus antigen. We'll place on nebulizer treatments, IV Rocephin, IV azithromycin, Mucinex, oxygen. Will follow.  #2 paroxysmal atrial fibrillation Currently in normal sinus rhythm. Continue amiodarone for rate control. Aspirin. Per daughter patient was deemed no longer a Coumadin candidate secondary to fall risk and a such as Coumadin has recently been discontinued.  #3  anemia Likely secondary to anemia of chronic disease. H&H is stable. Follow.  #4 COPD Stable.  #5 hypertension Stable. Follow.  #6 history of diastolic CHF Stable. Compensated. Follow for now.  #7 prophylaxis Lovenox for DVT prophylaxis.  Code Status: Full Family Communication: updated patient and daughter Disposition Plan: Admit to telemetry  Time spent: 70 mins  [facility] Pager [phone]  If 7PM-7AM, please contact night-coverage [email or web address] Password [email or web address] 08/08/2012, 10:28 AM

## 2012-08-08 NOTE — ED Notes (Signed)
MD at bedside. 

## 2012-08-08 NOTE — Progress Notes (Signed)
Initial review for inpatient status is complete. 

## 2012-08-08 NOTE — ED Notes (Signed)
UJW:[id number]<YN> Expected date:08/08/12<BR> Expected time: 7:31 AM<BR> Means of arrival:Ambulance<BR> Comments:<BR> Wheezing, Shob, VSS

## 2012-08-08 NOTE — ED Provider Notes (Signed)
History     CSN: [id number]  Arrival date & time 08/08/12  7829   First MD Initiated Contact with Patient 08/08/12 [phone]      Chief Complaint  Patient presents with  . Wheezing  . COPD  . Shortness of Breath    (Consider location/radiation/quality/duration/timing/severity/associated sxs/prior treatment) HPI   Presents with wheezing and cough productive of white sputum and shortness of breath onset one month ago becoming worse today. Seen at his primary care's office yesterday for same complaint. Presents with his worsening this morning denies chest pain denies fever denies other complaint. Via EMS treated with albuterol nebulizer while en route. Nothing makes symptoms better or worse no other associated symptoms Past Medical History  Diagnosis Date  . Atrial fibrillation with slow ventricular response     Symptoms of weakness  . Paroxysmal atrial fibrillation     Hx of, currently on amiodarone load  . Syncope     Probably related to sinus pause, finally conversion from atrial fibrillation to sinus rhythm  . COPD (chronic obstructive pulmonary disease)   . Hypertension   . Abnormal CXR 07/06/2007; 06/15/2010    Eleveted R HD 07/06/2007 > no change 09/92/2011  . Chronic back pain     Past Surgical History  Procedure Date  . Joint replacement     Family History  Problem Relation Age of Onset  . Heart disease Brother     Atrial fibrillation  . Atopy Neg Hx     History  Substance Use Topics  . Smoking status: Former Smoker -- 3.0 packs/day for 15 years    Quit date: 10/15/1963  . Smokeless tobacco: Not on file  . Alcohol Use: 3.5 oz/week    7 drink(s) per week      Review of Systems  Constitutional: Negative.   HENT: Negative.   Respiratory: Positive for cough, shortness of breath and wheezing.   Cardiovascular: Negative.   Gastrointestinal: Negative.   Musculoskeletal: Negative.   Skin: Negative.   Neurological: Negative.   Hematological: Negative.     Psychiatric/Behavioral: Negative.   All other systems reviewed and are negative.    Allergies  Review of patient's allergies indicates no known allergies.  Home Medications   Current Outpatient Rx  Name Route Sig Dispense Refill  . AMIODARONE HCL 200 MG PO TABS Oral Take 200 mg by mouth daily.     . ASPIRIN 81 MG PO TBEC Oral Take 81 mg by mouth daily.      Marland Kitchen DOXAZOSIN MESYLATE 4 MG PO TABS Oral Take 4 mg by mouth at bedtime.     . FENTANYL 25 MCG/HR TD PT72 Transdermal Place 1 patch onto the skin every 3 (three) days.      . GUAIFENESIN ER 600 MG PO TB12 Oral Take 1 tablet (600 mg total) by mouth 2 (two) times daily.    Marland Kitchen MIRTAZAPINE 7.5 MG PO TABS Oral Take 1 tablet by mouth At bedtime.     . OXYCODONE HCL ER 10 MG PO TB12 Oral Take 1 tablet (10 mg total) by mouth every 12 (twelve) hours. 15 tablet 0  . OXYCODONE-ACETAMINOPHEN 10-325 MG PO TABS  One tab po QID prn pain 6 tablet 0  . SENNA 8.6 MG PO TABS Oral Take 1 tablet (8.6 mg total) by mouth 2 (two) times daily. 120 each   . WARFARIN SODIUM 1 MG PO TABS  For 3 days with INR check 04/13/12. Dose adjusted at that time  BP 150/76  Pulse 95  Temp 98.9 F (37.2 C) (Oral)  Resp 20  SpO2 100%  Physical Exam  Nursing note and vitals reviewed. Constitutional: He appears well-developed and well-nourished.       Chronically ill-appearing  HENT:  Head: Normocephalic and atraumatic.  Eyes: Conjunctivae normal are normal. Pupils are equal, round, and reactive to light.  Neck: Neck supple. No tracheal deviation present. No thyromegaly present.  Cardiovascular: Normal rate and regular rhythm.   No murmur heard. Pulmonary/Chest:       Positive retractions positive inspiratory next wheezes, prolonged expiratory phase speaks in sentences  Abdominal: Soft. Bowel sounds are normal. He exhibits no distension. There is no tenderness.  Musculoskeletal: Normal range of motion. He exhibits no edema and no tenderness.  Neurological: He is  alert. Coordination normal.  Skin: Skin is warm and dry. No rash noted.  Psychiatric: He has a normal mood and affect.    ED Course  Procedures (including critical care time)   Labs Reviewed  CBC WITH DIFFERENTIAL  COMPREHENSIVE METABOLIC PANEL   No results found.   No diagnosis found.   Date: 08/08/2012  Rate: 95  Rhythm: normal sinus rhythm  QRS Axis: right  Intervals: normal  ST/T Wave abnormalities: nonspecific T wave changes  Conduction Disutrbances:right bundle branch block  Narrative Interpretation:   Old EKG Reviewed: Last tracing showed atrial fibrillation with controlled rate otherwise no significant change interpreted by me Results for orders placed during the hospital encounter of 08/08/12  CBC WITH DIFFERENTIAL      Component Value Range   WBC 9.4  4.0 - 10.5 K/uL   RBC 3.12 (*) 4.22 - 5.81 MIL/uL   Hemoglobin 11.1 (*) 13.0 - 17.0 g/dL   HCT 40.9 (*) 81.1 - 91.4 %   MCV 104.8 (*) 78.0 - 100.0 fL   MCH 35.6 (*) 26.0 - 34.0 pg   MCHC 33.9  30.0 - 36.0 g/dL   RDW 78.2  95.6 - 21.3 %   Platelets 319  150 - 400 K/uL   Neutrophils Relative 80 (*) 43 - 77 %   Neutro Abs 7.5  1.7 - 7.7 K/uL   Lymphocytes Relative 12  12 - 46 %   Lymphs Abs 1.1  0.7 - 4.0 K/uL   Monocytes Relative 8  3 - 12 %   Monocytes Absolute 0.7  0.1 - 1.0 K/uL   Eosinophils Relative 1  0 - 5 %   Eosinophils Absolute 0.1  0.0 - 0.7 K/uL   Basophils Relative 0  0 - 1 %   Basophils Absolute 0.0  0.0 - 0.1 K/uL  COMPREHENSIVE METABOLIC PANEL      Component Value Range   Sodium 139  135 - 145 mEq/L   Potassium 3.9  3.5 - 5.1 mEq/L   Chloride 101  96 - 112 mEq/L   CO2 31  19 - 32 mEq/L   Glucose, Bld 106 (*) 70 - 99 mg/dL   BUN 13  6 - 23 mg/dL   Creatinine, Ser 0.86  0.50 - 1.35 mg/dL   Calcium 9.0  8.4 - 57.8 mg/dL   Total Protein 6.9  6.0 - 8.3 g/dL   Albumin 3.6  3.5 - 5.2 g/dL   AST 20  0 - 37 U/L   ALT 13  0 - 53 U/L   Alkaline Phosphatase 122 (*) 39 - 117 U/L   Total  Bilirubin 1.4 (*) 0.3 - 1.2 mg/dL   GFR calc non  Af Amer 86 (*) >90 mL/min   GFR calc Af Amer >90  >90 mL/min  PROTIME-INR      Component Value Range   Prothrombin Time 13.9  11.6 - 15.2 seconds   INR 1.08  0.00 - 1.49   Dg Chest 2 View  08/08/2012  *RADIOLOGY REPORT*  Clinical Data: Wheezing, shortness of breath  CHEST - 2 VIEW  Comparison:   the previous day's study  Findings: There has been development   of some patchy coarse interstitial and airspace opacities in the lower lung zones, left greater than right.  No effusion.  Heart size upper limits normal for technique.  Regional bones unremarkable.  Low lung volumes.  IMPRESSION:  1.  Patchy bibasilar infiltrates or atelectasis, left greater than right   Original Report Authenticated By: Osa Craver, M.D.     10 AM patient speaks in sentences after treatment with nebulization. MDM  Chest x-ray reviewed by me. Consistent with pneumonia. Spoke with Dr. Janee Morn plan admit to telemetry bed Diagnosis #1 community acquired pneumonia #2 exacerbation of COPD #3 anemia       Doug Sou, MD 08/08/12 1008

## 2012-08-08 NOTE — Consult Note (Signed)
Name: [name] MRN: [id number] DOB: Jan 06, 1922    LOS: 0  Referring Provider:  Rodolph Bong, MD Reason for Referral:  Cardiac Arrest  PULMONARY / CRITICAL CARE MEDICINE  HPI:  78 yo M with afib, HTN and recent admission for CAP in 03/2012 who was admitted earlier today with worsening shortness of breath, cough productive of clear sputum and wheezing.  He was noted to have a temperature to 100.84F.  Chest x-ray showed bibasilar opacities.  He was reluctant to be admitted but agreed to come into the hospital overnight.  He was started on ceftriaxone and azithromycin.    Earlier this evening he was noted to be agitated.  He received 0.5 mg ativan. He later used the bedside and was noted to have difficulty breathing and was chocking on his secretions.  He then became unresponsive and was found to be pulseless.  Chest compression started and he was found to be in asystole.  He received 2 rounds of epi and one amp of bicarb with ROSC.   Past Medical History  Diagnosis Date  . Atrial fibrillation with slow ventricular response     Symptoms of weakness  . Paroxysmal atrial fibrillation     Hx of, currently on amiodarone load  . Syncope     Probably related to sinus pause, finally conversion from atrial fibrillation to sinus rhythm  . COPD (chronic obstructive pulmonary disease)   . Hypertension   . Abnormal CXR 07/06/2007; 06/15/2010    Eleveted R HD 07/06/2007 > no change 09/92/2011  . Chronic back pain   . 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 Joint replacement    Prior to Admission medications   Medication Sig Start Date End Date Taking? Authorizing Provider  oxyCODONE-acetaminophen (PERCOCET) 10-325 MG per tablet Take 1 tablet by mouth every 6 (six) hours as needed. For pain.   Yes Historical Provider, MD  amiodarone (PACERONE) 200 MG tablet Take 200 mg by mouth daily.     Historical Provider, MD  aspirin 81  MG EC tablet Take 81 mg by mouth daily.      Historical Provider, MD  doxazosin (CARDURA) 4 MG tablet Take 4 mg by mouth at bedtime.     Historical Provider, MD  guaiFENesin (MUCINEX) 600 MG 12 hr tablet Take 1 tablet (600 mg total) by mouth 2 (two) times daily. 04/10/12 04/10/13  Gwenyth Bender, NP  mirtazapine (REMERON) 7.5 MG tablet Take 1 tablet by mouth At bedtime.  01/15/11   Historical Provider, MD   Allergies No Known Allergies  Family History Family History  Problem Relation Age of Onset  . Heart disease Brother     Atrial fibrillation  . Atopy Neg Hx    Social History  reports that he quit smoking about 48 years ago. He does not have any smokeless tobacco history on file. He reports that he drinks about 3.5 ounces of alcohol per week. He reports that he does not use illicit drugs.  Review Of Systems:  Unable to obtain as patient is intubated.  Brief patient description:  78 yo M admitted with PNA and later developed cardiac arrest.  Events Since Admission: 10/26 Admission, cardiac arrest and transfer to ICU  Current Status:  Vital Signs: Temp:  [98.1 F (36.7 C)-100.5 F (38.1 C)] 98.1 F (36.7 C) (10/26 1125) Pulse Rate:  [91-109] 91  (10/26 2022) Resp:  [20-26] 26  (  10/26 2022) BP: (117-160)/(74-97) 160/97 mmHg (10/26 1840) SpO2:  [88 %-100 %] 95 % (10/26 2034) FiO2 (%):  [50 %] 50 % (10/26 2303) Weight:  [63.504 kg (140 lb)] 63.504 kg (140 lb) (10/26 1125)  Physical Examination: General:  Sedated and intubated Neuro:  Agitated, moving all 4 extremities, not following commands HEENT: Sclera clear, ETT in place Neck:  supple Cardiovascular:  Irreg, irreg Lungs:  Decreased left BS, no wheeze Abdomen:  Soft, NT, ND Musculoskeletal:  joings wnl Skin:  No rash  Principal Problem:  *Community acquired pneumonia Active Problems:  HYPERTENSION, BENIGN  Atrial fibrillation  DIASTOLIC HEART FAILURE, CHRONIC  DYSPNEA  COPD (chronic obstructive pulmonary disease)   Anemia   ASSESSMENT AND PLAN  PULMONARY  Lab 08/08/12 2246 08/08/12 1014  PHART 7.369 7.430  PCO2ART 48.4* 44.0  PO2ART 194.0* 74.7*  HCO3 27.3* 28.6*  O2SAT 97.9 96.1   Ventilator Settings: Vent Mode:  [-] PRVC FiO2 (%):  [50 %] 50 % Set Rate:  [15 bmp] 15 bmp Vt Set:  [500 mL] 500 mL PEEP:  [5 cmH20] 5 cmH20 Plateau Pressure:  [20 cmH20] 20 cmH20 CXR:  Bilateral opacities, ETT at right main stem ETT:  10/26 >>>  A:  Acute on chronic shortness of breath, ?respiratory arrest. PNA P:   - Vent support overnight, wean as tolerated - Cover for CAP - Respiratory culture - Consider SBT in AM - Pull ETT back    CARDIOVASCULAR  Lab 08/08/12 2240  TROPONINI <0.30  LATICACIDVEN --  PROBNP --   ECG:  Afib, rate 123 Lines: 3 PIVs  A: Asystolic arrest, etiology unclear.  Consider hypoxia, aspiration, vagal. Chronic afib P:  - Labs ordered, chem and cardiac enzymes wnl - Repeat CEs in AM - Cardiac monitoring - Continue home amiodarone   RENAL  Lab 08/08/12 2240 08/08/12 1210 08/08/12 0740  NA 140 -- 139  K 3.8 -- 3.9  CL 102 -- 101  CO2 29 -- 31  BUN 15 -- 13  CREATININE 0.68 0.60 0.62  CALCIUM 8.3* -- 9.0  MG 1.8 1.9 --  PHOS -- -- --   Intake/Output      10/26 0701 - 10/27 0700   P.O. 240   Total Intake(mL/kg) 240 (3.8)   Net +240       Urine Occurrence 2 x   Stool Occurrence 1 x    Foley:  10/26>>  A:  Stable P:  Monitor UOP and electrolytes after cardiac arrest  GASTROINTESTINAL  Lab 08/08/12 2240 08/08/12 0740  AST 186* 20  ALT 123* 13  ALKPHOS 111 122*  BILITOT 1.0 1.4*  PROT 5.9* 6.9  ALBUMIN 3.0* 3.6    A:  Transaminitis after cardiac arrest P:   - Repeat LFTs in AM for trend  HEMATOLOGIC  Lab 08/08/12 2240 08/08/12 1210 08/08/12 0740  HGB 9.9* 10.7* 11.1*  HCT 29.3* 31.3* 32.7*  PLT 305 200 319  INR 1.29 -- 1.08  APTT 42* -- --   A:  Macrocytic anemia P:  Check B12 and Folate  INFECTIOUS  Lab 08/08/12 2240  08/08/12 1210 08/08/12 0740  WBC 11.0* 8.9 9.4  PROCALCITON -- -- --   Cultures: Blood 10/26>>> Sputum 10/26>>> Antibiotics: Ceftriaxone 10/26>>> Azithromycin 10/26>>>  A:  Possible CAP P:   - Continue ceftriaxone and azithromycin - F/u cultures -Check UA and trach aspirate   NEUROLOGIC  A:  Awake and agitate after cardiac arrest P:   - Sedation with propofol and prn  fentanyl.  BEST PRACTICE / DISPOSITION Level of Care:  Floor > ICU Primary Service:  PCCM Consultants:  none Code Status:  Full Diet:  NPO DVT Px:  lovenox GI Px:  protonix Skin Integrity:  No issues Social / Family:  Daughter updated at bedside  CC time 55 min  [name], M.D. Pulmonary and Critical Care Medicine [facility] Pager: [phone]  08/08/2012, 11:46 PM

## 2012-08-08 NOTE — Progress Notes (Signed)
We receive him from E.D. At this time in no distress.  He is mildly short of breath, and we assist his transferring to a recliner per his request.  He tells [place] he "breathes better if I sit up".  Very mildly pruritic rash noted at arms/upper back which he states he's had "for a couple of weeks or so".  We apply chair alarm for his safety, and explain its use.  Dr. Janee Morn is here and is aware that pt. Received IV antibiotics in E.D.  We apply tele. Box #[id number] and find him to be in A-fib. With rate of ~90.

## 2012-08-08 NOTE — Progress Notes (Signed)
PT unable to perform Pre / Peak Flow measurements at this time.

## 2012-08-09 ENCOUNTER — Inpatient Hospital Stay (HOSPITAL_COMMUNITY): Payer: Medicare Other

## 2012-08-09 DIAGNOSIS — J189 Pneumonia, unspecified organism: Principal | ICD-10-CM

## 2012-08-09 DIAGNOSIS — I469 Cardiac arrest, cause unspecified: Secondary | ICD-10-CM

## 2012-08-09 DIAGNOSIS — I498 Other specified cardiac arrhythmias: Secondary | ICD-10-CM

## 2012-08-09 DIAGNOSIS — I4891 Unspecified atrial fibrillation: Secondary | ICD-10-CM

## 2012-08-09 DIAGNOSIS — J9600 Acute respiratory failure, unspecified whether with hypoxia or hypercapnia: Secondary | ICD-10-CM

## 2012-08-09 DIAGNOSIS — R001 Bradycardia, unspecified: Secondary | ICD-10-CM | POA: Diagnosis not present

## 2012-08-09 LAB — CBC WITH DIFFERENTIAL/PLATELET
Eosinophils Absolute: 0 10*3/uL (ref 0.0–0.7)
Hemoglobin: 9.3 g/dL — ABNORMAL LOW (ref 13.0–17.0)
Lymphocytes Relative: 6 % — ABNORMAL LOW (ref 12–46)
Lymphs Abs: 0.6 10*3/uL — ABNORMAL LOW (ref 0.7–4.0)
MCH: 36 pg — ABNORMAL HIGH (ref 26.0–34.0)
Monocytes Relative: 10 % (ref 3–12)
Neutro Abs: 8.7 10*3/uL — ABNORMAL HIGH (ref 1.7–7.7)
Neutrophils Relative %: 84 % — ABNORMAL HIGH (ref 43–77)
Platelets: 292 10*3/uL (ref 150–400)
RBC: 2.58 MIL/uL — ABNORMAL LOW (ref 4.22–5.81)
WBC: 10.3 10*3/uL (ref 4.0–10.5)

## 2012-08-09 LAB — URINALYSIS, ROUTINE W REFLEX MICROSCOPIC
Bilirubin Urine: NEGATIVE
Glucose, UA: 100 mg/dL — AB
Specific Gravity, Urine: 1.014 (ref 1.005–1.030)
Urobilinogen, UA: 1 mg/dL (ref 0.0–1.0)
pH: 6.5 (ref 5.0–8.0)

## 2012-08-09 LAB — BASIC METABOLIC PANEL
BUN: 18 mg/dL (ref 6–23)
Chloride: 105 mEq/L (ref 96–112)
GFR calc non Af Amer: 75 mL/min — ABNORMAL LOW (ref >90)
Glucose, Bld: 101 mg/dL — ABNORMAL HIGH (ref 70–99)
Potassium: 3.7 mEq/L (ref 3.5–5.1)
Sodium: 142 mEq/L (ref 135–145)

## 2012-08-09 LAB — HEPATIC FUNCTION PANEL
ALT: 105 U/L — ABNORMAL HIGH (ref 0–53)
AST: 150 U/L — ABNORMAL HIGH (ref 0–37)
Albumin: 2.9 g/dL — ABNORMAL LOW (ref 3.5–5.2)
Bilirubin, Direct: 0.5 mg/dL — ABNORMAL HIGH (ref 0.0–0.3)
Total Protein: 5.6 g/dL — ABNORMAL LOW (ref 6.0–8.3)

## 2012-08-09 LAB — URINE MICROSCOPIC-ADD ON

## 2012-08-09 LAB — CK TOTAL AND CKMB (NOT AT ARMC)
CK, MB: 3.9 ng/mL (ref 0.3–4.0)
Relative Index: 2.8 — ABNORMAL HIGH (ref 0.0–2.5)

## 2012-08-09 MED ORDER — PANTOPRAZOLE SODIUM 40 MG PO PACK
40.0000 mg | PACK | Freq: Every day | ORAL | Status: DC
  Administered 2012-08-09 – 2012-08-10 (×2): 40 mg
  Filled 2012-08-09 (×3): qty 20

## 2012-08-09 MED ORDER — MIRTAZAPINE 7.5 MG PO TABS
7.5000 mg | ORAL_TABLET | Freq: Every day | ORAL | Status: DC
  Administered 2012-08-09 – 2012-08-13 (×5): 7.5 mg via ORAL
  Filled 2012-08-09 (×7): qty 1

## 2012-08-09 MED ORDER — ENOXAPARIN SODIUM 40 MG/0.4ML ~~LOC~~ SOLN
40.0000 mg | SUBCUTANEOUS | Status: DC
  Administered 2012-08-09 – 2012-08-14 (×6): 40 mg via SUBCUTANEOUS
  Filled 2012-08-09 (×7): qty 0.4

## 2012-08-09 MED ORDER — ASPIRIN 81 MG PO CHEW
81.0000 mg | CHEWABLE_TABLET | Freq: Every day | ORAL | Status: DC
  Administered 2012-08-10 – 2012-08-14 (×5): 81 mg
  Filled 2012-08-09 (×5): qty 1

## 2012-08-09 MED ORDER — DEXTROSE-NACL 5-0.9 % IV SOLN
INTRAVENOUS | Status: DC
  Administered 2012-08-09: 12:00:00 via INTRAVENOUS
  Administered 2012-08-12: 20 mL/h via INTRAVENOUS

## 2012-08-09 MED ORDER — SODIUM CHLORIDE 0.9 % IV BOLUS (SEPSIS)
500.0000 mL | Freq: Once | INTRAVENOUS | Status: AC
  Administered 2012-08-09: 500 mL via INTRAVENOUS

## 2012-08-09 MED FILL — Medication: Qty: 1 | Status: AC

## 2012-08-09 NOTE — Progress Notes (Signed)
Emergency Physician Code Blue Response Note: 78 yo male was admitted earlier today for CAP and COPD.  Pt was going to the restroom when he choked and became unresponsive.  Pt was brought to the bed.  NO pulse was noted, asystole on the monitor.  Code Blue was called.  I arrived and CPR had been initiated.  1mg  epi had been given.  Initial intubation attempt by Nurse anesthetist without success.  CPR was continued and I attempted intubation first with fast track briefly and then successfully using direct laryngoscopy.  1mg  epi was ordered as well as one amp sodium bicarbonate.  Return of spontaneous circulation obtained.  Wide complex rhyhm noted on the monitor with rate in the low 100s.  Strong femoral pulse palpated and patient had spontaneous respirations.  PCXR ordered.  Post resuscitation labs recommended.  Care turned over to the [facility] team and the patient is to be transferred to the ICU.  Procedure Note: Endotracheal Intubation Direct laryngoscopy using 4-0 Mac Blade Chords visualized ETT passed successfully, balloon inflated Tube confirmed with CO2 indicator Equal breath sounds.  CXR ordered

## 2012-08-09 NOTE — Progress Notes (Signed)
OT Note:  Pt intubated after coding last night.  Signing off.  Please reorder as appropriate.  Thanks. 08/09/2012 Martie Round, OTR/L Pager: [phone]

## 2012-08-09 NOTE — Progress Notes (Signed)
PT Cancellation Note  Patient Details Name: TRYONE STILLS MRN: [id number] DOB: 08/06/1922   Cancelled Treatment:    Reason Eval/Treat Not Completed: Patient not medically ready (coded August 28, 2023, on vent)  Will sign off on pt at this time. Please re-order as pt becomes medically stable and appropriate for therapy.   Thanks,    Page, Meribeth Mattes 08/09/2012, 11:36 AM

## 2012-08-09 NOTE — Progress Notes (Signed)
Name: [name] MRN: [id number] DOB: [birth year]    LOS: 1  Referring Provider:  Rodolph Bong, MD Reason for Referral:  Cardiac Arrest PCP- [facility]  PULMONARY / CRITICAL CARE MEDICINE  HPI:  78 yo M with afib on amio (no coumadin for fall risk) , HTN and recent admission for CAP in 03/2012 who was admitted 10/26 with worsening shortness of breath, cough productive of clear sputum and wheezing.  He was noted to have a temperature to 100.29F.  Chest x-ray showed bibasilar opacities.  He was reluctant to be admitted but agreed to come into the hospital overnight.  He was started on ceftriaxone and azithromycin.    10/26 evening he was noted to be agitated.  He received 0.5 mg ativan. He later used the bedside and was noted to have difficulty breathing and was chocking on his secretions.  He then became unresponsive and was found to be pulseless.  Chest compression started and he was found to be in asystole.  He received 2 rounds of epi and one amp of bicarb with ROSC- downtime 9 mins by code sheet.    Events Since Admission: 10/26 Admission, cardiac arrest and transfer to ICU  Current Status:  Vital Signs: Temp:  [98 F (36.7 C)-98.9 F (37.2 C)] 98.6 F (37 C) (10/27 0800) Pulse Rate:  [69-109] 70  (10/27 0813) Resp:  [11-39] 15  (10/27 0813) BP: (83-160)/(54-110) 103/67 mmHg (10/27 0813) SpO2:  [87 %-100 %] 100 % (10/27 0813) FiO2 (%):  [30 %-50 %] 30 % (10/27 0813) Weight:  [63.504 kg (140 lb)] 63.504 kg (140 lb) (10/26 1125)  Physical Examination: General:  Sedated on propofol and intubated Neuro:  Agitated, moving all 4 extremities, not following commands -prior to sedation HEENT: Sclera clear, ETT in place Neck:  supple Cardiovascular:  Irreg, irreg Lungs:  Decreased left BS, no wheeze Abdomen:  Soft, NT, ND Musculoskeletal:  joings wnl Skin:  No rash  Principal Problem:  *Community acquired pneumonia Active Problems:  HYPERTENSION, BENIGN  Atrial  fibrillation  DIASTOLIC HEART FAILURE, CHRONIC  DYSPNEA  COPD (chronic obstructive pulmonary disease)  Anemia   ASSESSMENT AND PLAN  PULMONARY  Lab 08/08/12 2347 08/08/12 2246 08/08/12 1014  PHART 7.431 7.369 7.430  PCO2ART 43.0 48.4* 44.0  PO2ART 168.0* 194.0* 74.7*  HCO3 28.1* 27.3* 28.6*  O2SAT 98.8 97.9 96.1   Ventilator Settings: Vent Mode:  [-] PRVC FiO2 (%):  [30 %-50 %] 30 % Set Rate:  [15 bmp] 15 bmp Vt Set:  [500 mL] 500 mL PEEP:  [5 cmH20] 5 cmH20 Plateau Pressure:  [19 cmH20-20 cmH20] 19 cmH20 CXR:  Bilateral opacities, ETT pulled back in good position ETT:  10/26 >>>  A:  Acute on chronic shortness of breath, ?respiratory arrest. PNA P:   - SBTs but hold off extubation pending EP eval - Cover for CAP - Respiratory culture     CARDIOVASCULAR  Lab 08/09/12 0520 08/08/12 2240  TROPONINI <0.30 <0.30  LATICACIDVEN -- --  PROBNP -- --   ECG:  Afib, rate 123 Lines: 3 PIVs  A: Asystolic arrest, etiology unclear - ? Sick sinus, also consider hypoxia, aspiration, vagal. Chronic afib P:  - Cardiac monitoring - EP eval - Hold home amiodarone if brady episodes   RENAL  Lab 08/09/12 0520 08/08/12 2240 08/08/12 1210 08/08/12 0740  NA 142 140 -- 139  K 3.7 3.8 -- --  CL 105 102 -- 101  CO2 29 29 -- 31  BUN 18 15 -- 13  CREATININE 0.86 0.68 0.60 0.62  CALCIUM 8.4 8.3* -- 9.0  MG -- 1.8 1.9 --  PHOS -- -- -- --   Intake/Output      10/26 0701 - 10/27 0700 10/27 0701 - 10/28 0700   P.O. 240    I.V. (mL/kg) 165.1 (2.6)    Total Intake(mL/kg) 405.1 (6.4)    Urine (mL/kg/hr) 310 (0.2)    Total Output 310    Net +95.1         Urine Occurrence 2 x    Stool Occurrence 1 x     Foley:  10/26>>  A:  Stable P:  D5NS @ 50/h  GASTROINTESTINAL  Lab 08/09/12 0520 08/08/12 2240 08/08/12 0740  AST 150* 186* 20  ALT 105* 123* 13  ALKPHOS 101 111 122*  BILITOT 1.2 1.0 1.4*  PROT 5.6* 5.9* 6.9  ALBUMIN 2.9* 3.0* 3.6    A:  Transaminitis after  cardiac arrest P:   - follow LFTs   HEMATOLOGIC  Lab 08/09/12 0520 08/08/12 2240 08/08/12 1210 08/08/12 0740  HGB 9.3* 9.9* 10.7* 11.1*  HCT 27.0* 29.3* 31.3* 32.7*  PLT 292 305 200 319  INR -- 1.29 -- 1.08  APTT -- 42* -- --   A:  Macrocytic anemia P:  Check B12 and Folate  INFECTIOUS  Lab 08/09/12 0520 08/08/12 2240 08/08/12 1210 08/08/12 0740  WBC 10.3 11.0* 8.9 9.4  PROCALCITON -- -- -- --   Cultures: Blood 10/26>>> Sputum 10/26>>> Antibiotics: Ceftriaxone 10/26>>> Azithromycin 10/26>>>  A:  Possible CAP P:   - Continue ceftriaxone and azithromycin - F/u cultures -Check UA and trach aspirate   NEUROLOGIC  A:  Awake and agitate after cardiac arrest P:   - Sedation with propofol and prn fentanyl. -Resume home meds - oxycodone, mirtazapine, doxepin, flomax, avodart, ASA , melatonin Hold restoril (recently started)  BEST PRACTICE / DISPOSITION Level of Care:  Floor > ICU Primary Service:  PCCM Consultants:  none Code Status:  Full Diet:  NPO DVT Px:  lovenox GI Px:  protonix Skin Integrity:  No issues Social / Family:  Daughter updated at bedside in detail  CC time 40 min  Oretha Milch., M.D. Pulmonary and Critical Care Medicine [facility] Pager: [phone]  08/09/2012, 8:58 AM

## 2012-08-09 NOTE — Consult Note (Signed)
ELECTROPHYSIOLOGY CONSULT NOTE  Patient ID: [name], MRN: [id number], DOB/AGE: [birth year] 78 y.o. Admit date: 08/08/2012 Date of Consult: 08/09/2012  Primary Physician: Ginette Otto, MD Primary Cardiologist:    Chief Complaint: bradycardic event  HPI [name] is a 78 y.o. male  with a known history of atrial fibrillation for which he was on amiodarone who presented to the hospital yesterday because of shortness of breath cough and wheezing. This had been subacute in onset over few days; there had been no fever or chills. He was noted in the emergency room to be using accessory muscles of respiration. He had a low-grade temperature and was admitted.  The ensuing events are unclear. The only note that I can find once from the code note. He is apparently being helped to the bathroom and was then noted to choke on his secretions becoming unresponsive he did not resume respirations on his own and a code was called. He was subsequently intubated. Oxygenation has not been a major issue.   Electrocardiogram on arrival had demonstrated sinus rhythm with bifascicular block. Telemetry prior to the event appeared to have atrial fibrillation/atypical flutter . An electrocardiogram dated yesterday p.m. showed atrial fibrillation; QRS duration had been from 140-156 milliseconds  Cardiac enzymes are normal.electrolytes electrolytes were also normal. He was noted parenthetically to have a macrocytic anemia.  His past medical history is notable for syncope "related to sinus pause"  Past Medical History  Diagnosis Date  . Atrial fibrillation with slow ventricular response     Symptoms of weakness  . Paroxysmal atrial fibrillation     Hx of, currently on amiodarone load  . Syncope     Probably related to sinus pause, finally conversion from atrial fibrillation to sinus rhythm  . COPD (chronic obstructive pulmonary disease)   . Hypertension   . Abnormal CXR 07/06/2007; 06/15/2010   Eleveted R HD 07/06/2007 > no change 09/92/2011  . Chronic back pain   . Esophageal diverticulum 08/08/2012    Large R sided per esophagram      Surgical History:  Past Surgical History  Procedure Date  . Joint replacement      Home Meds: Prior to Admission medications   Medication Sig Start Date End Date Taking? Authorizing Provider  amiodarone (PACERONE) 200 MG tablet Take 200 mg by mouth daily.   Yes Historical Provider, MD  aspirin EC 81 MG tablet Take 81 mg by mouth daily.   Yes Historical Provider, MD  Calcium Carb-Cholecalciferol (CALCIUM + D3) 600-200 MG-UNIT TABS Take 1 tablet by mouth daily.   Yes Historical Provider, MD  cholecalciferol (VITAMIN D) 1000 UNITS tablet Take 1,000 Units by mouth daily.   Yes Historical Provider, MD  doxepin (SINEQUAN) 10 MG capsule Take 10 mg by mouth at bedtime.   Yes Historical Provider, MD  dutasteride (AVODART) 0.5 MG capsule Take 0.5 mg by mouth daily.   Yes Historical Provider, MD  Melatonin 5 MG CAPS Take 1 capsule by mouth daily.   Yes Historical Provider, MD  mirtazapine (REMERON) 7.5 MG tablet Take 7.5 mg by mouth at bedtime.   Yes Historical Provider, MD  omeprazole (PRILOSEC) 20 MG capsule Take 20 mg by mouth daily.   Yes Historical Provider, MD  oxycodone (ROXICODONE) 30 MG immediate release tablet Take 60 mg by mouth every 8 (eight) hours.   Yes Historical Provider, MD  polyethylene glycol (MIRALAX / GLYCOLAX) packet Take 17 g by mouth daily.   Yes Historical Provider, MD  sodium  chloride (OCEAN) 0.65 % nasal spray Place 2 sprays into the nose 3 (three) times daily as needed. For dryness.   Yes Historical Provider, MD  Tamsulosin HCl (FLOMAX) 0.4 MG CAPS Take 0.4 mg by mouth daily.   Yes Historical Provider, MD  temazepam (RESTORIL) 30 MG capsule Take 30 mg by mouth at bedtime.   Yes Historical Provider, MD    Inpatient Medications:    . amiodarone  200 mg Oral Daily  . antiseptic oral rinse  15 mL Mouth Rinse QID  . aspirin  81  mg Per Tube Daily  . azithromycin  500 mg Intravenous Q24H  . cefTRIAXone (ROCEPHIN)  IV  1 g Intravenous Q24H  . chlorhexidine  15 mL Mouth Rinse BID  . enoxaparin (LOVENOX) injection  40 mg Subcutaneous Q24H  . fentaNYL      . LORazepam  0.5 mg Oral Once  . midazolam      . midazolam  2-4 mg Intravenous Once  . mirtazapine  7.5 mg Oral QHS  . pantoprazole sodium  40 mg Per Tube Q1200  . propofol      . sodium chloride  500 mL Intravenous Once  . sodium chloride  3 mL Intravenous Q12H  . DISCONTD: aspirin EC  81 mg Oral Daily  . DISCONTD: dextromethorphan-guaiFENesin  2 tablet Oral BID  . DISCONTD: doxazosin  4 mg Oral QHS  . DISCONTD: enoxaparin  40 mg Subcutaneous Q24H  . DISCONTD: ipratropium  0.5 mg Nebulization Q6H  . DISCONTD: levalbuterol  0.63 mg Nebulization Q6H  . DISCONTD: mirtazapine  7.5 mg Oral QHS  . DISCONTD: pantoprazole (PROTONIX) IV  40 mg Intravenous Daily  . DISCONTD: pseudoephedrine  120 mg Oral BID     Allergies: No Known Allergies  History   Social History  . Marital Status: Divorced    Spouse Name: N/A    Number of Children: N/A  . Years of Education: N/A   Occupational History  . Retired Forensic scientist    Social History Main Topics  . Smoking status: Former Smoker -- 3.0 packs/day for 15 years    Quit date: 10/15/1963  . Smokeless tobacco: Not on file  . Alcohol Use: 3.5 oz/week    7 drink(s) per week  . Drug Use: No  . Sexually Active: Not on file   Other Topics Concern  . Not on file   Social History Narrative   DivorcedLives aloneHas children     Family History  Problem Relation Age of Onset  . Heart disease Brother     Atrial fibrillation  . Atopy Neg Hx      ROS:  Please see the history of present illness.   avaiolable only from the chart   All other systems reviewed and negative.    Physical Exam:pt intubated on vent Blood pressure 120/72, pulse 80, temperature 98.5 F (36.9 C), temperature source Oral, resp. rate  20, height 5\' 9"  (1.753 m), weight 140 lb (63.504 kg), SpO2 100.00%. General: Well developed, well nourished male  Head: Normocephalic, atraumatic, sclera non-icteric, no xanthomas, nares are without discharge. Lymph Nodes:  none Back:not examined Neck: Negative for carotid bruits. JVD not elevated. Lungs: Clear bilaterally to auscultation without wheezes, rales, or rhonchi. Breathing is unlabored. Heart:  iRREGULAR Rate andrhythma no murmur , rubs, or gallops appreciated. Abdomen: Soft, non-tender, non-distended with normoactive bowel sounds. No hepatomegaly. No rebound/guarding. No obvious abdominal masses. Msk:  Strength and tone appear normal for age. Extremities: No clubbing or cyanosis.  No edema.  Distal pedal pulses are 2+ and equal bilaterally. Skin: Warm and Dry Neuro: paralyzed and sedated Psych:  Responds to questions appropriately with a normal affect.      Labs: Cardiac Enzymes  Basename 08/09/12 0520 08/08/12 2240  CKTOTAL 141 129  CKMB 3.9 3.8  TROPONINI <0.30 <0.30   CBC Lab Results  Component Value Date   WBC 10.3 08/09/2012   HGB 9.3* 08/09/2012   HCT 27.0* 08/09/2012   MCV 104.7* 08/09/2012   PLT 292 08/09/2012   PROTIME:  Basename 08/08/12 2240 08/08/12 0740  LABPROT 15.8* 13.9  INR 1.29 1.08   Chemistry  Lab 08/09/12 0520  NA 142  K 3.7  CL 105  CO2 29  BUN 18  CREATININE 0.86  CALCIUM 8.4  PROT 5.6*  BILITOT 1.2  ALKPHOS 101  ALT 105*  AST 150*  GLUCOSE 101*   Lipids Lab Results  Component Value Date   CHOL 157 01/01/2007   HDL 55.1 01/01/2007   LDLCALC 91 01/01/2007   TRIG 55 01/01/2007   BNP Pro B Natriuretic peptide (BNP)  Date/Time Value Range Status  04/08/2012  4:49 AM 742.0* 0 - 450 pg/mL Final  06/15/2010  2:20 PM 49.8  0.0-100.0 pg/mL Final  03/23/2010 10:00 PM 184.0* 0.0 - 100.0 pg/mL Final   Miscellaneous No results found for this basename: DDIMER    Radiology/Studies:  Dg Chest 2 View  08/08/2012  *RADIOLOGY  REPORT*  Clinical Data: Wheezing, shortness of breath  CHEST - 2 VIEW  Comparison:   the previous day's study  Findings: There has been development   of some patchy coarse interstitial and airspace opacities in the lower lung zones, left greater than right.  No effusion.  Heart size upper limits normal for technique.  Regional bones unremarkable.  Low lung volumes.  IMPRESSION:  1.  Patchy bibasilar infiltrates or atelectasis, left greater than right   Original Report Authenticated By: Osa Craver, M.D.    Dg Chest Port 1 View  08/09/2012  *RADIOLOGY REPORT*  Clinical Data: Endotracheal tube replacement. Respiratory and heart failure, COPD, shortness of breath.  PORTABLE CHEST - 1 VIEW  Comparison: 08/09/2012 and prior chest radiographs  Findings: Upper limits normal heart size noted. An endotracheal tube is identified 1.5 cm above the carina. Mild interstitial opacities are again identified. Left lower lung retrocardiac opacity is again identified - question atelectasis versus airspace disease. An NG tube is identified with tip overlying the mid stomach. Other support apparatus are unchanged. There is no evidence of pneumothorax.  IMPRESSION: Endotracheal tube tip 1.5 cm above the carina, otherwise unchanged chest radiograph as described.   Original Report Authenticated By: Rosendo Gros, M.D.    Dg Chest Port 1 View  08/09/2012  *RADIOLOGY REPORT*  Clinical Data: New endotracheal tube and OG tube placements.  PORTABLE CHEST - 1 VIEW  Comparison: 08/08/2012  Findings: Endotracheal tube positioned with tip about 1.5 cm above the carina.  Enteric tube tip is not visible but is below the left hemidiaphragm consistent with location in the stomach.  The shallow inspiration.  Borderline heart size and pulmonary vascularity suggesting mild congestion.  Mild atelectasis in the lung bases. No focal consolidation.  No pneumothorax.  No blunting of costophrenic angles.  IMPRESSION: Endotracheal tube tip  measures about 1.5 cm above the carina. Enteric tube tip is below the left hemidiaphragm.   Original Report Authenticated By: Marlon Pel, M.D.    Dg Chest Port 1  View  08/08/2012  *RADIOLOGY REPORT*  Clinical Data: Status post code  PORTABLE CHEST - 1 VIEW  Comparison: 08/08/2012 at 0826 hours  Findings: Endotracheal tube preferentially intubates the right mainstem bronchus.  Withdrawal approximately 3 cm is suggested.  Low lung volumes.  Left basilar opacity.  Chronic interstitial markings. No definite pneumothorax is seen.  The heart is top normal in size.  Defibrillator pad overlies the mediastinum.  IMPRESSION: Endotracheal tube preferentially intubates the right mainstem bronchus.  Withdrawal approximately 3 cm is suggested.   Original Report Authenticated By: Charline Bills, M.D.     EKG:  Were reviewed. Bifascicular block with sinus rhythm initially in with atrial fibrillation subsequently.           Assessment and Plan:  The patient has a bradycardic arrest. The description is of him walking to the bathroom becoming short of breath and collapsing. CPR was then undertaken and he was noted to be profoundly bradycardic. This is consistent with a telemetry review. It appeared that he was in atrial fibrillation at the time of his arrest which was different from the rhythm on arrival to the hospital. Prior telemetry suggest possibly MAT.  Dr. Reginia Naas concern is that there is not sufficient pulmonary disease based on his oxygenation requirements and x-rays to explain a pulmonary process triggering a secondary asystolic event. The history though to me suggest that is in fact what happened although a different explanation could be that the arrest i.e. bradycardia begins in the coughing and choking on secretions was in fact the beginning of collapse. Talking to the nurse might be of help to get a sense of the time frame. Still, it would be unlikely to have serendipitously such a  significant event in the context of bronchitis cough choking and fever that are not causally related in a patient who does not have an antecedent history to suggest bradycardia arrhythmias. The latter in fact may not be true as noted above in the past medical history there is a mention of syncope thought to related to bradycardia.  I think at this point watching is the most appropriate therapy. Pacing can be undertaken if a primary arrhythmia is identified.  Thank you for the consultation.Waldon Reining  Sherryl Manges, MD 08/09/2012 1:55 PM

## 2012-08-09 NOTE — Progress Notes (Signed)
Pt has had an eventful night thus far.  Pt received from 5 th floor on vent post coding.  RT called multiple times d/t ETT placement and pt decreased sats.  PT ETT needed to be replaced.  Pt tol. Well.

## 2012-08-10 ENCOUNTER — Inpatient Hospital Stay (HOSPITAL_COMMUNITY): Payer: Medicare Other

## 2012-08-10 DIAGNOSIS — R0602 Shortness of breath: Secondary | ICD-10-CM

## 2012-08-10 LAB — BLOOD GAS, ARTERIAL
Acid-Base Excess: 2.1 mmol/L — ABNORMAL HIGH (ref 0.0–2.0)
Bicarbonate: 28.1 mEq/L — ABNORMAL HIGH (ref 20.0–24.0)
Drawn by: 317871
Drawn by: 317871
MECHVT: 500 mL
O2 Saturation: 98.8 %
PEEP: 5 cmH2O
PEEP: 5 cmH2O
Patient temperature: 98.6
RATE: 15 resp/min
RATE: 15 resp/min
pCO2 arterial: 48.4 mmHg — ABNORMAL HIGH (ref 35.0–45.0)
pH, Arterial: 7.369 (ref 7.350–7.450)
pH, Arterial: 7.431 (ref 7.350–7.450)

## 2012-08-10 LAB — BASIC METABOLIC PANEL
BUN: 20 mg/dL (ref 6–23)
CO2: 28 mEq/L (ref 19–32)
Chloride: 108 mEq/L (ref 96–112)
Creatinine, Ser: 0.89 mg/dL (ref 0.50–1.35)
Glucose, Bld: 93 mg/dL (ref 70–99)
Potassium: 3 mEq/L — ABNORMAL LOW (ref 3.5–5.1)

## 2012-08-10 LAB — CBC
HCT: 26.3 % — ABNORMAL LOW (ref 39.0–52.0)
Hemoglobin: 9 g/dL — ABNORMAL LOW (ref 13.0–17.0)
MCH: 35.9 pg — ABNORMAL HIGH (ref 26.0–34.0)
MCHC: 34.2 g/dL (ref 30.0–36.0)
MCV: 104.8 fL — ABNORMAL HIGH (ref 78.0–100.0)
RDW: 14.6 % (ref 11.5–15.5)

## 2012-08-10 LAB — FOLATE RBC: RBC Folate: 1017 ng/mL — ABNORMAL HIGH (ref >=366)

## 2012-08-10 MED ORDER — FENTANYL CITRATE 0.05 MG/ML IJ SOLN
25.0000 ug | INTRAMUSCULAR | Status: DC | PRN
  Administered 2012-08-10 (×2): 50 ug via INTRAVENOUS
  Filled 2012-08-10 (×2): qty 2

## 2012-08-10 MED ORDER — POTASSIUM CHLORIDE 20 MEQ/15ML (10%) PO LIQD
40.0000 meq | Freq: Once | ORAL | Status: AC
  Administered 2012-08-10: 40 meq
  Filled 2012-08-10: qty 30

## 2012-08-10 MED ORDER — FUROSEMIDE 10 MG/ML IJ SOLN
40.0000 mg | Freq: Four times a day (QID) | INTRAMUSCULAR | Status: AC
Start: 2012-08-10 — End: 2012-08-10
  Administered 2012-08-10 (×2): 40 mg via INTRAVENOUS
  Filled 2012-08-10 (×2): qty 4

## 2012-08-10 NOTE — Progress Notes (Signed)
Pt extubated pr MD order.  Pt stable throughout procedure, placed on 4L nasal can.

## 2012-08-10 NOTE — Progress Notes (Signed)
CARE MANAGEMENT NOTE 08/10/2012  Patient:  [name]   Account Number:  [id number]  Date Initiated:  08/10/2012  Documentation initiated by:  Zenia Guest  Subjective/Objective Assessment:   patient admitted onto reg med floor with pna, was found unresponsvie and pulseless rapid resp and rescuitiation succuessful transfereed to icu     Action/Plan:   from home,  does have cardiac history   Anticipated DC Date:  08/13/2012   Anticipated DC Plan:  HOME/SELF CARE  In-house referral  NA      DC Planning Services  NA      [facility] Choice  NA   Choice offered to / List presented to:  NA   DME arranged  NA      DME agency  NA     HH arranged  NA      HH agency  NA   Status of service:  In process, will continue to follow Medicare Important Message given?  NA - LOS <3 / Initial given by admissions (If response is "NO", the following Medicare IM given date fields will be blank) Date Medicare IM given:   Date Additional Medicare IM given:    Discharge Disposition:    Per UR Regulation:  Reviewed for med. necessity/level of care/duration of stay  If discussed at Long Length of Stay Meetings, dates discussed:    Comments:  [id number] Earlene Plater, RN, BSN, CCM: CHART REVIEWED AND UPDATED. NO DISCHARGE NEEDS PRESENT AT THIS TIME. CASE MANAGEMENT [phone]

## 2012-08-10 NOTE — Progress Notes (Signed)
Pt extubated at this time

## 2012-08-10 NOTE — Progress Notes (Addendum)
Name: [name] MRN: [id number] DOB: 24-Feb-1922    LOS: 2  Referring Provider:  Rodolph Bong, MD Reason for Referral:  Cardiac Arrest PCP- [facility]  PULMONARY / CRITICAL CARE MEDICINE  HPI:  78 yo M with afib on amio (no coumadin for fall risk) , HTN and recent admission for CAP in 03/2012 who was admitted 10/26 with worsening shortness of breath, cough productive of clear sputum and wheezing.  He was noted to have a temperature to 100.62F.  Chest x-ray showed bibasilar opacities.  He was reluctant to be admitted but agreed to come into the hospital overnight.  He was started on ceftriaxone and azithromycin.    10/26 evening he was noted to be agitated.  He received 0.5 mg ativan. He later used the bedside and was noted to have difficulty breathing and was chocking on his secretions.  He then became unresponsive and was found to be pulseless.  Chest compression started and he was found to be in asystole.  He received 2 rounds of epi and one amp of bicarb with ROSC- downtime 9 mins by code sheet.    Events Since Admission: Awake, f/u   Current Status: stable Vital Signs: Temp:  [97.8 F (36.6 C)-100.3 F (37.9 C)] 100.3 F (37.9 C) (10/28 0800) Pulse Rate:  [64-89] 87  (10/28 0800) Resp:  [15-27] 26  (10/28 0800) BP: (74-147)/(45-84) 128/69 mmHg (10/28 0800) SpO2:  [99 %-100 %] 99 % (10/28 0800) FiO2 (%):  [30 %] 30 % (10/28 0800)  Physical Examination: General:  Awake, follows commands. Seems oriented  Neuro:  No focal def HEENT: Sclera clear, ETT in place Neck:  supple Cardiovascular:  Irreg, irreg, + S3 Lungs:  R>L rhonchi +/- rub Abdomen:  Soft, NT, ND Musculoskeletal:  joings wnl Skin:  No rash  Principal Problem:  *Community acquired pneumonia Active Problems:  HYPERTENSION, BENIGN  Atrial fibrillation  DIASTOLIC HEART FAILURE, CHRONIC  DYSPNEA  COPD (chronic obstructive pulmonary disease)  Anemia  Cardiac arrest   ASSESSMENT AND  PLAN  PULMONARY  Lab 08/08/12 2347 08/08/12 2246 08/08/12 1014  PHART 7.431 7.369 7.430  PCO2ART 43.0 48.4* 44.0  PO2ART 168.0* 194.0* 74.7*  HCO3 28.1* 27.3* 28.6*  O2SAT 98.8 97.9 96.1   Ventilator Settings: Vent Mode:  [-] PRVC FiO2 (%):  [30 %] 30 % Set Rate:  [15 bmp] 15 bmp Vt Set:  [500 mL] 500 mL PEEP:  [5 cmH20] 5 cmH20 Pressure Support:  [13 cmH20] 13 cmH20 Plateau Pressure:  [17 cmH20-20 cmH20] 20 cmH20 CXR:  Bilateral opacities, R>L, aeration a little better. ETT at the carina, will pull back 1 cm.  ETT:  10/26 >>>  A:  Acute on chronic shortness of breath, ?respiratory arrest. PNA CXR demonstrating R>L airspace disease. Cultures remain pending. Aeration perhaps marginally better.  P:   - SBT, if passes will extubate - cont CAP coverage      CARDIOVASCULAR  Lab 08/09/12 0520 08/08/12 2240  TROPONINI <0.30 <0.30  LATICACIDVEN -- --  PROBNP -- --   ECG:  Afib, rate 123 Lines: 3 PIVs  A: Asystolic arrest, etiology unclear - ? Sick sinus, also consider hypoxia, aspiration, vagal. Chronic afib Seen by EP. They feel that this was precipitated by coughing, thick sputum and resultant brady event but do note that he has had syncopal event in past.   P:  - Cardiac monitoring - he remains on amio, however should he have recurrent brady event would stop this and  seek alternative choice.    RENAL  Lab 08/10/12 0351 08/09/12 0520 08/08/12 2240 08/08/12 1210 08/08/12 0740  NA 143 142 140 -- 139  K 3.0* 3.7 -- -- --  CL 108 105 102 -- 101  CO2 28 29 29  -- 31  BUN 20 18 15  -- 13  CREATININE 0.89 0.86 0.68 0.60 0.62  CALCIUM 8.2* 8.4 8.3* -- 9.0  MG -- -- 1.8 1.9 --  PHOS -- -- -- -- --   Intake/Output      10/27 0701 - 10/28 0700 10/28 0701 - 10/29 0700   P.O.     I.V. (mL/kg) 1446.2 (22.8) 60 (0.9)   IV Piggyback 50    Total Intake(mL/kg) 1496.2 (23.6) 60 (0.9)   Urine (mL/kg/hr) 318 (0.2) 30   Emesis/NG output 525    Total Output 843 30   Net +653.2  +30         Foley:  10/26>>  A:   Hypokalemia P:  Replace and recheck  GASTROINTESTINAL  Lab 08/09/12 0520 08/08/12 2240 08/08/12 0740  AST 150* 186* 20  ALT 105* 123* 13  ALKPHOS 101 111 122*  BILITOT 1.2 1.0 1.4*  PROT 5.6* 5.9* 6.9  ALBUMIN 2.9* 3.0* 3.6    A:  Transaminitis after cardiac arrest Slowly improving.  P:   - follow LFTs   HEMATOLOGIC  Lab 08/10/12 0351 08/09/12 0520 08/08/12 2240 08/08/12 1210 08/08/12 0740  HGB 9.0* 9.3* 9.9* 10.7* 11.1*  HCT 26.3* 27.0* 29.3* 31.3* 32.7*  PLT 279 292 305 200 319  INR -- -- 1.29 -- 1.08  APTT -- -- 42* -- --   A:  Macrocytic anemia B12 WNL P:  F/u folate.   INFECTIOUS  Lab 08/10/12 0351 08/09/12 0520 08/08/12 2240 08/08/12 1210 08/08/12 0740  WBC 6.9 10.3 11.0* 8.9 9.4  PROCALCITON -- -- -- -- --   Cultures: Blood 10/26>>> Sputum 10/26>>> MRSA nasal 10/26: negative  U strep and legionella antigens: negative   Antibiotics: Ceftriaxone 10/26>>> Azithromycin 10/26>>>  A:  Possible CAP  P:   - Continue ceftriaxone and azithromycin - F/u cultures   NEUROLOGIC  A:  Awake and agitate after cardiac arrest  P:   -Resumed home meds - oxycodone, mirtazapine, doxepin, flomax, avodart, ASA , melatonin Hold restoril (recently started)  BEST PRACTICE / DISPOSITION Level of Care:  Floor > ICU Primary Service:  PCCM Consultants:  none Code Status:  Full Diet:  NPO DVT Px:  lovenox GI Px:  protonix Skin Integrity:  No issues Social / Family:  Daughter updated at bedside in detail   BABCOCK,PETE,08/10/2012, 8:28 AM  Attending:  I have seen and examined the patient with nurse practitioner/resident and agree with the note above.   Uncertain etiology at time of cardiac arrest, by history it sounds that he had a choking/near respiratory arrest that led to the event.  Hopefully after extubation he will be able to provide [place] with history.  Right now he is tolerating an SBT, no wheezing on exam.  Extubate  this morning, continue antibiotics.  Appreciate cardiology recs.  Cc time 40 minutes.  Yolonda Kida PCCM Pager: [phone] Cell: [phone] If no response, call [phone]

## 2012-08-10 NOTE — Progress Notes (Signed)
   Subjective:  Intubated; alert  Objective:  Filed Vitals:   08/10/12 0400 08/10/12 0500 08/10/12 0600 08/10/12 0700  BP: 139/72 136/70 147/83 134/70  Pulse: 71 76 82 85  Temp: 99.1 F (37.3 C)     TempSrc: Oral     Resp: 15 20 24 21   Height:      Weight:      SpO2: 100% 100% 100% 100%    Intake/Output from previous day:  Intake/Output Summary (Last 24 hours) at 08/10/12 0754 Last data filed at 08/10/12 0700  Gross per 24 hour  Intake 1496.18 ml  Output    843 ml  Net 653.18 ml    Physical Exam: Physical exam: Well-developed well-nourished in no acute distress; intubated Skin is warm and dry.  HEENT is normal.  Neck is supple.  Chest with rhonci Cardiovascular exam is irregular Abdominal exam nontender or distended. No masses palpated. Extremities show no edema. neuro grossly intact    Lab Results: Basic Metabolic Panel:  Basename 08/10/12 0351 08/09/12 0520 08/08/12 2240 08/08/12 1210  NA 143 142 -- --  K 3.0* 3.7 -- --  CL 108 105 -- --  CO2 28 29 -- --  GLUCOSE 93 101* -- --  BUN 20 18 -- --  CREATININE 0.89 0.86 -- --  CALCIUM 8.2* 8.4 -- --  MG -- -- 1.8 1.9  PHOS -- -- -- --   CBC:  Basename 08/10/12 0351 08/09/12 0520 08/08/12 2240  WBC 6.9 10.3 --  NEUTROABS -- 8.7* 8.9*  HGB 9.0* 9.3* --  HCT 26.3* 27.0* --  MCV 104.8* 104.7* --  PLT 279 292 --   Cardiac Enzymes:  Basename 08/09/12 0520 08/08/12 2240  CKTOTAL 141 129  CKMB 3.9 3.8  CKMBINDEX -- --  TROPONINI <0.30 <0.30     Assessment/Plan:  1 bradycardic arrest-possibly related to respiratory issues/vagal event. Telemetry reviewed in no recurrent bradycardia. We'll continue to monitor. 2 atrial fibrillation-patient has previously been felt not to be a Coumadin candidate. He remains in atrial fibrillation. Continue amiodarone. 3 elevated liver functions-possibly related to bradycardic arrest. Note liver functions were normal at the time of admission other than mildly elevated  alkaline phosphatase. 4 hypokalemia-would supplement 5 pneumonia-continue antibiotics. 6 VDRF-vent wean per critical care medicine  Olga Millers 08/10/2012, 7:54 AM

## 2012-08-10 NOTE — Procedures (Signed)
Extubation Procedure Note  Patient Details:   Name: RAJEEV ESCUE DOB: 07/02/1922 MRN: [id number]   Airway Documentation:     Evaluation  O2 sats:95 Complications: No apparent complications Patient did tolerate procedure well. Bilateral Breath Sounds: Rhonchi Suctioning: Airway Yes  Revonda Humphrey 08/10/2012, 10:21 AM

## 2012-08-11 ENCOUNTER — Inpatient Hospital Stay (HOSPITAL_COMMUNITY): Payer: Medicare Other

## 2012-08-11 DIAGNOSIS — J449 Chronic obstructive pulmonary disease, unspecified: Secondary | ICD-10-CM

## 2012-08-11 DIAGNOSIS — I5032 Chronic diastolic (congestive) heart failure: Secondary | ICD-10-CM

## 2012-08-11 LAB — COMPREHENSIVE METABOLIC PANEL
ALT: 52 U/L (ref 0–53)
AST: 34 U/L (ref 0–37)
CO2: 32 mEq/L (ref 19–32)
Calcium: 8.3 mg/dL — ABNORMAL LOW (ref 8.4–10.5)
Creatinine, Ser: 0.94 mg/dL (ref 0.50–1.35)
GFR calc Af Amer: 84 mL/min — ABNORMAL LOW (ref >90)
GFR calc non Af Amer: 72 mL/min — ABNORMAL LOW (ref >90)
Sodium: 142 mEq/L (ref 135–145)
Total Protein: 5.4 g/dL — ABNORMAL LOW (ref 6.0–8.3)

## 2012-08-11 LAB — CBC
MCH: 35.7 pg — ABNORMAL HIGH (ref 26.0–34.0)
MCHC: 33.3 g/dL (ref 30.0–36.0)
MCV: 107 fL — ABNORMAL HIGH (ref 78.0–100.0)
Platelets: 265 10*3/uL (ref 150–400)
RBC: 2.44 MIL/uL — ABNORMAL LOW (ref 4.22–5.81)

## 2012-08-11 LAB — BASIC METABOLIC PANEL
CO2: 30 mEq/L (ref 19–32)
Calcium: 8.3 mg/dL — ABNORMAL LOW (ref 8.4–10.5)
Glucose, Bld: 82 mg/dL (ref 70–99)
Potassium: 4.3 mEq/L (ref 3.5–5.1)
Sodium: 141 mEq/L (ref 135–145)

## 2012-08-11 LAB — CULTURE, RESPIRATORY W GRAM STAIN: Culture: NORMAL

## 2012-08-11 MED ORDER — ARFORMOTEROL TARTRATE 15 MCG/2ML IN NEBU
15.0000 ug | INHALATION_SOLUTION | Freq: Two times a day (BID) | RESPIRATORY_TRACT | Status: DC
  Administered 2012-08-11 – 2012-08-14 (×6): 15 ug via RESPIRATORY_TRACT
  Filled 2012-08-11 (×10): qty 2

## 2012-08-11 MED ORDER — DUTASTERIDE 0.5 MG PO CAPS: 0.5000 mg | ORAL_CAPSULE | Freq: Every day | ORAL | Status: DC

## 2012-08-11 MED ORDER — TAMSULOSIN HCL 0.4 MG PO CAPS
0.4000 mg | ORAL_CAPSULE | Freq: Every day | ORAL | Status: DC
  Administered 2012-08-11 – 2012-08-14 (×4): 0.4 mg via ORAL
  Filled 2012-08-11 (×4): qty 1

## 2012-08-11 MED ORDER — MOXIFLOXACIN HCL 400 MG PO TABS
400.0000 mg | ORAL_TABLET | Freq: Every day | ORAL | Status: DC
  Administered 2012-08-11 – 2012-08-13 (×3): 400 mg via ORAL
  Filled 2012-08-11 (×5): qty 1

## 2012-08-11 MED ORDER — PANTOPRAZOLE SODIUM 40 MG PO PACK
40.0000 mg | PACK | Freq: Every day | ORAL | Status: DC
  Administered 2012-08-11 – 2012-08-14 (×4): 40 mg via ORAL
  Filled 2012-08-11 (×4): qty 20

## 2012-08-11 MED ORDER — POTASSIUM CHLORIDE 20 MEQ/15ML (10%) PO LIQD
40.0000 meq | Freq: Two times a day (BID) | ORAL | Status: AC
  Administered 2012-08-11 – 2012-08-12 (×3): 40 meq via ORAL
  Filled 2012-08-11 (×4): qty 30

## 2012-08-11 MED ORDER — BUDESONIDE 0.25 MG/2ML IN SUSP
0.2500 mg | Freq: Two times a day (BID) | RESPIRATORY_TRACT | Status: DC
  Administered 2012-08-11 – 2012-08-14 (×6): 0.25 mg via RESPIRATORY_TRACT
  Filled 2012-08-11 (×10): qty 2

## 2012-08-11 MED ORDER — VITAMIN D3 25 MCG (1000 UNIT) PO TABS
1000.0000 [IU] | ORAL_TABLET | Freq: Every day | ORAL | Status: DC
  Administered 2012-08-11 – 2012-08-14 (×4): 1000 [IU] via ORAL
  Filled 2012-08-11 (×4): qty 1

## 2012-08-11 MED ORDER — POTASSIUM CHLORIDE 10 MEQ/100ML IV SOLN
INTRAVENOUS | Status: AC
  Administered 2012-08-11: 10 meq
  Filled 2012-08-11: qty 600

## 2012-08-11 MED ORDER — CALCIUM CARBONATE-VITAMIN D 500-200 MG-UNIT PO TABS
1.0000 | ORAL_TABLET | Freq: Every day | ORAL | Status: DC
  Administered 2012-08-11 – 2012-08-14 (×4): 1 via ORAL
  Filled 2012-08-11 (×4): qty 1

## 2012-08-11 MED ORDER — TEMAZEPAM 15 MG PO CAPS
30.0000 mg | ORAL_CAPSULE | Freq: Every evening | ORAL | Status: DC | PRN
  Administered 2012-08-12: 30 mg via ORAL
  Filled 2012-08-11: qty 2

## 2012-08-11 MED ORDER — OXYCODONE HCL 5 MG PO TABS
5.0000 mg | ORAL_TABLET | ORAL | Status: DC | PRN
  Administered 2012-08-11 – 2012-08-14 (×12): 5 mg via ORAL
  Filled 2012-08-11 (×12): qty 1

## 2012-08-11 MED ORDER — POTASSIUM CHLORIDE 10 MEQ/100ML IV SOLN
10.0000 meq | INTRAVENOUS | Status: AC
  Administered 2012-08-11 (×6): 10 meq via INTRAVENOUS

## 2012-08-11 MED ORDER — CALCIUM + D3 600-200 MG-UNIT PO TABS: 1.0000 | ORAL_TABLET | Freq: Every day | ORAL | Status: DC

## 2012-08-11 MED ORDER — POTASSIUM CHLORIDE CRYS ER 20 MEQ PO TBCR: 40.0000 meq | EXTENDED_RELEASE_TABLET | ORAL | Status: DC

## 2012-08-11 MED ORDER — TEMAZEPAM 7.5 MG PO CAPS
7.5000 mg | ORAL_CAPSULE | Freq: Once | ORAL | Status: AC
  Administered 2012-08-11: 7.5 mg via ORAL

## 2012-08-11 NOTE — Progress Notes (Addendum)
Name: [name] MRN: [id number] DOB: Jun 25, 1922    LOS: 3  Referring Provider:  Rodolph Bong, MD Reason for Referral:  Cardiac Arrest PCP- [facility], [facility]  PULMONARY / CRITICAL CARE MEDICINE  HPI:  78 yo M with afib on amio (no coumadin for fall risk) , HTN and recent admission for CAP in 03/2012 who was admitted 10/26 with worsening shortness of breath, cough productive of clear sputum and wheezing.  He was noted to have a temperature to 100.47F.  Chest x-ray showed bibasilar opacities.  He was reluctant to be admitted but agreed to come into the hospital overnight.  He was started on ceftriaxone and azithromycin.    10/26 evening he was noted to be agitated.  He received 0.5 mg ativan. He later used the bedside and was noted to have difficulty breathing and was chocking on his secretions.  He then became unresponsive and was found to be pulseless.  Chest compression started and he was found to be in asystole.  He received 2 rounds of epi and one amp of bicarb with ROSC- downtime 9 mins by code sheet.   Events Since Admission: Awake, no distress.   Current Status: stable Vital Signs: Temp:  [97.5 F (36.4 C)-99.8 F (37.7 C)] 97.8 F (36.6 C) (10/29 0400) Pulse Rate:  [74-104] 80  (10/29 0800) Resp:  [20-28] 25  (10/29 0800) BP: (96-131)/(57-93) 119/57 mmHg (10/29 0800) SpO2:  [95 %-100 %] 97 % (10/29 0800) Weight:  [62.9 kg (138 lb 10.7 oz)] 62.9 kg (138 lb 10.7 oz) (10/29 0400) 2 liters  Physical Examination: General:  Awake, follows commands.  Neuro:  No focal def, oriented X 3 HEENT: Sclera clear, ETT in place Neck:  supple Cardiovascular:  Irreg, irreg, + S3 Lungs:diffuse rhonchi. Weak cough  Abdomen:  Soft, NT, ND Musculoskeletal:  joings wnl Skin:  No rash  Principal Problem:  *Community acquired pneumonia Active Problems:  HYPERTENSION, BENIGN  Atrial fibrillation  DIASTOLIC HEART FAILURE, CHRONIC  DYSPNEA  COPD (chronic obstructive pulmonary  disease)  Anemia  Cardiac arrest   ASSESSMENT AND PLAN  PULMONARY  Lab 08/08/12 2347 08/08/12 2246 08/08/12 1014  PHART 7.431 7.369 7.430  PCO2ART 43.0 48.4* 44.0  PO2ART 168.0* 194.0* 74.7*  HCO3 28.1* 27.3* 28.6*  O2SAT 98.8 97.9 96.1   Ventilator Settings: Vent Mode:  [-]  PEEP:  [5 cmH20] 5 cmH20 Pressure Support:  [5 cmH20] 5 cmH20 CXR:  Low vol c/w atx s/p extubation. Improved aeration on the right  ETT:  10/26 >>>10/29  A:  Acute on chronic shortness of breath, ?respiratory arrest. PNA CXR demonstrating R>L airspace disease. Extubated on 10/29, and weaning FIO2. Need to have bed side swallow eval. Seems to have some trouble clearing secretions.  P:   - cont CAP coverage -bedside swallow eval -wean FIO2 -flutter valve, incentive spiro -out of bed -history of COPD, likely chronic bronchitis by history so add pulmicort and brovana    CARDIOVASCULAR  Lab 08/09/12 0520 08/08/12 2240  TROPONINI <0.30 <0.30  LATICACIDVEN -- --  PROBNP -- --   ECG:  10/26 Afib, rate 123; (HR 80 10/29 AM) Lines: 3 PIVs  A: Asystolic arrest, etiology unclear - ? Sick sinus, also consider hypoxia, aspiration, vagal. Chronic afib Seen by EP. They feel that this was precipitated by coughing, thick sputum and resultant brady event but do note that he has had syncopal event in past.   P:  - Cardiac monitoring-->will see how he does as we  advance activity  - cont current rx - he remains on amio, however should he have recurrent brady event would stop this and seek alternative choice.    RENAL  Lab 08/11/12 0335 08/10/12 0351 08/09/12 0520 08/08/12 2240 08/08/12 1210 08/08/12 0740  NA 142 143 142 140 -- 139  K 2.9* 3.0* -- -- -- --  CL 103 108 105 102 -- 101  CO2 32 28 29 29  -- 31  BUN 21 20 18 15  -- 13  CREATININE 0.94 0.89 0.86 0.68 0.60 --  CALCIUM 8.3* 8.2* 8.4 8.3* -- 9.0  MG -- -- -- 1.8 1.9 --  PHOS -- -- -- -- -- --   Intake/Output      10/28 0701 - 10/29 0700 10/29  0701 - 10/30 0700   I.V. (mL/kg) 1300 (20.7)    Other 225    IV Piggyback 200    Total Intake(mL/kg) 1725 (27.4)    Urine (mL/kg/hr) 2355 (1.6)    Emesis/NG output     Total Output 2355    Net -630          Foley:  10/26>>10/30  A:   Hypokalemia P:  Replace and recheck (add oral scheduled 10/29)  GASTROINTESTINAL  Lab 08/11/12 0335 08/09/12 0520 08/08/12 2240 08/08/12 0740  AST 34 150* 186* 20  ALT 52 105* 123* 13  ALKPHOS 89 101 111 122*  BILITOT 1.0 1.2 1.0 1.4*  PROT 5.4* 5.6* 5.9* 6.9  ALBUMIN 2.7* 2.9* 3.0* 3.6    A:  Transaminitis after cardiac arrest Slowly improving.  P:   - follow LFTs   HEMATOLOGIC  Lab 08/11/12 0335 08/10/12 0351 08/09/12 0520 08/08/12 2240 08/08/12 1210 08/08/12 0740  HGB 8.7* 9.0* 9.3* 9.9* 10.7* --  HCT 26.1* 26.3* 27.0* 29.3* 31.3* --  PLT 265 279 292 305 200 --  INR -- -- -- 1.29 -- 1.08  APTT -- -- -- 42* -- --   A:  Macrocytic anemia B12 WNL P:  No role for xfusion Trend CBC.   INFECTIOUS  Lab 08/11/12 0335 08/10/12 0351 08/09/12 0520 08/08/12 2240 08/08/12 1210  WBC 6.1 6.9 10.3 11.0* 8.9  PROCALCITON -- -- -- -- --   Cultures: Blood 10/26>>> Sputum 10/26: rare GNR, rare GPR>>>OPF MRSA nasal 10/26: negative  U strep and legionella antigens: negative   Antibiotics: Ceftriaxone 10/26>>>10/29 Azithromycin 10/26>>>10/29 avelox 10/29>>>  A:  Possible CAP  P:   - Continue CAP coverage  - F/u cultures   NEUROLOGIC  A:  Awake, physical deconditioning  after cardiac arrest      P:   -Resumed home meds - oxycodone, mirtazapine, doxepin, flomax, avodart, ASA , melatonin Hold restoril (recently started) -PT/OT eval  BEST PRACTICE / DISPOSITION Level of Care:  Floor > ICU-->SDU status  Primary Service:  PCCM Consultants:  none Code Status:  Full Diet:  NPO DVT Px:  lovenox GI Px:  protonix Skin Integrity:  No issues Social / Family:  Daughter updated at bedside in detail   BABCOCK,PETE,08/11/2012, 8:43  AM  Attending:  I have seen and examined the patient with nurse practitioner/resident and agree with and have edited the note above.   Yolonda Kida PCCM Pager: [phone] Cell: [phone] If no response, call [phone]

## 2012-08-11 NOTE — Clinical Social Work Psychosocial (Signed)
Clinical Social Work Department BRIEF PSYCHOSOCIAL ASSESSMENT 08/11/2012  Patient:  [name], [name]     Account Number:  [id number]     Admit date:  08/08/2012  Clinical Social Worker:  Jodelle Red  Date/Time:  08/11/2012 11:30 AM  Referred by:  Physician  Date Referred:  08/11/2012 Referred for  SNF Placement   Other Referral:   Interview type:  Patient Other interview type:   chart review, discussion with daughter over the phone    PSYCHOSOCIAL DATA Living Status:  ALONE Admitted from facility:   Level of care:   Primary support name:  Oscar La Primary support relationship to patient:  CHILD, ADULT Degree of support available:   good from daughter, has CNAs from [employer]    CURRENT CONCERNS Current Concerns  Adjustment to Illness  Other - See comment   Other Concerns:   possible placement    SOCIAL WORK ASSESSMENT / PLAN CSW met with Pt at length due to referral from NP for possible SNF. Pt lives alone at home and reports to get help from CNAs about 7 hours daily. Pt coded over the weekend and stated "I died the other day". Pt reflecting on this event and had many questions. RN, Adela Lank in room and pulled up Pt's chart in Epic. Pt had specific questions about when his event took place. He appeared to be considering when he would need his CNAs at his home in correlation to his time of arrest. Pt may want further discussion about his arrest from MD. Pt agreeable to 24/7 supervision at home from CNAs on his d/c. CSW discussed possibility of SNF and Pt stated he had been to [facility] in the past, but would prefer to go home.  PT eval and swallow eval pending. Pt agreeable to consider SNF and we will revisit after these evals.  Pt agreeable to CSW discussing d/c needs with daughter and she shared that her father is very strong willed and has not done well at SNF in the past. She really wants to leave the decision up to her father as it has caused  tension in the past.   Assessment/plan status:  Psychosocial Support/Ongoing Assessment of Needs Other assessment/ plan:   referral to chaplain to assist Pt in processing his near death experience  follow for possible SNF.   Information/referral to community resources:    PATIENT'S/FAMILY'S RESPONSE TO PLAN OF CARE: Pt and daughter appreciative and agreeable to CSW following for support and possible D/C needs. CSW shared case info with Susy Frizzle from chaplain office and plans to check in as well.  CSW to follow.    Doreen Salvage, LCSW ICU/Stepdown Clinical Social Worker [facility] Cell [phone] Hours 8am-1200pm M-F

## 2012-08-11 NOTE — Progress Notes (Signed)
   Subjective:  Intubated; alert  Objective:  Filed Vitals:   08/11/12 0400 08/11/12 0500 08/11/12 0600 08/11/12 0700  BP: 117/57 96/61 112/64 118/72  Pulse: 86 74 79 81  Temp: 97.8 F (36.6 C)     TempSrc: Oral     Resp: 26 20 27 25   Height:      Weight: 138 lb 10.7 oz (62.9 kg)     SpO2: 95% 96% 96% 97%    Intake/Output from previous day:  Intake/Output Summary (Last 24 hours) at 08/11/12 0740 Last data filed at 08/11/12 0700  Gross per 24 hour  Intake   1725 ml  Output   2355 ml  Net   -630 ml    Physical Exam: Physical exam: Well-developed well-nourished in no acute distress; intubated Skin is warm and dry.  HEENT is normal.  Neck is supple.  Chest with rhonci Cardiovascular exam is irregular Abdominal exam nontender or distended. No masses palpated. Extremities show no edema. neuro grossly intact    Lab Results: Basic Metabolic Panel:  Basename 08/11/12 0335 08/10/12 0351 08/08/12 2240 08/08/12 1210  NA 142 143 -- --  K 2.9* 3.0* -- --  CL 103 108 -- --  CO2 32 28 -- --  GLUCOSE 94 93 -- --  BUN 21 20 -- --  CREATININE 0.94 0.89 -- --  CALCIUM 8.3* 8.2* -- --  MG -- -- 1.8 1.9  PHOS -- -- -- --   CBC:  Basename 08/11/12 0335 08/10/12 0351 08/09/12 0520 08/08/12 2240  WBC 6.1 6.9 -- --  NEUTROABS -- -- 8.7* 8.9*  HGB 8.7* 9.0* -- --  HCT 26.1* 26.3* -- --  MCV 107.0* 104.8* -- --  PLT 265 279 -- --   Cardiac Enzymes:  Basename 08/09/12 0520 08/08/12 2240  CKTOTAL 141 129  CKMB 3.9 3.8  CKMBINDEX -- --  TROPONINI <0.30 <0.30     Assessment/Plan:  1 bradycardic arrest-possibly related to respiratory issues/vagal event. Telemetry reviewed in no recurrent bradycardia. We'll continue to monitor. No indication for pacemaker. 2 atrial fibrillation-patient has previously been felt not to be a Coumadin candidate because of recurrent falls. He remains in atrial fibrillation. Continue amiodarone. Continue ASA. FU Dr Shirlee Latch at DC. 3 elevated  liver functions-possibly related to bradycardic arrest. Note liver functions were normal at the time of admission other than mildly elevated alkaline phosphatase and are improved this AM. 4 hypokalemia-would supplement 5 pneumonia-continue antibiotics. 6 VDRF-vent wean per critical care medicine Patient can be transferred from cardiac standpoint. Olga Millers 08/11/2012, 7:40 AM

## 2012-08-11 NOTE — Evaluation (Addendum)
Clinical/Bedside Swallow Evaluation Patient Details  Name: [name] MRN: [id number] Date of Birth: 1922-10-02  Today's Date: 08/11/2012 Time: 4098-1191 SLP Time Calculation (min): 66 min  Past Medical History:  Past Medical History  Diagnosis Date  . Atrial fibrillation with slow ventricular response     Symptoms of weakness  . Paroxysmal atrial fibrillation     Hx of, currently on amiodarone load  . Syncope     Probably related to sinus pause, finally conversion from atrial fibrillation to sinus rhythm  . COPD (chronic obstructive pulmonary disease)   . Hypertension   . Abnormal CXR 07/06/2007; 06/15/2010    Eleveted R HD 07/06/2007 > no change 09/92/2011  . Chronic back pain   . 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 Joint replacement    HPI:  78 yo male adm to [facility] 08/08/12 with shortness of breath - pt coughing on secretions and had fever.  Pt with hospital admission for CAP 04/02/12.  On 10/26 pt was given Ativan due to agitation and was noted be be choking on secretions - became unresponsive - pulseless - cardiac arrest.  Pt required intubation 10/26 extubated 10/29.  Clinical swallow evaluation was ordered on 08/11/12.  Pt also has h/o COPD.  CXR 10/29 indicated worsening left patchy airspace disease and slight improved right upper lobe aeration.  Pt has problems clearing secretions currently but he does not have awareness to this issue.  Pt reports weight loss of 45 pounds over the last few years with poor appetite.  Esophagrams have been ordered previously by Dr Pete Glatter due to pt's dysphagia.  Pt also acknolwedges h/o stroke - per chart review, cerebellar cva documented.     Assessment / Plan / Recommendation Clinical Impression  Pt has multiple risk factors present for multifactorial dysphagia including known h/o esophageal dysphagia including esophageal dysmotility and  esophageal diverticuli previously diagnosed, cerebellar CVA, kyphosis, difficulty with managing secretions, and COPD impacting coordiation of swallowing and breathing.  Intermittent throat clearing noted without po intake, indicating difficulties managing secretions.   Overt weak cough x1 with cracker bolus noted - of approx 9 swallows.  SLP can not rule out OVERT silent aspiration given known hx of dysphagia and current status.     Options to pursue clear liquid po with known aspiration risk and assess tolerance clinically reviewed with pt.  Another option is to complete instrumental swallow eval  (MBS to rule out OVERT silent aspiration and instrumentally eval pharyngeal swallow and/or esophagram to re-eval esophageal swallow ability given medical change).  CN nerve exam unremarkable but note pt with sensory deficits in pharynx during esophagram in 2012 and has h/o cerebellar cva.   Suspect pt is at high SILENT asp risk given chronicity of dysphagia.  Pt expressed desire to pursue po with aspiration/reflux precautions in place.  Educated pt to SLP role of maximizing airway protection and to provide tips to decrease amount pt may aspriate, but not eliminate aspiration.      Aspiration Risk  Severe    Diet Recommendation NPO;Thin liquid (npo vs thin liquids with KNOWN asp risk)   Liquid Administration via: Cup Medication Administration: Whole meds with puree Supervision: Full supervision/cueing for compensatory strategies Compensations: Small sips/bites;Slow rate (allow rest breaks if short of breath or coughing) Postural Changes and/or Swallow Maneuvers: Seated upright 90 degrees;Upright 30-60 min after meal    Other  Recommendations Oral  Care Recommendations: Oral care BID Other Recommendations: Have oral suction available   Follow Up Recommendations       Frequency and Duration min 1 x/week  1 week   Pertinent Vitals/Pain Afebrile, decreased- coughing on secretions    SLP Swallow  Goals Patient will utilize recommended strategies during swallow to increase swallowing safety with: Minimal cueing Goal #3: Pt, MD will determine if desire for pt to have po diet with known aspiration risks and strategies to maximize airway protection.     Swallow Study Prior Functional Status  Lives With: Alone Available Help at Discharge: Personal care attendant (for 4 hours in AM and 3 hours in PM.)    General Date of Onset: 08/11/12 HPI: 78 yo male adm to [facility] 08/08/12 with shortness of breath - pt coughing on secretions and had fever.  Pt with hospital admission for CAP 04/02/12.  On 10/26 pt was given Ativan due to agitation and was noted be be choking on secretions - became unresponsive - pulseless - cardiac arrest.  Pt required intubation 10/26 extubated 10/29.  Clinical swallow evaluation was ordered on 08/11/12.  Pt also has h/o COPD.  CXR 10/29 indicated worsening left patchy airspace disease and slight improved right upper lobe aeration.  Pt has problems clearing secretions currently but he does not have awareness to this issue.  Pt reports weight loss of 45 pounds over the last few years with poor appetite.  Esophagrams have been ordered due to pt's dysphagia.  Pt also acknolwedges h/o stroke - per chart review, cerebellar cva documented.   Type of Study: Bedside swallow evaluation Previous Swallow Assessment: esophagram:   2/13 two large right sided mid-esophageal diverticuli just below carina, esophageal dysmotility, no hiatal hernia or reflux  06/03/11 possible oropharyngeal dysphagia or mental/cognitive/attentional disorder, tablet lodged in pharynx for several minutes despite liquid swallows WITHOUT pt awareness Diet Prior to this Study: NPO Temperature Spikes Noted: No Respiratory Status: Supplemental O2 delivered via (comment) History of Recent Intubation: Yes Length of Intubations (days): 4 days Date extubated: 08/11/12 Behavior/Cognition: Alert;Cooperative;Pleasant mood Oral  Cavity - Dentition: Dentures, bottom;Adequate natural dentition;Missing dentition Self-Feeding Abilities: Able to feed self Patient Positioning: Upright in chair Baseline Vocal Quality: Wet;Hoarse;Low vocal intensity Volitional Cough: Weak Volitional Swallow: Able to elicit    Oral/Motor/Sensory Function Overall Oral Motor/Sensory Function: Appears within functional limits for tasks assessed   Ice Chips Ice chips: Impaired Pharyngeal Phase Impairments: Throat Clearing - Delayed;Suspected delayed Swallow   Thin Liquid Thin Liquid: Impaired Presentation: Cup;Self Fed Pharyngeal  Phase Impairments: Suspected delayed Swallow;Multiple swallows;Throat Clearing - Delayed    Nectar Thick Nectar Thick Liquid: Not tested   Honey Thick Honey Thick Liquid: Not tested   Puree Presentation: Self Fed;Spoon Other Comments: four ounces of applesauce consumed without clinical indicators of aspiration or penetration   Solid   GO    Solid: Impaired Presentation: Self Fed Oral Phase Impairments: Impaired anterior to posterior transit Oral Phase Functional Implications: Oral residue;Oral holding Pharyngeal Phase Impairments: Cough - Delayed;Suspected delayed Swallow;Multiple swallows       Chales Abrahams 08/11/2012,4:43 PM

## 2012-08-11 NOTE — Progress Notes (Signed)
Patient is currently active with [facility] Care Management for chronic disease management services.  Patient has been engaged by  [employer].  Our community based plan of care has focused on disease management of CHF and HTN.  Patient will receive a post discharge transition of care call and will be evaluated for monthly home visits for assessments and disease process education. Of note, [facility] Care Management services does not replace or interfere with any services that are arranged by inpatient case management or social work.  For additional questions or referrals please contact [name] BSN RN [facility] Liaison at [phone].

## 2012-08-11 NOTE — Progress Notes (Signed)
Pt stated he takes medication for sleep at home. His pharmacy is [facility] [phone]. He does have a prescription for Temazepam (Restoril) 30mg .   Meredith Pel, Dr. Darrick Penna gave order for 7.5mg .

## 2012-08-11 NOTE — Evaluation (Signed)
Occupational Therapy Evaluation Patient Details Name: [name] MRN: [id number] DOB: [birth year] Today's Date: 08/11/2012 Time: 4098-1191 OT Time Calculation (min): 30 min  OT Assessment / Plan / Recommendation Clinical Impression  This 78 year old man was admitted with sob, cough and wheezing.  He has a h/o htn, copd, chronic back pain.  At baseline, he used RW and hired aides for a split shift of 4 and 3 hours a day.  He is appropriate for skilled OT to increase his independence with adls with min A level goals in acute.      OT Assessment  Patient needs continued OT Services    Follow Up Recommendations  Inpatient Rehab    Barriers to Discharge      Equipment Recommendations  None recommended by PT    Recommendations for Other Services    Frequency  Min 2X/week    Precautions / Restrictions Precautions Precautions: Fall Restrictions Weight Bearing Restrictions: No   Pertinent Vitals/Pain No c/o pain    ADL  Eating/Feeding: NPO (except ice chips) Grooming: Simulated;Set up Where Assessed - Grooming: Supported sitting Upper Body Bathing: Simulated;Set up Where Assessed - Upper Body Bathing: Supported sitting Lower Body Bathing: Simulated;+2 Total assistance Lower Body Bathing: Patient Percentage: 50% Where Assessed - Lower Body Bathing: Supported sit to stand Upper Body Dressing: Simulated;Minimal assistance (lines) Where Assessed - Upper Body Dressing: Supported sitting Lower Body Dressing: Simulated;+2 Total assistance Lower Body Dressing: Patient Percentage: 30% Where Assessed - Lower Body Dressing: Supported sit to Pharmacist, hospital: Chief of Staff: Patient Percentage: 50% Statistician Method: Surveyor, minerals:  (bed to chair) Toileting - Architect and Hygiene: Simulated Where Assessed - Engineer, mining and Hygiene: Sit to stand from 3-in-1 or toilet Tub/Shower Transfer:  +2 Total assistance Tub/Shower Transfer: Patient Percentage: 50% Transfers/Ambulation Related to ADLs: stand pivot transfer only ADL Comments: Pt just extubated today.  Had intermittent help with adls    OT Diagnosis: Generalized weakness  OT Problem List: Decreased strength;Decreased activity tolerance;Impaired balance (sitting and/or standing);Decreased knowledge of use of DME or AE;Cardiopulmonary status limiting activity OT Treatment Interventions: Self-care/ADL training;DME and/or AE instruction;Patient/family education;Balance training;Therapeutic activities   OT Goals Acute Rehab OT Goals OT Goal Formulation: With patient Time For Goal Achievement: 08/25/12 Potential to Achieve Goals: Good ADL Goals Pt Will Perform Grooming: with supervision;Standing at sink ADL Goal: Grooming - Progress: Goal set today Pt Will Perform Lower Body Bathing: with min assist;Sit to stand from chair;with adaptive equipment ADL Goal: Lower Body Bathing - Progress: Goal set today Pt Will Perform Lower Body Dressing: with min assist;Sit to stand from chair;with adaptive equipment ADL Goal: Lower Body Dressing - Progress: Goal set today Pt Will Transfer to Toilet: with min assist;Ambulation;3-in-1 ADL Goal: Toilet Transfer - Progress: Goal set today Pt Will Perform Toileting - Hygiene: with set-up;Sit to stand from 3-in-1/toilet ADL Goal: Toileting - Hygiene - Progress: Goal set today Miscellaneous OT Goals Miscellaneous OT Goal #1: pt will verbalize 3 energy conservation strategies OT Goal: Miscellaneous Goal #1 - Progress: Goal set today  Visit Information  Last OT Received On: 08/11/12 Assistance Needed: +2    Subjective Data  Subjective: I had aides 7x week for 4 hours then 3 hours Patient Stated Goal: Get legs stronger   Prior Functioning     Home Living Lives With: Alone Available Help at Discharge: Personal care attendant (for 4 hours in AM and 3 hours in PM.) Home Adaptive Equipment:  Walker - rolling Additional Comments: has aides 7x week, 7 hours split Prior Function Level of Independence: Needs assistance Needs Assistance: Light Housekeeping Comments: ambulated with RW Communication Communication: No difficulties         Vision/Perception     Cognition  Overall Cognitive Status: Appears within functional limits for tasks assessed/performed Arousal/Alertness: Awake/alert Orientation Level: Appears intact for tasks assessed Behavior During Session: [facility] for tasks performed    Extremity/Trunk Assessment Right Upper Extremity Assessment RUE ROM/Strength/Tone: Within functional levels Left Upper Extremity Assessment LUE ROM/Strength/Tone: Within functional levels Trunk Assessment Trunk Assessment: Kyphotic     Mobility Bed Mobility Bed Mobility: Supine to Sit;Sitting - Scoot to Edge of Bed Supine to Sit: 1: +2 Total assist Supine to Sit: Patient Percentage: 50% Details for Bed Mobility Assistance: pt took extra time to scoot to edge. verbal cues/tactile cues Transfers Sit to Stand: 1: +2 Total assist Sit to Stand: Patient Percentage: 50% Stand to Sit: 4: Min assist;To chair/3-in-1;With upper extremity assist Details for Transfer Assistance: pt takes extra time for taking steps to recliner. VC for hand use.     Shoulder Instructions     Exercise     Balance Static Sitting Balance Static Sitting - Balance Support: Bilateral upper extremity supported Static Sitting - Level of Assistance: 5: Stand by assistance Static Sitting - Comment/# of Minutes: once at edge able to sit with no assistance   End of Session OT - End of Session Activity Tolerance: Patient limited by fatigue Patient left: in chair;with call bell/phone within reach  GO     [facility] 08/11/2012, 3:48 PM Marica Otter, OTR/L [phone] 08/11/2012

## 2012-08-11 NOTE — Evaluation (Signed)
Physical Therapy Evaluation Patient Details Name: [name] MRN: [id number] DOB: Dec 31, 1921 Today's Date: 08/11/2012 Time: 1191-4782 PT Time Calculation (min): 26 min  PT Assessment / Plan / Recommendation Clinical Impression  Pt. was admitted on 10/26 for SOB, cough and temp. Pt. had cardiac arrest, VDRF, extubated 10/29. Pt. was able to get up to recliner same day. Pt. has caregivers but not 24/7. Pt. may require SNF to improve to rweturn home unless pt. has 24/7 caregivers.    PT Assessment  Patient needs continued PT services    Follow Up Recommendations  Post acute inpatient    Does the patient have the potential to tolerate intense rehabilitation   Yes, Recommend IP Rehab Screening  Barriers to Discharge Decreased caregiver support      Equipment Recommendations  None recommended by PT    Recommendations for Other Services Rehab consult   Frequency Min 3X/week    Precautions / Restrictions Precautions Precautions: Fall Restrictions Weight Bearing Restrictions: No   Pertinent Vitals/Pain On 2 l  sats >94 %, HR 97      Mobility  Bed Mobility Bed Mobility: Supine to Sit;Sitting - Scoot to Edge of Bed Supine to Sit: 1: +2 Total assist Supine to Sit: Patient Percentage: 50% Sitting - Scoot to Edge of Bed: 3: Mod assist Details for Bed Mobility Assistance: pt took extra time to scoot to edge. verbal cues/tactile cues Transfers Transfers: Sit to Stand;Stand to Sit;Stand Pivot Transfers Sit to Stand: 1: +2 Total assist;From bed;With upper extremity assist Sit to Stand: Patient Percentage: 50% Stand to Sit: 4: Min assist;To chair/3-in-1;With upper extremity assist Stand Pivot Transfers: 1: +2 Total assist Stand Pivot Transfers: Patient Percentage: 50% Details for Transfer Assistance: pt takes extra time for taking steps to recliner. VC for hand use. Ambulation/Gait Ambulation/Gait Assistance: Not tested (comment)    Shoulder Instructions     Exercises      PT Diagnosis: Difficulty walking;Generalized weakness  PT Problem List: Decreased strength;Decreased activity tolerance;Decreased mobility;Decreased knowledge of use of DME;Decreased safety awareness;Decreased knowledge of precautions PT Treatment Interventions: DME instruction;Gait training;Functional mobility training;Therapeutic exercise;Therapeutic activities;Patient/family education   PT Goals Acute Rehab PT Goals PT Goal Formulation: With patient Time For Goal Achievement: 08/25/12 Pt will go Supine/Side to Sit: with supervision PT Goal: Supine/Side to Sit - Progress: Goal set today Pt will go Sit to Supine/Side: with supervision PT Goal: Sit to Supine/Side - Progress: Goal set today Pt will go Sit to Stand: with supervision PT Goal: Sit to Stand - Progress: Goal set today Pt will go Stand to Sit: with supervision PT Goal: Stand to Sit - Progress: Goal set today Pt will Ambulate: 51 - 150 feet;with supervision;with rolling walker PT Goal: Ambulate - Progress: Goal set today  Visit Information  Last PT Received On: 08/11/12 Assistance Needed: +2    Subjective Data  Subjective: I will have pain when I get up. My back Patient Stated Goal: wants to return home if possible   Prior Functioning  Home Living Lives With: Alone Available Help at Discharge: Personal care attendant (for 4 hours in AM and 3 hours in PM.) Home Adaptive Equipment: Walker - rolling Additional Comments: has aides 7x week, 7 hours split Prior Function Level of Independence: Needs assistance Needs Assistance: Light Housekeeping Comments: ambulated with RW Communication Communication: No difficulties    Cognition  Overall Cognitive Status: Appears within functional limits for tasks assessed/performed Arousal/Alertness: Awake/alert Orientation Level: Appears intact for tasks assessed Behavior During Session: [facility] for tasks  performed    Extremity/Trunk Assessment Right Lower Extremity Assessment RLE  ROM/Strength/Tone: WFL for tasks assessed RLE Sensation: WFL - Light Touch Left Lower Extremity Assessment LLE ROM/Strength/Tone: WFL for tasks assessed LLE Sensation: WFL - Light Touch Trunk Assessment Trunk Assessment: Kyphotic   Balance Static Sitting Balance Static Sitting - Balance Support: Bilateral upper extremity supported Static Sitting - Level of Assistance: 5: Stand by assistance Static Sitting - Comment/# of Minutes: once at edge able to sit with no assistance  End of Session PT - End of Session Activity Tolerance: Patient limited by fatigue;Treatment limited secondary to medical complications (Comment) (dyspnea) Patient left: in chair;with call bell/phone within reach Nurse Communication: Mobility status  GP     Rada Hay 08/11/2012, 3:01 PM  [phone]

## 2012-08-12 DIAGNOSIS — D649 Anemia, unspecified: Secondary | ICD-10-CM

## 2012-08-12 LAB — COMPREHENSIVE METABOLIC PANEL
CO2: 32 mEq/L (ref 19–32)
Calcium: 8.3 mg/dL — ABNORMAL LOW (ref 8.4–10.5)
Creatinine, Ser: 0.8 mg/dL (ref 0.50–1.35)
GFR calc Af Amer: 90 mL/min — ABNORMAL LOW (ref >90)
GFR calc non Af Amer: 77 mL/min — ABNORMAL LOW (ref >90)
Glucose, Bld: 89 mg/dL (ref 70–99)

## 2012-08-12 LAB — CBC
HCT: 25.2 % — ABNORMAL LOW (ref 39.0–52.0)
Hemoglobin: 8.5 g/dL — ABNORMAL LOW (ref 13.0–17.0)
MCH: 36.2 pg — ABNORMAL HIGH (ref 26.0–34.0)
MCV: 107.2 fL — ABNORMAL HIGH (ref 78.0–100.0)
RBC: 2.35 MIL/uL — ABNORMAL LOW (ref 4.22–5.81)

## 2012-08-12 MED ORDER — ENSURE COMPLETE PO LIQD
237.0000 mL | Freq: Three times a day (TID) | ORAL | Status: DC
  Administered 2012-08-12 – 2012-08-14 (×4): 237 mL via ORAL

## 2012-08-12 MED ORDER — BOOST / RESOURCE BREEZE PO LIQD
1.0000 | Freq: Two times a day (BID) | ORAL | Status: DC
  Administered 2012-08-12 – 2012-08-14 (×4): 1 via ORAL

## 2012-08-12 NOTE — Progress Notes (Signed)
INITIAL ADULT NUTRITION ASSESSMENT Date: 08/12/2012   Time: 11:58 AM Reason for Assessment: Consult-calorie intake, dysphagia,, supplements   ASSESSMENT: Male 78 y.o.  Dx: Community acquired pneumonia, known mid esophagel Diverticulum, Asystolic arrest  Hx: baseline dysphagia Past Medical History  Diagnosis Date  . Atrial fibrillation with slow ventricular response     Symptoms of weakness  . Paroxysmal atrial fibrillation     Hx of, currently on amiodarone load  . Syncope     Probably related to sinus pause, finally conversion from atrial fibrillation to sinus rhythm  . COPD (chronic obstructive pulmonary disease)   . Hypertension   . Abnormal CXR 07/06/2007; 06/15/2010    Eleveted R HD 07/06/2007 > no change 09/92/2011  . Chronic back pain   . 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 Joint replacement     Related Meds:     . amiodarone  200 mg Oral Daily  . antiseptic oral rinse  15 mL Mouth Rinse QID  . arformoterol  15 mcg Nebulization BID  . aspirin  81 mg Per Tube Daily  . budesonide  0.25 mg Nebulization BID  . calcium-vitamin D  1 tablet Oral Daily  . chlorhexidine  15 mL Mouth Rinse BID  . cholecalciferol  1,000 Units Oral Daily  . enoxaparin (LOVENOX) injection  40 mg Subcutaneous Q24H  . mirtazapine  7.5 mg Oral QHS  . moxifloxacin  400 mg Oral Q2000  . pantoprazole sodium  40 mg Oral Q1200  . potassium chloride  40 mEq Oral BID  . sodium chloride  3 mL Intravenous Q12H  . Tamsulosin HCl  0.4 mg Oral Daily  . DISCONTD: Calcium + D3  1 tablet Oral Daily  . DISCONTD: dutasteride  0.5 mg Oral Daily    Ht: 5\' 9"  (175.3 cm)  Wt: 139 lb 12.4 oz (63.4 kg)  Ideal Wt: 73 kg % Ideal Wt: 87  Usual Wt:  Wt Readings from Last 10 Encounters:  08/12/12 139 lb 12.4 oz (63.4 kg)  05/08/12 140 lb (63.504 kg)  04/10/12 152 lb 5.4 oz (69.1 kg)  03/26/11 153 lb (69.4 kg)  06/15/10 162 lb (73.483  kg)  03/30/10 161 lb (73.029 kg)  12/12/09 164 lb (74.39 kg)  06/27/09 162 lb (73.483 kg)  04/04/09 169 lb (76.658 kg)  03/23/09 176 lb (79.833 kg)     % Usual Wt: 91% of weight 4 months ago, 79% of weight 3 years ago  Body mass index is 20.64 kg/(m^2).   Labs:  CMP     Component Value Date/Time   NA 142 08/12/2012 0343   K 3.9 08/12/2012 0343   CL 106 08/12/2012 0343   CO2 32 08/12/2012 0343   GLUCOSE 89 08/12/2012 0343   BUN 20 08/12/2012 0343   CREATININE 0.80 08/12/2012 0343   CALCIUM 8.3* 08/12/2012 0343   PROT 5.3* 08/12/2012 0343   ALBUMIN 2.6* 08/12/2012 0343   AST 22 08/12/2012 0343   ALT 35 08/12/2012 0343   ALKPHOS 85 08/12/2012 0343   BILITOT 0.7 08/12/2012 0343   GFRNONAA 77* 08/12/2012 0343   GFRAA 90* 08/12/2012 0343  vitamin B-12 903 WNL  I/O last 3 completed shifts: In: 2425 [I.V.:1600; ZOXWR:604; IV Piggyback:600] Out: 1526 [Urine:1525; Stool:1] Total I/O In: 387.5 [I.V.:150.5; Other:237] Out: -    Diet Order: NPO  Supplements/Tube Feeding:  none  IVF:    dextrose 5 % and 0.9%  NaCl Last Rate: 20 mL/hr (08/12/12 0841)    Estimated Nutritional Needs:   Kcal: 1600-1800 kcal Protein: 85-95 gm Fluid: 1.6-1.8L  Food/Nutrition Related Hx: Pt reports regular diet with good intake prior to admit although family reports that intake had been decreasing recently.  Weight loss of 9% over the past 4 months.  Weight loss of 81% over the past 3 years.    Pt with temporal wasting, obvious visual loss of muscle mass and body fat.  Pt meets criteria for severe malnutrition related to chronic illness.  Pt asking many questions.  Answered questions, provided pt with book on TF per his request.  Pt asked that we communicate with his daughter ([phone]) unable to reach her by phone at this time.  Pt has been thinking about the implications of his swallowing problem and what the best course of action is.  Spoke with SLP at length and RN.  Recommendations  for Full Liquid Diet with Ensure and Applesauce.  NUTRITION DIAGNOSIS: -Inadequate oral intake (NI-2.1).  Status: Ongoing  RELATED TO: dysphagia  AS EVIDENCE BY: current diet order and decreased intake  MONITORING/EVALUATION(Goals): Diet advancement, dysphagia, intake, labs, weight Goal:  Based on plan of care.   EDUCATION NEEDS: -Education needs addressed-discussed enteral feeding with pt at his request and book provided on home TF.  Discussed nutrition with oral diet as well.  INTERVENTION: Strict restrictions for all po as posted by SLP in room Recommend Full Liquid Diet with Applesauce  Ensure Complete tid Resource Breeze once daily Will Begin Calorie Count  Dietitian [phone]  DOCUMENTATION CODES Per approved criteria  -Severe malnutrition in the context of chronic illness    Derrell Lolling Anastasia Fiedler 08/12/2012, 11:58 AM

## 2012-08-12 NOTE — Progress Notes (Signed)
   Subjective:  Intubated; alert  Objective:  Filed Vitals:   08/12/12 0300 08/12/12 0400 08/12/12 0430 08/12/12 0500  BP: 110/73   117/67  Pulse: 73 84  100  Temp:   97.7 F (36.5 C)   TempSrc:      Resp: 18 22  26   Height:      Weight:      SpO2: 100% 100%  100%    Intake/Output from previous day:  Intake/Output Summary (Last 24 hours) at 08/12/12 0739 Last data filed at 08/12/12 0600  Gross per 24 hour  Intake   1350 ml  Output    801 ml  Net    549 ml    Physical Exam: Physical exam: Well-developed well-nourished in no acute distress; intubated Skin is warm and dry.  HEENT is normal.  Neck is supple.  Chest with rhonci Cardiovascular exam is irregular Abdominal exam nontender or distended. No masses palpated. Extremities show no edema. neuro grossly intact    Lab Results: Basic Metabolic Panel:  Basename 08/12/12 0343 08/11/12 1354  NA 142 141  K 3.9 4.3  CL 106 105  CO2 32 30  GLUCOSE 89 82  BUN 20 21  CREATININE 0.80 0.83  CALCIUM 8.3* 8.3*  MG -- --  PHOS -- --   CBC:  Basename 08/12/12 0343 08/11/12 0335  WBC 4.7 6.1  NEUTROABS -- --  HGB 8.5* 8.7*  HCT 25.2* 26.1*  MCV 107.2* 107.0*  PLT 254 265   Cardiac Enzymes: No results found for this basename: CKTOTAL:3,CKMB:3,CKMBINDEX:3,TROPONINI:3 in the last 72 hours   Assessment/Plan:  1 bradycardic arrest-possibly related to respiratory issues/vagal event. Telemetry reviewed and no recurrent bradycardia.  2 atrial fibrillation-patient has previously been felt not to be a Coumadin candidate because of recurrent falls. He remains in atrial fibrillation. Continue amiodarone. Continue ASA. FU Dr Shirlee Latch 2-4 weeks following DC. 3 elevated liver functions-resolved 4 pneumonia-continue antibiotics. 5 VDRF-vent wean per critical care medicine Patient can be transferred from cardiac standpoint. We will sign off; please call with questions. Olga Millers 08/12/2012, 7:39 AM

## 2012-08-12 NOTE — Progress Notes (Signed)
Speech Language Pathology Dysphagia Treatment Patient Details Name: [name] MRN: [id number] DOB: 02-24-1922 Today's Date: 08/12/2012 Time: 4098-1191 SLP Time Calculation (min): 37 min  Assessment / Plan / Recommendation Clinical Impression  SLP follow up after initial clinical swallow evaluation completed.  Reviewed chart and discussed with RN, note plans for pt to initiate a liquid diet to assess tolerance and adequacy of nutritional intake.  Pt has a complex multifactorial dysphagia including suspected pharyngeal deficits (per esophagram 2012 when barium tablet lodged in pharynx for several minutes without pt awareness), esophageal dysmotility, mid-esophagus diverticuli ( two right sided just below carina* ) per previous esophagrams.  SLP reviewed compensatory strategies to aid in maximal airway protection with this pt.  SLP does not suspect use of thickener would positively impact aspiration risk.    Rec full liquid diet (with known risks) and strict precautions to compensate for pharyngeal and esophageal issues.    As pt is considering long term feeding tube in indicated, SLP advised pt that long term feeding tube would not prevent his aspiration of secretions- to which he verbalized understanding.  Importance of oral care reviewed as well.    Per interview with pt and chart review, pt has a chronic dysphagia (dating back to 2012 at least) for which he has compensated for until recent medical event, however pt had has substantial weight loss per SLP conversation with RD.  Hopeful pt will return to baseline dysphagic level with medical improvement .  Pt observed today swallowing water, few bites of jello without clinical indicators of aspiration.    Advised pt to continue po throughout the day to maximize nutrition with decr risk.  Pt reports continuing to have difficulties with secretion management but this has improved compared to yesterday.  Much of time was spent reviewing previous  testing.  SLP to follow up this week for further education and diet tolerance.     Diet Recommendation  Initiate / Change Diet: Thin liquid (full liquids)    SLP Plan Continue with current plan of care   Pertinent Vitals/Pain Afebrile, decreased  Per RN, pt consumed Ensure drink, applesauce and jello without difficulties today.    Swallowing Goals  SLP Swallowing Goals Swallow Study Goal #2 - Progress: Progressing toward goal Swallow Study Goal #3 - Progress: Met  General Temperature Spikes Noted: No Respiratory Status: Supplemental O2 delivered via (comment) Behavior/Cognition: Alert;Cooperative;Pleasant mood Oral Cavity - Dentition: Dentures, bottom;Adequate natural dentition;Edentulous (uses adhesive) Patient Positioning: Upright in chair  Oral Cavity - Oral Hygiene     Dysphagia Treatment Treatment focused on: Patient/family/caregiver education Family/Caregiver Educated: representative from home care company present during slp discussion Treatment Methods/Modalities: Skilled observation Patient observed directly with PO's: Yes Type of PO's observed: Thin liquids;Nectar-thick liquids (icecream, jello) Feeding: Able to feed self Liquids provided via: Cup Pharyngeal Phase Signs & Symptoms:  (pt taking small bites/sips without cues) Type of cueing: Verbal (reviewing of aspiration/esoph precautions) Amount of cueing: Minimal   GO     Donavan Burnet, MS [facility] SLP [phone]

## 2012-08-12 NOTE — Progress Notes (Signed)
Rehab Admissions Coordinator Note:  Patient was screened by Trish Mage for appropriateness for an Inpatient Acute Rehab Consult.  At this time, we are recommending Inpatient Rehab consult.  Trish Mage 08/12/2012, 2:38 PM  I can be reached at [phone].

## 2012-08-12 NOTE — Clinical Social Work Note (Signed)
CSW revisited idea of SNF and/or [facility]. Pt is open to [facility] and prefers that over SNF. He is open to SNF as a third choice and agrees to CSW begin a bed search as a back up plan just in case. CSW will fax out for possible SNF.   Doreen Salvage, LCSW ICU/Stepdown Clinical Social Worker [facility] Cell [phone] Hours 8am-1200pm M-F

## 2012-08-12 NOTE — Progress Notes (Signed)
Name: MONTREAL STEIDLE MRN: [id number] DOB: 1922-03-31    LOS: 4  Referring Provider:  Rodolph Bong, MD Reason for Referral:  Cardiac Arrest PCP- [facility], [facility]  PULMONARY / CRITICAL CARE MEDICINE  HPI:  78 yo M with afib on amio (no coumadin for fall risk) , HTN and recent admission for CAP in 03/2012 who was admitted 10/26 with worsening shortness of breath, cough productive of clear sputum and wheezing.  He was noted to have a temperature to 100.69F.  Chest x-ray showed bibasilar opacities.  He was reluctant to be admitted but agreed to come into the hospital overnight.  He was started on ceftriaxone and azithromycin.    10/26 evening he was noted to be agitated.  He received 0.5 mg ativan. He later used the bedside and was noted to have difficulty breathing and was chocking on his secretions.  He then became unresponsive and was found to be pulseless.  Chest compression started and he was found to be in asystole.  He received 2 rounds of epi and one amp of bicarb with ROSC- downtime 9 mins by code sheet.   Events Since Admission: 10/26: PEA arrest with 9 minutes of CPR, transfer to ICU 10/28: Extubated  Current Status: Stable  Vital Signs: Temp:  [97.7 F (36.5 C)-98.7 F (37.1 C)] 98.3 F (36.8 C) (10/30 0800) Pulse Rate:  [73-100] 88  (10/30 0700) Resp:  [18-28] 26  (10/30 0700) BP: (90-132)/(58-77) 120/58 mmHg (10/30 0700) SpO2:  [90 %-100 %] 98 % (10/30 0928) Weight:  [63.4 kg (139 lb 12.4 oz)] 63.4 kg (139 lb 12.4 oz) (10/30 0000) 2 liters   Physical Examination: General:  Awake, alert.  Neuro:  No focal def, oriented X 3 HEENT: Sclera clear, mucus membranes moist Neck:  supple Cardiovascular:  Irreg, irreg, + S3 Lungs: Abdomen:  Soft, NT, ND Musculoskeletal:  WNL Skin:  No rash  Principal Problem:  *Community acquired pneumonia Active Problems:  HYPERTENSION, BENIGN  Atrial fibrillation  DIASTOLIC HEART FAILURE, CHRONIC  DYSPNEA  COPD (chronic  obstructive pulmonary disease)  Anemia  Cardiac arrest   ASSESSMENT AND PLAN  PULMONARY  Lab 08/08/12 2347 08/08/12 2246 08/08/12 1014  PHART 7.431 7.369 7.430  PCO2ART 43.0 48.4* 44.0  PO2ART 168.0* 194.0* 74.7*  HCO3 28.1* 27.3* 28.6*  O2SAT 98.8 97.9 96.1   Ventilator Settings:   CXR:  Low vol c/w atx s/p extubation. Improved aeration on the right  ETT:  10/26 >>>10/29  A:  Acute on chronic shortness of breath, ?respiratory arrest. PNA Chronic aspiration with known Zenker's Diverticulum CXR demonstrating R>L airspace disease. Per Speech he is a aspiration risk  P:   -See infection section -follow PCXR -Wean O2 -respiratory hygiene -PT/OT -Incentive spiro -history of COPD, likely chronic bronchitis by history, continue pulmicort and brovana    CARDIOVASCULAR  Lab 08/09/12 0520 08/08/12 2240  TROPONINI <0.30 <0.30  LATICACIDVEN -- --  PROBNP -- --   ECG:  10/26 Afib, rate 123; (HR 80 10/29 AM) Lines: 3 PIVs  A: Asystolic arrest, etiology unclear - ? Sick sinus, also consider hypoxia, aspiration, vagal. Chronic afib Seen by EP. They feel that this was precipitated by coughing, thick sputum and resultant brady event but do note that he has had syncopal event in past.   P:  -will need cardiology follow up post discharge -PO amiodarone -Telemetry monitoring  RENAL  Lab 08/12/12 0343 08/11/12 1354 08/11/12 0335 08/10/12 0351 08/09/12 0520 08/08/12 2240 08/08/12 1210  NA 142  141 142 143 142 -- --  K 3.9 4.3 -- -- -- -- --  CL 106 105 103 108 105 -- --  CO2 32 30 32 28 29 -- --  BUN 20 21 21 20 18  -- --  CREATININE 0.80 0.83 0.94 0.89 0.86 -- --  CALCIUM 8.3* 8.3* 8.3* 8.2* 8.4 -- --  MG -- -- -- -- -- 1.8 1.9  PHOS -- -- -- -- -- -- --   Intake/Output      10/29 0701 - 10/30 0700 10/30 0701 - 10/31 0700   I.V. (mL/kg) 1000 (15.8) 50 (0.8)   Other     IV Piggyback 400    Total Intake(mL/kg) 1400 (22.1) 50 (0.8)   Urine (mL/kg/hr) 800 (0.5)    Stool 1     Total Output 801    Net +599 +50         Foley:  10/26>>10/30  A:   No current issues P:  Follow BMP, mag  GASTROINTESTINAL  Lab 08/12/12 0343 08/11/12 0335 08/09/12 0520 08/08/12 2240 08/08/12 0740  AST 22 34 150* 186* 20  ALT 35 52 105* 123* 13  ALKPHOS 85 89 101 111 122*  BILITOT 0.7 1.0 1.2 1.0 1.4*  PROT 5.3* 5.4* 5.6* 5.9* 6.9  ALBUMIN 2.6* 2.7* 2.9* 3.0* 3.6    A:  Transaminitis after cardiac arrest - resolved Protien malnutrition Aspiration risk P:   -He and I had a lengthy conversation regarding his aspiration; he is in favor of a gastrostomy tube if needed for nutrition, but would prefer to start with oral supplements (Ensure, etc) first while working with Speech to improve his swallowing function; if he is unable to keep up with his caloric needs or if his swallowing does not improve then will initiate tube feedings   HEMATOLOGIC  Lab 08/12/12 0343 08/11/12 0335 08/10/12 0351 08/09/12 0520 08/08/12 2240 08/08/12 0740  HGB 8.5* 8.7* 9.0* 9.3* 9.9* --  HCT 25.2* 26.1* 26.3* 27.0* 29.3* --  PLT 254 265 279 292 305 --  INR -- -- -- -- 1.29 1.08  APTT -- -- -- -- 42* --   A:  Macrocytic anemia B12 WNL P:  No role for transfusion currently Trend CBC.   INFECTIOUS  Lab 08/12/12 0343 08/11/12 0335 08/10/12 0351 08/09/12 0520 08/08/12 2240  WBC 4.7 6.1 6.9 10.3 11.0*  PROCALCITON -- -- -- -- --   Cultures: Blood 10/26>>>NGTD Sputum 10/26: rare GNR, rare GPR>>>OPF MRSA nasal 10/26: negative  U strep and legionella antigens: negative   Antibiotics: Ceftriaxone 10/26>>>10/29 Azithromycin 10/26>>>10/29 avelox 10/29>>>  A:  Possible CAP  P:   - Continue CAP coverage  - F/u cultures   NEUROLOGIC  A:  Awake, physical deconditioning  after cardiac arrest      P:   -continue home meds - oxycodone, mirtazapine, doxepin, flomax, avodart, ASA , melatonin -Hold restoril (recently started) -PT/OT following  BEST PRACTICE / DISPOSITION Level of Care:   Floor > ICU-->SDU status  Primary Service:  PCCM Consultants:  none Code Status:  Full Diet:  NPO DVT Px:  lovenox GI Px:  protonix Skin Integrity:  No issues Social / Family: updated  Yolonda Kida PCCM Pager: [phone] Cell: [phone] If no response, call [phone]

## 2012-08-12 NOTE — Clinical Social Work Placement (Signed)
Clinical Social Work Department CLINICAL SOCIAL WORK PLACEMENT NOTE 08/12/2012  Patient:  [name], [name]  Account Number:  [id number] Admit date:  08/08/2012  Clinical Social Worker:  Jodelle Red  Date/time:  08/12/2012 12:05 PM  Clinical Social Work is seeking post-discharge placement for this patient at the following level of care:   SKILLED NURSING   (*CSW will update this form in Epic as items are completed)   08/12/2012  Patient/family provided with [facility] Department of Clinical Social Work's list of facilities offering this level of care within the geographic area requested by the patient (or if unable, by the patient's family).  08/12/2012  Patient/family informed of their freedom to choose among providers that offer the needed level of care, that participate in Medicare, Medicaid or managed care program needed by the patient, have an available bed and are willing to accept the patient.  08/12/2012  Patient/family informed of [facility]' ownership interest in [facility], as well as of the fact that they are under no obligation to receive care at this facility.  PASARR submitted to [employer] on  PASARR number received from [employer] on 07/04/2009  FL2 transmitted to all facilities in geographic area requested by pt/family on  08/12/2012 FL2 transmitted to all facilities within larger geographic area on   Patient informed that his/her managed care company has contracts with or will negotiate with  certain facilities, including the following:     Patient/family informed of bed offers received:   Patient chooses bed at  Physician recommends and patient chooses bed at    Patient to be transferred to  on   Patient to be transferred to facility by   The following physician request were entered in Epic:   Additional Comments:  Doreen Salvage, LCSW ICU/Stepdown Clinical Social Worker [facility] Cell [phone] Hours 8am-1200pm M-F

## 2012-08-13 DIAGNOSIS — R5381 Other malaise: Secondary | ICD-10-CM

## 2012-08-13 LAB — BASIC METABOLIC PANEL
CO2: 29 mEq/L (ref 19–32)
GFR calc non Af Amer: 77 mL/min — ABNORMAL LOW (ref >90)
Glucose, Bld: 103 mg/dL — ABNORMAL HIGH (ref 70–99)
Potassium: 4.4 mEq/L (ref 3.5–5.1)
Sodium: 143 mEq/L (ref 135–145)

## 2012-08-13 LAB — CBC
Hemoglobin: 9.3 g/dL — ABNORMAL LOW (ref 13.0–17.0)
MCHC: 33.1 g/dL (ref 30.0–36.0)
Platelets: 307 10*3/uL (ref 150–400)
RBC: 2.62 MIL/uL — ABNORMAL LOW (ref 4.22–5.81)

## 2012-08-13 LAB — MAGNESIUM: Magnesium: 1.8 mg/dL (ref 1.5–2.5)

## 2012-08-13 NOTE — Progress Notes (Signed)
Name: [name] MRN: [id number] DOB: Oct 14, 1922    LOS: 5  Referring Provider:  Rodolph Bong, MD Reason for Referral:  Cardiac Arrest PCP- stoneking, eagle  PULMONARY / CRITICAL CARE MEDICINE  HPI:  78 yo M with afib on amio (no coumadin for fall risk) , HTN and recent admission for CAP in 03/2012 who was admitted 10/26 with worsening shortness of breath, cough productive of clear sputum and wheezing.  He was noted to have a temperature to 100.20F.  Chest x-ray showed bibasilar opacities.  He was reluctant to be admitted but agreed to come into the hospital overnight.  He was started on ceftriaxone and azithromycin.    10/26 evening he was noted to be agitated.  He received 0.5 mg ativan. He later used the bedside and was noted to have difficulty breathing and was chocking on his secretions.  He then became unresponsive and was found to be pulseless.  Chest compression started and he was found to be in asystole.  He received 2 rounds of epi and one amp of bicarb with ROSC- downtime 9 mins by code sheet.   Events Since Admission: 10/26: PEA arrest with 9 minutes of CPR, transfer to ICU 10/28: Extubated  Current Status: Stable  Vital Signs: Temp:  [98.5 F (36.9 C)-99.1 F (37.3 C)] 98.6 F (37 C) (10/31 0800) Pulse Rate:  [92-100] 92  (10/31 0400) Resp:  [25-31] 28  (10/31 0400) BP: (101-126)/(67-79) 126/79 mmHg (10/31 0400) SpO2:  [96 %-100 %] 96 % (10/31 0400) Room air   Physical Examination: General:  Awake, alert.  Neuro:  No focal def, oriented X 3 HEENT: Sclera clear, mucus membranes moist Neck:  supple Cardiovascular:  Irreg, irreg, + S3 Lungs: scattered rhonchi, no accessory muscle use  Abdomen:  Soft, NT, ND Musculoskeletal:  WNL Skin:  No rash  Principal Problem:  *Community acquired pneumonia Active Problems:  HYPERTENSION, BENIGN  Atrial fibrillation  DIASTOLIC HEART FAILURE, CHRONIC  DYSPNEA  COPD (chronic obstructive pulmonary disease)  Anemia  Cardiac arrest   ASSESSMENT AND PLAN  PULMONARY  Lab 08/08/12 2347 08/08/12 2246 08/08/12 1014  PHART 7.431 7.369 7.430  PCO2ART 43.0 48.4* 44.0  PO2ART 168.0* 194.0* 74.7*  HCO3 28.1* 27.3* 28.6*  O2SAT 98.8 97.9 96.1   Ventilator Settings:   CXR:  Low vol c/w atx s/p extubation. Improved aeration on the right  ETT:  10/26 >>>10/29  A:  Acute on chronic shortness of breath, ?respiratory arrest. PNA Chronic aspiration with known Zenker's Diverticulum CXR demonstrating R>L airspace disease. Per Speech he is a aspiration risk  P:   -See infection section -Wean O2 -respiratory hygiene -PT/OT -Incentive spiro -history of COPD, likely chronic bronchitis by history, continue pulmicort and brovana    CARDIOVASCULAR  Lab 08/09/12 0520 08/08/12 2240  TROPONINI <0.30 <0.30  LATICACIDVEN -- --  PROBNP -- --   ECG:  10/26 Afib, rate 123; (HR 80 10/29 AM) Lines: 3 PIVs  A: Asystolic arrest, etiology unclear - ? Sick sinus, also consider hypoxia, aspiration, vagal. Chronic afib Seen by EP. They feel that this was precipitated by coughing, thick sputum and resultant brady event but do note that he has had syncopal event in past.   P:  -will need cardiology follow up post discharge -PO amiodarone -Telemetry monitoring  RENAL  Lab 08/13/12 0353 08/12/12 0343 08/11/12 1354 08/11/12 0335 08/10/12 0351 08/08/12 2240 08/08/12 1210  NA 143 142 141 142 143 -- --  K 4.4 3.9 -- -- -- -- --  CL 107 106 105 103 108 -- --  CO2 29 32 30 32 28 -- --  BUN 21 20 21 21 20  -- --  CREATININE 0.82 0.80 0.83 0.94 0.89 -- --  CALCIUM 8.7 8.3* 8.3* 8.3* 8.2* -- --  MG 1.8 -- -- -- -- 1.8 1.9  PHOS -- -- -- -- -- -- --   Intake/Output      10/30 0701 - 10/31 0700 10/31 0701 - 11/01 0700   P.O. 200    I.V. (mL/kg) 490.5 (7.7)    Other 951    IV Piggyback     Total Intake(mL/kg) 1641.5 (25.9)    Urine (mL/kg/hr) 1000 (0.7)    Stool 0    Total Output 1000    Net +641.5         Stool  Occurrence      Foley:  10/26>>10/30  A:   No current issues P:  Follow BMP, mag  GASTROINTESTINAL  Lab 08/12/12 0343 08/11/12 0335 08/09/12 0520 08/08/12 2240 08/08/12 0740  AST 22 34 150* 186* 20  ALT 35 52 105* 123* 13  ALKPHOS 85 89 101 111 122*  BILITOT 0.7 1.0 1.2 1.0 1.4*  PROT 5.3* 5.4* 5.6* 5.9* 6.9  ALBUMIN 2.6* 2.7* 2.9* 3.0* 3.6    A:   Transaminitis after cardiac arrest - resolved Protein  malnutrition Aspiration risk P:   -He and I had a lengthy conversation regarding his aspiration; he is in favor of a gastrostomy tube if needed for nutrition, but would prefer to start with oral supplements (Ensure, etc) first while working with Speech to improve his swallowing function; if he is unable to keep up with his caloric needs or if his swallowing does not improve then will initiate tube feedings   HEMATOLOGIC  Lab 08/13/12 0353 08/12/12 0343 08/11/12 0335 08/10/12 0351 08/09/12 0520 08/08/12 2240 08/08/12 0740  HGB 9.3* 8.5* 8.7* 9.0* 9.3* -- --  HCT 28.1* 25.2* 26.1* 26.3* 27.0* -- --  PLT 307 254 265 279 292 -- --  INR -- -- -- -- -- 1.29 1.08  APTT -- -- -- -- -- 42* --   A:  Macrocytic anemia B12 WNL P:  No role for transfusion currently Trend CBC.   INFECTIOUS  Lab 08/13/12 0353 08/12/12 0343 08/11/12 0335 08/10/12 0351 08/09/12 0520  WBC 5.6 4.7 6.1 6.9 10.3  PROCALCITON -- -- -- -- --   Cultures: Blood 10/26>>>NGTD Sputum 10/26: normal flora  MRSA nasal 10/26: negative  U strep and legionella antigens: negative   Antibiotics: Ceftriaxone 10/26>>>10/29 Azithromycin 10/26>>>10/29 avelox 10/29>>>  A:  Possible CAP  P:   - Continue CAP coverage (will complete 10d total rx) - F/u cultures   NEUROLOGIC  A:  Awake, physical deconditioning  after cardiac arrest      P:   -continue home meds - oxycodone, mirtazapine, doxepin, flomax, avodart, ASA , melatonin -Hold restoril (recently started) -PT/OT following  BEST PRACTICE /  DISPOSITION Level of Care:  Floor > ICU-->SDU status-->ready for rehab when bed available  Primary Service:  PCCM Consultants:  none Code Status:  Full Diet:  NPO DVT Px:  lovenox GI Px:  protonix Skin Integrity:  No issues Social / Family: updated  BABCOCK,PETE  Attending:  I have seen and examined the patient with nurse practitioner/resident and agree with the note above.   Improving from pneumonia standpoint. Tolerating liquid diet without evidence of aspiration. Calorie counting. Plan for transfer to inpatient rehab pending insurance approval  Yolonda Kida PCCM Pager: [phone] Cell: [phone] If no response, call [phone]

## 2012-08-13 NOTE — Consult Note (Signed)
Physical Medicine and Rehabilitation Consult Reason for Consult: Deconditioning/CAP Referring Physician: ALVA   HPI: [name] is a 78 y.o. right-handed male with history of atrial fibrillation not a Coumadin candidate secondary to risk of falls, syncope and COPD and recent hospitalization June 2013 for community-acquired pneumonia and CHF exacerbation. Admitted 08/08/2012 with 3 day history of worsening shortness of breath, productive cough, sore throat and wheezing and fever of 100.5. Chest x-ray showed patchy bibasilar infiltrates or atelectasis left greater than right. Placed on broad-spectrum antibiotics. Noted on the evening of 08/08/2012 patient became more agitated increasing shortness of breath then became unresponsive and found to be pulseless chest compressions were started he was found to be in asystole received 2 rounds of epi and one amp of bicarbonate with down time of 9 minutes by code sheet. Critical care followup patient remained on ventilator support. Patient was later extubated 08/11/2012 and monitored. Maintained on subcutaneous Lovenox for DVT prophylaxis. Followup cardiology services() for history of atrial fibrillation and maintained on amiodarone as well as aspirin therapy. Followup speech therapy for questionable dysphagia currently on full liquid diet and monitored for any signs of aspiration. Physical and occupational therapy evaluations completed 08/11/2012 on going with recommendations of physical medicine rehabilitation consult to consider inpatient rehabilitation services secondary to deconditioning.   Review of Systems  Constitutional: Positive for fever and chills.  HENT: Positive for hearing loss.   Respiratory: Positive for cough and sputum production.   Cardiovascular: Positive for palpitations.  Musculoskeletal: Positive for myalgias and falls.  Neurological: Positive for weakness.  All other systems reviewed and are negative.   Past Medical History    Diagnosis Date  . Atrial fibrillation with slow ventricular response     Symptoms of weakness  . Paroxysmal atrial fibrillation     Hx of, currently on amiodarone load  . Syncope     Probably related to sinus pause, finally conversion from atrial fibrillation to sinus rhythm  . COPD (chronic obstructive pulmonary disease)   . Hypertension   . Abnormal CXR 07/06/2007; 06/15/2010    Eleveted R HD 07/06/2007 > no change 09/92/2011  . Chronic back pain   . 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 Joint replacement    Family History  Problem Relation Age of Onset  . Heart disease Brother     Atrial fibrillation  . Atopy Neg Hx    Social History:  reports that he quit smoking about 48 years ago. He does not have any smokeless tobacco history on file. He reports that he drinks about 3.5 ounces of alcohol per week. He reports that he does not use illicit drugs. Allergies: No Known Allergies Medications Prior to Admission  Medication Sig Dispense Refill  . amiodarone (PACERONE) 200 MG tablet Take 200 mg by mouth daily.      Marland Kitchen aspirin EC 81 MG tablet Take 81 mg by mouth daily.      . Calcium Carb-Cholecalciferol (CALCIUM + D3) 600-200 MG-UNIT TABS Take 1 tablet by mouth daily.      . cholecalciferol (VITAMIN D) 1000 UNITS tablet Take 1,000 Units by mouth daily.      Marland Kitchen doxepin (SINEQUAN) 10 MG capsule Take 10 mg by mouth at bedtime.      . dutasteride (AVODART) 0.5 MG capsule Take 0.5 mg by mouth daily.      . Melatonin 5 MG CAPS Take 1 capsule by mouth daily.      Marland Kitchen  mirtazapine (REMERON) 7.5 MG tablet Take 7.5 mg by mouth at bedtime.      Marland Kitchen omeprazole (PRILOSEC) 20 MG capsule Take 20 mg by mouth daily.      Marland Kitchen oxycodone (ROXICODONE) 30 MG immediate release tablet Take 60 mg by mouth every 8 (eight) hours.      . polyethylene glycol (MIRALAX / GLYCOLAX) packet Take 17 g by mouth daily.      . sodium chloride (OCEAN) 0.65 %  nasal spray Place 2 sprays into the nose 3 (three) times daily as needed. For dryness.      . Tamsulosin HCl (FLOMAX) 0.4 MG CAPS Take 0.4 mg by mouth daily.      . temazepam (RESTORIL) 30 MG capsule Take 30 mg by mouth at bedtime.        Home: Home Living Lives With: Alone Available Help at Discharge: Personal care attendant (for 4 hours in AM and 3 hours in PM.) Home Adaptive Equipment: Walker - rolling Additional Comments: has aides 7x week, 7 hours split  Functional History: Prior Function Comments: ambulated with RW Functional Status:  Mobility: Bed Mobility Bed Mobility: Supine to Sit;Sitting - Scoot to Edge of Bed Supine to Sit: 1: +2 Total assist Supine to Sit: Patient Percentage: 50% Transfers Transfers: Sit to Stand;Stand to Sit;Stand Pivot Transfers Sit to Stand: 1: +2 Total assist Sit to Stand: Patient Percentage: 50% Stand to Sit: 4: Min assist;To chair/3-in-1;With upper extremity assist Stand Pivot Transfers: 1: +2 Total assist Stand Pivot Transfers: Patient Percentage: 50% Ambulation/Gait Ambulation/Gait Assistance: Not tested (comment)    ADL: ADL Eating/Feeding: NPO (except ice chips) Grooming: Simulated;Set up Where Assessed - Grooming: Supported sitting Upper Body Bathing: Simulated;Set up Where Assessed - Upper Body Bathing: Supported sitting Lower Body Bathing: Simulated;+2 Total assistance Where Assessed - Lower Body Bathing: Supported sit to stand Upper Body Dressing: Simulated;Minimal assistance (lines) Where Assessed - Upper Body Dressing: Supported sitting Lower Body Dressing: Simulated;+2 Total assistance Where Assessed - Lower Body Dressing: Supported sit to Pharmacist, hospital: Chief of Staff Method: Surveyor, minerals:  (bed to chair) Tub/Shower Transfer: +2 Total assistance Transfers/Ambulation Related to ADLs: stand pivot transfer only ADL Comments: Pt just extubated today.  Had  intermittent help with adls  Cognition: Cognition Arousal/Alertness: Awake/alert Orientation Level: Oriented X4 Cognition Overall Cognitive Status: Appears within functional limits for tasks assessed/performed Arousal/Alertness: Awake/alert Orientation Level: Appears intact for tasks assessed Behavior During Session: [facility] for tasks performed  Blood pressure 126/79, pulse 92, temperature 99 F (37.2 C), temperature source Oral, resp. rate 28, height 5\' 9"  (1.753 m), weight 63.4 kg (139 lb 12.4 oz), SpO2 96.00%. Physical Exam  Constitutional:       Frail elderly male  HENT:  Head: Normocephalic.  Eyes:       Pupils reactive to light  Neck: Neck supple. No thyromegaly present.  Cardiovascular:       Cardiac rate is controlled  Pulmonary/Chest:       Decreased breath sounds at the bases  Abdominal: Soft. Bowel sounds are normal. He exhibits no distension.  Musculoskeletal: He exhibits no edema.  Neurological: He is alert.       Patient was able to name person place and date of birth. He did need some cues for overall awareness. He followed basic commands  Skin: Skin is warm and dry.  Motor strength 4 minus/5 in bilateral deltoid, biceps, and triceps, grip 4 minus/5 in bilateral hip flexors knee extensors ankle dorsiflexors and  plantar flexors Sensation intact to light touch in both upper and lower limbs  Results for orders placed during the hospital encounter of 08/08/12 (from the past 24 hour(s))  CBC     Status: Abnormal   Collection Time   08/13/12  3:53 AM      Component Value Range   WBC 5.6  4.0 - 10.5 K/uL   RBC 2.62 (*) 4.22 - 5.81 MIL/uL   Hemoglobin 9.3 (*) 13.0 - 17.0 g/dL   HCT 16.1 (*) 09.6 - 04.5 %   MCV 107.3 (*) 78.0 - 100.0 fL   MCH 35.5 (*) 26.0 - 34.0 pg   MCHC 33.1  30.0 - 36.0 g/dL   RDW 40.9  81.1 - 91.4 %   Platelets 307  150 - 400 K/uL  BASIC METABOLIC PANEL     Status: Abnormal   Collection Time   08/13/12  3:53 AM      Component Value Range    Sodium 143  135 - 145 mEq/L   Potassium 4.4  3.5 - 5.1 mEq/L   Chloride 107  96 - 112 mEq/L   CO2 29  19 - 32 mEq/L   Glucose, Bld 103 (*) 70 - 99 mg/dL   BUN 21  6 - 23 mg/dL   Creatinine, Ser 7.82  0.50 - 1.35 mg/dL   Calcium 8.7  8.4 - 95.6 mg/dL   GFR calc non Af Amer 77 (*) >90 mL/min   GFR calc Af Amer 89 (*) >90 mL/min  MAGNESIUM     Status: Normal   Collection Time   08/13/12  3:53 AM      Component Value Range   Magnesium 1.8  1.5 - 2.5 mg/dL   No results found.  Assessment/Plan: Diagnosis: Deconditioning From pneumonia with subsequent respiratory failure and cardiac arrest 1. Does the need for close, 24 hr/day medical supervision in concert with the patient's rehab needs make it unreasonable for this patient to be served in a less intensive setting? Yes 2. Co-Morbidities requiring supervision/potential complications: COPD, hypertension, atrial fibrillation, diastolic heart failure, anemia 3. Due to bladder management, bowel management, safety, skin/wound care, disease management, medication administration, pain management and patient education, does the patient require 24 hr/day rehab nursing? Yes 4. Does the patient require coordinated care of a physician, rehab nurse, PT (1-2 hrs/day, 5 days/week), OT (1-2 hrs/day, 5 days/week) and SLP (1 hrs/day, 1 days/week) to address physical and functional deficits in the context of the above medical diagnosis(es)? Yes Addressing deficits in the following areas: balance, endurance, locomotion, strength, transferring, bowel/bladder control, bathing, dressing, feeding, grooming, toileting and swallowing 5. Can the patient actively participate in an intensive therapy program of at least 3 hrs of therapy per day at least 5 days per week? Yes 6. The potential for patient to make measurable gains while on inpatient rehab is good 7. Anticipated functional outcomes upon discharge from inpatient rehab are Supervision mobility with PT, Supervision  to min assist ADLs with OT, Eval swallowing with SLP. 8. Estimated rehab length of stay to reach the above functional goals is: 7-10 days 9. Does the patient have adequate social supports to accommodate these discharge functional goals? Potentially 10. Anticipated D/C setting: Home 11. Anticipated post D/C treatments: HH therapy 12. Overall Rehab/Functional Prognosis: good  RECOMMENDATIONS: This patient's condition is appropriate for continued rehabilitative care in the following setting: CIR Patient has agreed to participate in recommended program. Potentially Note that insurance prior authorization may be required for reimbursement for  recommended care.  Comment:Patient had a 7 day a week 7 hour per day caregiver. His daughter reportedly wants him to have 24 7 supervision. Patient is refusing this thus far    08/13/2012

## 2012-08-13 NOTE — Progress Notes (Signed)
Nutrition Note  Reason: Calorie Count in progress  PO intake obtained from 2 meals. Meal 1, 642 kcal and 8 grams protein daily. Meal 2 677 kcal and 12 grams of protein. Based on 2 meals, average meal intake is 659 kcal and 10 grams of protein.   Will continue to monitor kcal intake.   Iven Finn [facility] [phone]

## 2012-08-13 NOTE — Progress Notes (Signed)
Physical Therapy Treatment Patient Details Name: [name] MRN: [id number] DOB: 05-Jan-1922 Today's Date: 08/13/2012 Time: 1478-2956 PT Time Calculation (min): 23 min  PT Assessment / Plan / Recommendation Comments on Treatment Session  Pt. will benefit from CIR. Pt. is able to participate in therapy. Continue PT.    Follow Up Recommendations  Post acute inpatient     Does the patient have the potential to tolerate intense rehabilitation  Yes, Recommend IP Rehab Screening  Barriers to Discharge        Equipment Recommendations  None recommended by PT    Recommendations for Other Services    Frequency Min 3X/week   Plan Discharge plan remains appropriate;Frequency remains appropriate    Precautions / Restrictions Precautions Precautions: Fall Precaution Comments: aspiration   Pertinent Vitals/Pain HR 110, sats low 90 on RA with activity.  Back pain 6-7/10 when moving. RN notified/.   Mobility  Bed Mobility Supine to Sit: 3: Mod assist;HOB elevated;With rails Sitting - Scoot to Edge of Bed: 3: Mod assist;With rail Details for Bed Mobility Assistance: Pt takes extra time. has back pain. Transfers Sit to Stand: 3: Mod assist;From chair/3-in-1;With armrests;From bed;With upper extremity assist Stand to Sit: 4: Min assist;With armrests;To chair/3-in-1;With upper extremity assist Stand Pivot Transfers: 3: Mod assist Stand Pivot Transfers: Patient Percentage: 60% Details for Transfer Assistance: VC to hold onto RW to turn to [facility], VC to completely turn. Ambulation/Gait Ambulation/Gait Assistance: 1: +2 Total assist Ambulation/Gait: Patient Percentage: 60% Ambulation Distance (Feet): 5 Feet Assistive device: Rolling walker Ambulation/Gait Assistance Details: side steps to recliner, VC to completely turn to recliner prior to sitting. Gait Pattern: Step-to pattern;Trunk flexed    Exercises     PT Diagnosis:    PT Problem List:   PT Treatment Interventions:     PT  Goals Acute Rehab PT Goals Pt will go Supine/Side to Sit: with supervision PT Goal: Supine/Side to Sit - Progress: Progressing toward goal Pt will go Stand to Sit: with supervision PT Goal: Stand to Sit - Progress: Progressing toward goal Pt will Ambulate: 51 - 150 feet;with supervision;with rolling walker PT Goal: Ambulate - Progress: Progressing toward goal  Visit Information  Last PT Received On: 08/13/12 Assistance Needed: +2    Subjective Data  Subjective: will I stay overnite  ? I have a problem with my swallow. I may need a tube.   Cognition  Overall Cognitive Status: Appears within functional limits for tasks assessed/performed Arousal/Alertness: Awake/alert Orientation Level: Appears intact for tasks assessed Behavior During Session: [facility] for tasks performed    Balance  Static Sitting Balance Static Sitting - Balance Support: Feet supported Static Sitting - Level of Assistance: 4: Min assist Static Sitting - Comment/# of Minutes: tends to lean backward when talking while sitting on edge of bed.  End of Session PT - End of Session Patient left: in chair;with call bell/phone within reach (caregiver from home ) Nurse Communication: Mobility status;Patient requests pain meds   GP     Rada Hay 08/13/2012, 9:37 AM

## 2012-08-13 NOTE — Clinical Social Work Note (Signed)
CSW checked in on Pt to discuss SNF offers as a back up to CIR. Pt eager for CIR and really not interested in SNF at this time. CSW left list of bed offers and placed FL2 in chart as a back up plan. CSW spoke with CIR and no bed availability today. Pt is wondering if he can have his CNA with him a few hours to help learn therapies and be aware of the plan once he is d/c'd from CIR. CSW left vm for CiR to discuss further with Pt. CSW will continue to follow in case CIR does not work out.   Doreen Salvage, LCSW ICU/Stepdown Clinical Social Worker [facility] [phone] Hours 8am-1200pm M-F

## 2012-08-13 NOTE — Discharge Summary (Signed)
Physician Discharge Summary     Patient ID: [name] MRN: [id number] DOB/AGE: [birth year] 76 y.o.  Admit date: 08/08/2012 Discharge date: 08/14/2012  Admission Diagnoses: Patient Active Problem List  Diagnosis  . HYPERTENSION, BENIGN  . Atrial fibrillation  . DIASTOLIC HEART FAILURE, CHRONIC  . DYSPNEA  . CHEST PAIN UNSPECIFIED  . Nonspecific (abnormal) findings on radiological and other examination of body structure  . ABNORMAL LUNG XRAY  . Community acquired pneumonia  . Acute respiratory failure  . CHF (congestive heart failure)  . COPD (chronic obstructive pulmonary disease)  . Anemia  . Esophageal diverticulum  . Cardiac arrest   Discharge Diagnoses:  Principal Problem:  *Community acquired pneumonia Active Problems:  HYPERTENSION, BENIGN  Atrial fibrillation  DIASTOLIC HEART FAILURE, CHRONIC  DYSPNEA  COPD (chronic obstructive pulmonary disease)  Anemia  Cardiac arrest   Significant Hospital tests/ studies/ interventions and procedures  ETT: 10/26 >>>10/29 10/26: PEA arrest with 9 minutes of CPR, transfer to ICU  10/28: Extubated  Brief history  78 yo M with afib on amio (no coumadin for fall risk) , HTN and recent admission for CAP in 03/2012 who was admitted 10/26 with worsening shortness of breath, cough productive of clear sputum and wheezing. He was noted to have a temperature to 100.19F. Chest x-ray showed bibasilar opacities. He was reluctant to be admitted but agreed to come into the hospital overnight. He was started on ceftriaxone and azithromycin.  10/26 evening he was noted to be agitated. He received 0.5 mg ativan. He later used the bedside and was noted to have difficulty breathing and was chocking on his secretions. He then became unresponsive and was found to be pulseless. Chest compression started and he was found to be in asystole. He received 2 rounds of epi and one amp of bicarb with ROSC- downtime 9 mins by code sheet. He was transferred to  the ICU for further evaluation and care.    Hospital Course:  1) Acute on chronic shortness of breath, Presumed respiratory arrest.  2) PNA (NOS) 3) Chronic aspiration with known Zenker's Diverticulum.  4) H/O COPD Admitted to the ICU after arrest event on 10/26. Treatment was supportive and consisted of mechanical ventilation, IV sedation, bronchodilators and empiric antibiotics. He was monitored for further arrhythmias, acute cardiac event such as MI was ruled out and cardiology consultation was cardiacHe was successfully extubated on 10/28. Has made slow and steady progress since extubation. We had a SLP eval on 10/30 to further evaluate aspiration risk. The impression was as follows:   ""Pt has a complex multifactorial dysphagia including suspected pharyngeal deficits (per esophagram 2012 when barium tablet lodged in pharynx for several minutes without pt awareness), esophageal dysmotility, mid-esophagus diverticuli ( two right sided just below carina* ) per previous esophagrams."   For this the following recommendations were made:  "Rec full liquid diet (with known risks) and strict precautions to compensate for pharyngeal and esophageal issues."  It is hoped that his dysphagia will improve with time. At time of discharge [name] continues to improve. He will complete a total of 10 days therapy for PNA. He was started on ceftriaxone and azithro on 10/26, these were continued until 10/29 at which time he was transitioned to oral avelox.  Recommendations -Wean O2  -respiratory hygiene  -PT/OT  -Incentive spiro  -history of COPD, likely chronic bronchitis by history, continue pulmicort and brovana  -complete 10 d total abx, last dose of avelox to be on 11/5 -Continue dysphagia  precautions--> We and I had a lengthy conversation regarding his aspiration; he is in favor of a gastrostomy tube if needed for nutrition, but would prefer to start with oral supplements (Ensure, etc) first while  working with Speech to improve his swallowing function; if he is unable to keep up with his caloric needs or if his swallowing does not improve then will initiate tube feedings   5) Asystolic arrest, etiology unclear - ? Sick sinus, also consider hypoxia, aspiration, vagal. Chronic afib  See above re: "code blue"  specifics. Seen by EP. They feel that this was precipitated by coughing, thick sputum and resultant brady event but do note that he has had syncopal event in past. He has been hemodynamically stable since his admit to the ICU and during his entire stay after the initial event.  Recommendation  -will need cardiology follow up post discharge ([facility]) -PO amiodarone  -Telemetry monitoring  6) Macrocytic anemia  B12 WNL. No evidence of bleeding  Recommendation  No role for transfusion currently  Trend CBC.   7) Transaminitis after cardiac arrest - resolved  8) physical deconditioning after cardiac arrest. Working w/ PT/OT. Wants to go home. Still very debilitated. Needs extensive assistance to regain maximum functional status.   Discharge Exam: BP 126/86  Pulse 108  Temp 99.4 F (37.4 C) (Oral)  Resp 20  Ht 5\' 9"  (1.753 m)  Wt 63.4 kg (139 lb 12.4 oz)  BMI 20.64 kg/m2  SpO2 92% Room air  Physical Examination:  General: Awake, alert.  Neuro: No focal def, oriented X 3  HEENT: Sclera clear, mucus membranes moist  Neck: supple  Cardiovascular: Irreg, irreg, + S3  Lungs: scattered rhonchi, no accessory muscle use, improve w/ cough  Abdomen: Soft, NT, ND  Musculoskeletal: WNL  Skin: No rash   Labs at discharge Lab Results  Component Value Date   CREATININE 0.82 08/13/2012   BUN 21 08/13/2012   NA 143 08/13/2012   K 4.4 08/13/2012   CL 107 08/13/2012   CO2 29 08/13/2012   Lab Results  Component Value Date   WBC 5.6 08/13/2012   HGB 9.3* 08/13/2012   HCT 28.1* 08/13/2012   MCV 107.3* 08/13/2012   PLT 307 08/13/2012   Lab Results  Component Value  Date   ALT 35 08/12/2012   AST 22 08/12/2012   ALKPHOS 85 08/12/2012   BILITOT 0.7 08/12/2012   Lab Results  Component Value Date   INR 1.29 08/08/2012   INR 1.08 08/08/2012   INR 2.05* 05/08/2012    Current radiology studies No results found.  Disposition:  rehab       Discharge Orders    Future Appointments: Provider: Department: Dept Phone: Center:   09/03/2012 9:30 AM Oretha Milch, MD [facility] [phone] None     Future Orders Please Complete By Expires   Diet - low sodium heart healthy      Scheduling Instructions:   Continue dysphagia diet   Increase activity slowly          Medication List     As of 08/14/2012 10:22 AM    STOP taking these medications         aspirin EC 81 MG tablet      doxepin 10 MG capsule   Commonly known as: SINEQUAN      dutasteride 0.5 MG capsule   Commonly known as: AVODART      fentaNYL 25 MCG/HR   Commonly known as: DURAGESIC -  dosed mcg/hr      Melatonin 5 MG Caps      omeprazole 20 MG capsule   Commonly known as: PRILOSEC      oxyCODONE 10 MG 12 hr tablet   Commonly known as: OXYCONTIN      oxyCODONE-acetaminophen 10-325 MG per tablet   Commonly known as: PERCOCET      polyethylene glycol packet   Commonly known as: MIRALAX / GLYCOLAX      warfarin 1 MG tablet   Commonly known as: COUMADIN      TAKE these medications         acetaminophen 325 MG tablet   Commonly known as: TYLENOL   Take 2 tablets (650 mg total) by mouth every 4 (four) hours as needed.      amiodarone 200 MG tablet   Commonly known as: PACERONE   Take 200 mg by mouth daily.      arformoterol 15 MCG/2ML Nebu   Commonly known as: BROVANA   Take 2 mLs (15 mcg total) by nebulization 2 (two) times daily.      aspirin 81 MG chewable tablet   Place 1 tablet (81 mg total) into feeding tube daily.      budesonide 0.25 MG/2ML nebulizer solution   Commonly known as: PULMICORT   Take 2 mLs (0.25 mg total) by nebulization 2 (two)  times daily.      Calcium + D3 600-200 MG-UNIT Tabs   Take 1 tablet by mouth daily.      cholecalciferol 1000 UNITS tablet   Commonly known as: VITAMIN D   Take 1,000 Units by mouth daily.      enoxaparin 40 MG/0.4ML injection   Commonly known as: LOVENOX   Inject 0.4 mLs (40 mg total) into the skin daily.      feeding supplement Liqd   Take 237 mLs by mouth 3 (three) times daily between meals.      feeding supplement Liqd   Take 1 Container by mouth 2 (two) times daily between meals.      levalbuterol 0.63 MG/3ML nebulizer solution   Commonly known as: XOPENEX   Take 3 mLs (0.63 mg total) by nebulization every 4 (four) hours as needed for wheezing or shortness of breath.      mirtazapine 7.5 MG tablet   Commonly known as: REMERON   Take 1 tablet (7.5 mg total) by mouth at bedtime.      moxifloxacin 400 MG tablet   Commonly known as: AVELOX   Take 1 tablet (400 mg total) by mouth daily at 8 pm.      oxyCODONE 5 MG immediate release tablet   Commonly known as: Oxy IR/ROXICODONE   Take 1 tablet (5 mg total) by mouth every 4 (four) hours as needed.      pantoprazole sodium 40 mg/20 mL Pack   Commonly known as: PROTONIX   Take 20 mLs (40 mg total) by mouth daily at 12 noon.      senna 8.6 MG Tabs   Commonly known as: SENOKOT   Take 1 tablet (8.6 mg total) by mouth 2 (two) times daily.      sodium chloride 0.65 % nasal spray   Commonly known as: OCEAN   Place 2 sprays into the nose 3 (three) times daily as needed. For dryness.      sodium chloride 0.9 % injection   Inject 3 mLs into the vein every 12 (twelve) hours.      Tamsulosin HCl 0.4  MG Caps   Commonly known as: FLOMAX   Take 0.4 mg by mouth daily.      temazepam 30 MG capsule   Commonly known as: RESTORIL   Take 30 mg by mouth at bedtime.          Discharged Condition: {fair, still has significant rehab needs. Physician Statement:   The Patient was personally examined, the discharge assessment and  plan has been personally reviewed and I agree with ACNP Babcock's assessment and plan. > 30 minutes of time have been dedicated to discharge assessment, planning and discharge instructions.   SignedAnders Simmonds 08/14/2012, 10:22 AM  Heber [place] Graham MD [facility] [phone]

## 2012-08-13 NOTE — Progress Notes (Signed)
Occupational Therapy Treatment Patient Details Name: [name] MRN: [id number] DOB: [birth year] Today's Date: 08/13/2012 Time: 4098-1191 OT Time Calculation (min): 23 min  OT Assessment / Plan / Recommendation Comments on Treatment Session Pt tolerated well today. Con't to recommend CIR vs SNF.    Follow Up Recommendations  Inpatient Rehab    Barriers to Discharge       Equipment Recommendations  None recommended by OT    Recommendations for Other Services    Frequency Min 2X/week   Plan Discharge plan remains appropriate    Precautions / Restrictions Precautions Precautions: Fall Precaution Comments: aspiration   Pertinent Vitals/Pain Pt reported back pain with mobility which he did not rate. Repositioned for comfort.    ADL  Toilet Transfer: Performed;+2 Total assistance Toilet Transfer: Patient Percentage: 60% Toilet Transfer Method: Stand pivot Toilet Transfer Equipment: Bedside commode Toileting - Clothing Manipulation and Hygiene: Performed;+2 Total assistance (assist needed for thoroughness w hygeine.) Toileting - Clothing Manipulation and Hygiene: Patient Percentage: 50% Transfers/Ambulation Related to ADLs: After toileting. Pt took several side steps to bedside chair with +2 A.    OT Diagnosis:    OT Problem List:   OT Treatment Interventions:     OT Goals ADL Goals ADL Goal: Toilet Transfer - Progress: Progressing toward goals ADL Goal: Toileting - Hygiene - Progress: Progressing toward goals Miscellaneous OT Goals OT Goal: Miscellaneous Goal #1 - Progress: Progressing toward goals  Visit Information  Last OT Received On: 08/13/12 Assistance Needed: +2 PT/OT Co-Evaluation/Treatment: Yes    Subjective Data  Subjective: I have to use the bathroom   Prior Functioning       Cognition  Overall Cognitive Status: Appears within functional limits for tasks assessed/performed Arousal/Alertness: Awake/alert Orientation Level: Appears intact for tasks  assessed Behavior During Session: [facility] for tasks performed    Mobility  Shoulder Instructions Bed Mobility Supine to Sit: 3: Mod assist;HOB elevated;With rails Sitting - Scoot to Edge of Bed: 3: Mod assist;With rail Details for Bed Mobility Assistance: Pt takes extra time. has back pain. Transfers Sit to Stand: 3: Mod assist;From chair/3-in-1;With armrests;From bed;With upper extremity assist Stand to Sit: 4: Min assist;With armrests;To chair/3-in-1;With upper extremity assist Details for Transfer Assistance: VC to hold onto RW to turn to [facility], VC to completely turn.       Exercises      Balance Static Sitting Balance Static Sitting - Balance Support: Feet supported Static Sitting - Level of Assistance: 4: Min assist Static Sitting - Comment/# of Minutes: tends to lean backward when talking while sitting on edge of bed.   End of Session OT - End of Session Activity Tolerance: Patient tolerated treatment well Patient left: in chair;with call bell/phone within reach;with family/visitor present  GO     Faria Casella A OTR/L [phone] 08/13/2012, 9:50 AM

## 2012-08-14 ENCOUNTER — Inpatient Hospital Stay (HOSPITAL_COMMUNITY)
Admission: RE | Admit: 2012-08-14 | Discharge: 2012-08-22 | DRG: 945 | Disposition: A | Discharge status: Home Health | Payer: Medicare Other | Source: Ambulatory Visit | Attending: Physical Medicine & Rehabilitation | Admitting: Physical Medicine & Rehabilitation

## 2012-08-14 DIAGNOSIS — J4489 Other specified chronic obstructive pulmonary disease: Secondary | ICD-10-CM | POA: Diagnosis present

## 2012-08-14 DIAGNOSIS — J449 Chronic obstructive pulmonary disease, unspecified: Secondary | ICD-10-CM | POA: Diagnosis present

## 2012-08-14 DIAGNOSIS — R1310 Dysphagia, unspecified: Secondary | ICD-10-CM | POA: Diagnosis present

## 2012-08-14 DIAGNOSIS — G4700 Insomnia, unspecified: Secondary | ICD-10-CM | POA: Diagnosis present

## 2012-08-14 DIAGNOSIS — R5381 Other malaise: Secondary | ICD-10-CM

## 2012-08-14 DIAGNOSIS — I10 Essential (primary) hypertension: Secondary | ICD-10-CM | POA: Diagnosis present

## 2012-08-14 DIAGNOSIS — J159 Unspecified bacterial pneumonia: Secondary | ICD-10-CM

## 2012-08-14 DIAGNOSIS — Z87891 Personal history of nicotine dependence: Secondary | ICD-10-CM

## 2012-08-14 DIAGNOSIS — I469 Cardiac arrest, cause unspecified: Secondary | ICD-10-CM | POA: Diagnosis present

## 2012-08-14 DIAGNOSIS — J9600 Acute respiratory failure, unspecified whether with hypoxia or hypercapnia: Secondary | ICD-10-CM | POA: Diagnosis present

## 2012-08-14 DIAGNOSIS — Z5189 Encounter for other specified aftercare: Principal | ICD-10-CM

## 2012-08-14 DIAGNOSIS — I509 Heart failure, unspecified: Secondary | ICD-10-CM | POA: Diagnosis present

## 2012-08-14 DIAGNOSIS — I5030 Unspecified diastolic (congestive) heart failure: Secondary | ICD-10-CM | POA: Diagnosis present

## 2012-08-14 DIAGNOSIS — R092 Respiratory arrest: Secondary | ICD-10-CM | POA: Diagnosis present

## 2012-08-14 DIAGNOSIS — I4891 Unspecified atrial fibrillation: Secondary | ICD-10-CM | POA: Diagnosis present

## 2012-08-14 DIAGNOSIS — Z7982 Long term (current) use of aspirin: Secondary | ICD-10-CM

## 2012-08-14 DIAGNOSIS — J189 Pneumonia, unspecified organism: Secondary | ICD-10-CM | POA: Diagnosis present

## 2012-08-14 LAB — CULTURE, BLOOD (ROUTINE X 2): Culture: NO GROWTH

## 2012-08-14 MED ORDER — PANTOPRAZOLE SODIUM 40 MG PO PACK
40.0000 mg | PACK | Freq: Every day | ORAL | Status: DC
  Administered 2012-08-14 – 2012-08-21 (×7): 40 mg via ORAL
  Filled 2012-08-14 (×11): qty 20

## 2012-08-14 MED ORDER — SENNOSIDES-DOCUSATE SODIUM 8.6-50 MG PO TABS: 1.0000 | ORAL_TABLET | Freq: Every evening | ORAL | Status: DC | PRN

## 2012-08-14 MED ORDER — LEVALBUTEROL HCL 0.63 MG/3ML IN NEBU
0.6300 mg | INHALATION_SOLUTION | RESPIRATORY_TRACT | Status: DC | PRN
  Filled 2012-08-14: qty 3

## 2012-08-14 MED ORDER — ENSURE COMPLETE PO LIQD
237.0000 mL | Freq: Three times a day (TID) | ORAL | Status: DC
  Administered 2012-08-14 – 2012-08-22 (×13): 237 mL via ORAL

## 2012-08-14 MED ORDER — BIOTENE DRY MOUTH MT LIQD
15.0000 mL | Freq: Two times a day (BID) | OROMUCOSAL | Status: DC
  Administered 2012-08-14 – 2012-08-22 (×14): 15 mL via OROMUCOSAL

## 2012-08-14 MED ORDER — PANTOPRAZOLE SODIUM 40 MG PO PACK: 40.0000 mg | PACK | Freq: Every day | ORAL | Status: DC

## 2012-08-14 MED ORDER — ENOXAPARIN SODIUM 40 MG/0.4ML ~~LOC~~ SOLN: 40.0000 mg | SUBCUTANEOUS | Status: DC

## 2012-08-14 MED ORDER — OXYCODONE HCL 5 MG PO TABS: 5.0000 mg | ORAL_TABLET | ORAL | Status: DC | PRN

## 2012-08-14 MED ORDER — BOOST / RESOURCE BREEZE PO LIQD
1.0000 | Freq: Two times a day (BID) | ORAL | Status: DC
  Administered 2012-08-15 – 2012-08-16 (×3): 1 via ORAL

## 2012-08-14 MED ORDER — VITAMIN D3 25 MCG (1000 UNIT) PO TABS
1000.0000 [IU] | ORAL_TABLET | Freq: Every day | ORAL | Status: DC
  Administered 2012-08-14 – 2012-08-22 (×9): 1000 [IU] via ORAL
  Filled 2012-08-14 (×10): qty 1

## 2012-08-14 MED ORDER — CALCIUM CARBONATE-VITAMIN D 500-200 MG-UNIT PO TABS
1.0000 | ORAL_TABLET | Freq: Every day | ORAL | Status: DC
  Administered 2012-08-15 – 2012-08-22 (×8): 1 via ORAL
  Filled 2012-08-14 (×9): qty 1

## 2012-08-14 MED ORDER — SORBITOL 70 % SOLN
30.0000 mL | Freq: Every day | Status: DC | PRN
  Administered 2012-08-14 – 2012-08-15 (×2): 30 mL via ORAL
  Filled 2012-08-14 (×2): qty 30

## 2012-08-14 MED ORDER — ASPIRIN 81 MG PO CHEW: 81.0000 mg | CHEWABLE_TABLET | Freq: Every day | ORAL | Status: DC

## 2012-08-14 MED ORDER — SENNA 8.6 MG PO TABS: 1.0000 | ORAL_TABLET | Freq: Two times a day (BID) | ORAL | Status: DC

## 2012-08-14 MED ORDER — MIRTAZAPINE 7.5 MG PO TABS
7.5000 mg | ORAL_TABLET | Freq: Every day | ORAL | Status: DC
Start: 2012-08-14 — End: 2014-01-06

## 2012-08-14 MED ORDER — BUDESONIDE 0.25 MG/2ML IN SUSP
0.2500 mg | Freq: Two times a day (BID) | RESPIRATORY_TRACT | Status: DC
  Administered 2012-08-14 – 2012-08-22 (×15): 0.25 mg via RESPIRATORY_TRACT
  Filled 2012-08-14 (×19): qty 2

## 2012-08-14 MED ORDER — ONDANSETRON HCL 4 MG PO TABS: 4.0000 mg | ORAL_TABLET | Freq: Four times a day (QID) | ORAL | Status: DC | PRN

## 2012-08-14 MED ORDER — ARFORMOTEROL TARTRATE 15 MCG/2ML IN NEBU
15.0000 ug | INHALATION_SOLUTION | Freq: Two times a day (BID) | RESPIRATORY_TRACT | Status: DC
  Administered 2012-08-14 – 2012-08-22 (×14): 15 ug via RESPIRATORY_TRACT
  Filled 2012-08-14 (×20): qty 2

## 2012-08-14 MED ORDER — ARFORMOTEROL TARTRATE 15 MCG/2ML IN NEBU: 15.0000 ug | INHALATION_SOLUTION | Freq: Two times a day (BID) | RESPIRATORY_TRACT | Status: DC

## 2012-08-14 MED ORDER — BUDESONIDE 0.25 MG/2ML IN SUSP: 0.2500 mg | Freq: Two times a day (BID) | RESPIRATORY_TRACT | Status: DC

## 2012-08-14 MED ORDER — LEVALBUTEROL HCL 0.63 MG/3ML IN NEBU: 0.6300 mg | INHALATION_SOLUTION | RESPIRATORY_TRACT | Status: DC | PRN

## 2012-08-14 MED ORDER — MOXIFLOXACIN HCL 400 MG PO TABS
400.0000 mg | ORAL_TABLET | Freq: Every day | ORAL | Status: DC
  Administered 2012-08-14 – 2012-08-19 (×6): 400 mg via ORAL
  Filled 2012-08-14 (×8): qty 1

## 2012-08-14 MED ORDER — ACETAMINOPHEN 325 MG PO TABS
325.0000 mg | ORAL_TABLET | ORAL | Status: DC | PRN
  Administered 2012-08-15 – 2012-08-21 (×5): 650 mg via ORAL
  Filled 2012-08-14 (×6): qty 2

## 2012-08-14 MED ORDER — MIRTAZAPINE 7.5 MG PO TABS
7.5000 mg | ORAL_TABLET | Freq: Every day | ORAL | Status: DC
  Administered 2012-08-14 – 2012-08-21 (×8): 7.5 mg via ORAL
  Filled 2012-08-14 (×9): qty 1

## 2012-08-14 MED ORDER — ASPIRIN 81 MG PO CHEW
81.0000 mg | CHEWABLE_TABLET | Freq: Every day | ORAL | Status: DC
  Administered 2012-08-15 – 2012-08-22 (×8): 81 mg
  Filled 2012-08-14 (×10): qty 1

## 2012-08-14 MED ORDER — ONDANSETRON HCL 4 MG/2ML IJ SOLN: 4.0000 mg | Freq: Four times a day (QID) | INTRAMUSCULAR | Status: DC | PRN

## 2012-08-14 MED ORDER — BOOST / RESOURCE BREEZE PO LIQD: 1.0000 | Freq: Two times a day (BID) | ORAL | Status: DC

## 2012-08-14 MED ORDER — ENSURE COMPLETE PO LIQD: 237.0000 mL | Freq: Three times a day (TID) | ORAL | Status: DC

## 2012-08-14 MED ORDER — TAMSULOSIN HCL 0.4 MG PO CAPS
0.4000 mg | ORAL_CAPSULE | Freq: Every day | ORAL | Status: DC
  Administered 2012-08-14 – 2012-08-22 (×9): 0.4 mg via ORAL
  Filled 2012-08-14 (×10): qty 1

## 2012-08-14 MED ORDER — ENOXAPARIN SODIUM 40 MG/0.4ML ~~LOC~~ SOLN
40.0000 mg | SUBCUTANEOUS | Status: DC
  Administered 2012-08-15 – 2012-08-21 (×7): 40 mg via SUBCUTANEOUS
  Filled 2012-08-14 (×8): qty 0.4

## 2012-08-14 MED ORDER — AMIODARONE HCL 200 MG PO TABS
200.0000 mg | ORAL_TABLET | Freq: Every day | ORAL | Status: DC
  Administered 2012-08-14 – 2012-08-22 (×9): 200 mg via ORAL
  Filled 2012-08-14 (×10): qty 1

## 2012-08-14 MED ORDER — PNEUMOCOCCAL VAC POLYVALENT 25 MCG/0.5ML IJ INJ
0.5000 mL | INJECTION | INTRAMUSCULAR | Status: AC
  Filled 2012-08-14: qty 0.5

## 2012-08-14 MED ORDER — INFLUENZA VIRUS VACC SPLIT PF IM SUSP
0.5000 mL | INTRAMUSCULAR | Status: AC
  Filled 2012-08-14: qty 0.5

## 2012-08-14 MED ORDER — MOXIFLOXACIN HCL 400 MG PO TABS: 400.0000 mg | ORAL_TABLET | Freq: Every day | ORAL | Status: AC

## 2012-08-14 MED ORDER — ACETAMINOPHEN 325 MG PO TABS: 650.0000 mg | ORAL_TABLET | ORAL | Status: DC | PRN

## 2012-08-14 MED ORDER — SODIUM CHLORIDE 0.9 % IJ SOLN: 3.0000 mL | Freq: Two times a day (BID) | INTRAMUSCULAR | Status: DC

## 2012-08-14 MED ORDER — OXYCODONE HCL 5 MG PO TABS
5.0000 mg | ORAL_TABLET | ORAL | Status: DC | PRN
  Administered 2012-08-14 – 2012-08-22 (×28): 5 mg via ORAL
  Filled 2012-08-14 (×28): qty 1

## 2012-08-14 NOTE — H&P (View-Only) (Signed)
Physical Medicine and Rehabilitation Admission H&P    Chief Complaint  Patient presents with  . Wheezing  . COPD  . Shortness of Breath  : HPI: [name] is a 78 y.o. right-handed male with history of atrial fibrillation not a Coumadin candidate secondary to risk of falls, syncope and COPD and recent hospitalization June 2013 for community-acquired pneumonia and CHF exacerbation. Admitted 08/08/2012 with 3 day history of worsening shortness of breath, productive cough, sore throat and wheezing and fever of 100.5. Chest x-ray showed patchy bibasilar infiltrates or atelectasis left greater than right. Placed on broad-spectrum antibiotics. Noted on the evening of 08/08/2012 patient became more agitated increasing shortness of breath then became unresponsive and found to be pulseless chest compressions were started he was found to be in asystole received 2 rounds of epi and one amp of bicarbonate with down time of 9 minutes by code sheet. Critical care followup patient remained on ventilator support. Patient was later extubated 08/11/2012 and monitored. Maintained on subcutaneous Lovenox for DVT prophylaxis. Followup cardiology services([facility]) for history of atrial fibrillation and maintained on amiodarone as well as aspirin therapy. Followup speech therapy for questionable dysphagia currently on full liquid diet and monitored for any signs of aspiration. Physical and occupational therapy evaluations completed 08/11/2012 on going with recommendations of physical medicine rehabilitation consult to consider inpatient rehabilitation services secondary to deconditioning. Patient was felt to be a good candidate for inpatient rehabilitation services and was admitted for comprehensive rehabilitation program  Review of Systems  Constitutional: Positive for fever and chills.  HENT: Positive for hearing loss.  Respiratory: Positive for cough and sputum production.  Cardiovascular: Positive for palpitations.    Musculoskeletal: Positive for myalgias and falls. Chronic back pain  Neurological: Positive for weakness. Depression All other systems reviewed and are negative   Past Medical History  Diagnosis Date  . Atrial fibrillation with slow ventricular response     Symptoms of weakness  . Paroxysmal atrial fibrillation     Hx of, currently on amiodarone load  . Syncope     Probably related to sinus pause, finally conversion from atrial fibrillation to sinus rhythm  . COPD (chronic obstructive pulmonary disease)   . Hypertension   . Abnormal CXR 07/06/2007; 06/15/2010    Eleveted R HD 07/06/2007 > no change 09/92/2011  . Chronic back pain   . 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 Joint replacement    Family History  Problem Relation Age of Onset  . Heart disease Brother     Atrial fibrillation  . Atopy Neg Hx    Social History:  reports that he quit smoking about 48 years ago. He does not have any smokeless tobacco history on file. He reports that he drinks about 3.5 ounces of alcohol per week. He reports that he does not use illicit drugs. Allergies: No Known Allergies Medications Prior to Admission  Medication Sig Dispense Refill  . amiodarone (PACERONE) 200 MG tablet Take 200 mg by mouth daily.      . aspirin EC 81 MG tablet Take 81 mg by mouth daily.      . Calcium Carb-Cholecalciferol (CALCIUM + D3) 600-200 MG-UNIT TABS Take 1 tablet by mouth daily.      . cholecalciferol (VITAMIN D) 1000 UNITS tablet Take 1,000 Units by mouth daily.      . doxepin (SINEQUAN) 10 MG capsule Take 10 mg by mouth at bedtime.      .   dutasteride (AVODART) 0.5 MG capsule Take 0.5 mg by mouth daily.      . Melatonin 5 MG CAPS Take 1 capsule by mouth daily.      . mirtazapine (REMERON) 7.5 MG tablet Take 7.5 mg by mouth at bedtime.      . omeprazole (PRILOSEC) 20 MG capsule Take 20 mg by mouth daily.      . oxycodone (ROXICODONE) 30  MG immediate release tablet Take 60 mg by mouth every 8 (eight) hours.      . polyethylene glycol (MIRALAX / GLYCOLAX) packet Take 17 g by mouth daily.      . sodium chloride (OCEAN) 0.65 % nasal spray Place 2 sprays into the nose 3 (three) times daily as needed. For dryness.      . Tamsulosin HCl (FLOMAX) 0.4 MG CAPS Take 0.4 mg by mouth daily.      . temazepam (RESTORIL) 30 MG capsule Take 30 mg by mouth at bedtime.        Home: Home Living Lives With: Alone Available Help at Discharge: Personal care attendant (for 4 hours in AM and 3 hours in PM.) Home Adaptive Equipment: Walker - rolling Additional Comments: has aides 7x week, 7 hours split   Functional History: Prior Function Comments: ambulated with RW  Functional Status:  Mobility: Bed Mobility Bed Mobility: Supine to Sit;Sitting - Scoot to Edge of Bed Supine to Sit: 3: Mod assist;HOB elevated;With rails Supine to Sit: Patient Percentage: 50% Sitting - Scoot to Edge of Bed: 3: Mod assist;With rail Transfers Transfers: Sit to Stand;Stand to Sit;Stand Pivot Transfers Sit to Stand: 3: Mod assist;From chair/3-in-1;With armrests;From bed;With upper extremity assist Sit to Stand: Patient Percentage: 50% Stand to Sit: 4: Min assist;With armrests;To chair/3-in-1;With upper extremity assist Stand Pivot Transfers: 3: Mod assist Stand Pivot Transfers: Patient Percentage: 60% Ambulation/Gait Ambulation/Gait Assistance: 1: +2 Total assist Ambulation/Gait: Patient Percentage: 60% Ambulation Distance (Feet): 5 Feet Assistive device: Rolling walker Ambulation/Gait Assistance Details: side steps to recliner, VC to completely turn to recliner prior to sitting. Gait Pattern: Step-to pattern;Trunk flexed    ADL: ADL Eating/Feeding: NPO (except ice chips) Grooming: Simulated;Set up Where Assessed - Grooming: Supported sitting Upper Body Bathing: Simulated;Set up Where Assessed - Upper Body Bathing: Supported sitting Lower Body  Bathing: Simulated;+2 Total assistance Where Assessed - Lower Body Bathing: Supported sit to stand Upper Body Dressing: Simulated;Minimal assistance (lines) Where Assessed - Upper Body Dressing: Supported sitting Lower Body Dressing: Simulated;+2 Total assistance Where Assessed - Lower Body Dressing: Supported sit to stand Toilet Transfer: Performed;+2 Total assistance Toilet Transfer Method: Stand pivot Toilet Transfer Equipment: Bedside commode Tub/Shower Transfer: +2 Total assistance Transfers/Ambulation Related to ADLs: After toileting. Pt took several side steps to bedside chair with +2 A. ADL Comments: Pt just extubated today.  Had intermittent help with adls  Cognition: Cognition Arousal/Alertness: Awake/alert Orientation Level: Oriented X4 Cognition Overall Cognitive Status: Appears within functional limits for tasks assessed/performed Arousal/Alertness: Awake/alert Orientation Level: Appears intact for tasks assessed Behavior During Session: WFL for tasks performed   Blood pressure 126/86, pulse 108, temperature 99.4 F (37.4 C), temperature source Oral, resp. rate 20, height 5' 9" (1.753 m), weight 63.4 kg (139 lb 12.4 oz), SpO2 92.00%. Physical Exam  Constitutional:  Frail elderly male  HENT:  Head: Normocephalic.  Eyes:  Pupils reactive to light  Neck: Neck supple. No thyromegaly present.  Cardiovascular:  Cardiac rate is controlled. No murmur or rub Pulmonary/Chest:  Decreased breath sounds at the bases . No rales.   No distress Abdominal: Soft. Bowel sounds are normal. He exhibits no distension.  Musculoskeletal: He exhibits no edema.  Neurological: He is alert.  Patient was able to name person place and date of birth. He did need some cues for overall awareness. Fair insight. A little impulsive, attention can wax and wane. He followed basic commands  Skin: Skin is warm and dry.  Motor strength 4 minus/5 in bilateral deltoid, biceps, and triceps, grip  4  minus/5 in bilateral hip flexors knee extensors ankle dorsiflexors and plantar flexors  Sensation intact to light touch in both upper and lower limbs. DTr's 1+.   Results for orders placed during the hospital encounter of 08/08/12 (from the past 48 hour(s))  CBC     Status: Abnormal   Collection Time   08/13/12  3:53 AM      Component Value Range Comment   WBC 5.6  4.0 - 10.5 K/uL    RBC 2.62 (*) 4.22 - 5.81 MIL/uL    Hemoglobin 9.3 (*) 13.0 - 17.0 g/dL    HCT 28.1 (*) 39.0 - 52.0 %    MCV 107.3 (*) 78.0 - 100.0 fL    MCH 35.5 (*) 26.0 - 34.0 pg    MCHC 33.1  30.0 - 36.0 g/dL    RDW 14.4  11.5 - 15.5 %    Platelets 307  150 - 400 K/uL   BASIC METABOLIC PANEL     Status: Abnormal   Collection Time   08/13/12  3:53 AM      Component Value Range Comment   Sodium 143  135 - 145 mEq/L    Potassium 4.4  3.5 - 5.1 mEq/L    Chloride 107  96 - 112 mEq/L    CO2 29  19 - 32 mEq/L    Glucose, Bld 103 (*) 70 - 99 mg/dL    BUN 21  6 - 23 mg/dL    Creatinine, Ser 0.82  0.50 - 1.35 mg/dL    Calcium 8.7  8.4 - 10.5 mg/dL    GFR calc non Af Amer 77 (*) >90 mL/min    GFR calc Af Amer 89 (*) >90 mL/min   MAGNESIUM     Status: Normal   Collection Time   08/13/12  3:53 AM      Component Value Range Comment   Magnesium 1.8  1.5 - 2.5 mg/dL    No results found.  Post Admission Physician Evaluation: 1. Functional deficits secondary  to deconditioning after respiratory and cardiac arrest. 2. Patient is admitted to receive collaborative, interdisciplinary care between the physiatrist, rehab nursing staff, and therapy team. 3. Patient's level of medical complexity and substantial therapy needs in context of that medical necessity cannot be provided at a lesser intensity of care such as a SNF. 4. Patient has experienced substantial functional loss from his/her baseline which was documented above under the "Functional History" and "Functional Status" headings.  Judging by the patient's diagnosis,  physical exam, and functional history, the patient has potential for functional progress which will result in measurable gains while on inpatient rehab.  These gains will be of substantial and practical use upon discharge  in facilitating mobility and self-care at the household level. 5. Physiatrist will provide 24 hour management of medical needs as well as oversight of the therapy plan/treatment and provide guidance as appropriate regarding the interaction of the two. 6. 24 hour rehab nursing will assist with bladder management, bowel management, safety, skin/wound care, disease management, medication administration,   pain management and patient education  and help integrate therapy concepts, techniques,education, etc. 7. PT will assess and treat for:  fxnl mobility, strength, endurance, safety.  Goals are: supervision. 8. OT will assess and treat for: ues, adl's, fxnl mobility, stamina, adaptive techniques and equpiment.   Goals are: supervision to min assist . 9. SLP will assess and treat for: n/a.  Goals are: n/a. 10. Case Management and Social Worker will assess and treat for psychological issues and discharge planning. 11. Team conference will be held weekly to assess progress toward goals and to determine barriers to discharge. 12. Patient will receive at least 3 hours of therapy per day at least 5 days per week. 13. ELOS: 2 weeks      Prognosis:  good   Medical Problem List and Plan: 1. Deconditioning/pneumonia/respiratory failure and cardiac arrest 2. DVT Prophylaxis/Anticoagulation: Subcutaneous Lovenox. Monitor platelet counts and any signs of bleeding 3. Pain Management/chronic back pain: Oxycodone as needed. Monitor mental status as well as monitor with increased mobility 4. Mood: Remeron 7.5 mg each bedtime. Provide emotional support and positive reinforcement 5. Neuropsych: This patient is capable of making decisions on his/her own behalf. 6. ID. 10 day course of Avelox 7.  Dysphagia. Diet upgraded today. Watch closely for tolerance 8. Hypertension/ A. fib. Amiodarone 200 mg daily. Cardiac rate controlled and monitored 9. COPD. Continue nebulizers as advised. Monitor closely in light of pneumonia and hospital course respiratory failure. Check oxygen saturations every shift. Recent chest x-ray 08/11/2012 showing improvement right upper lobe aeration. 10. CHF. Monitor for any signs of fluid overload 08/14/2012, 10:10 AM   

## 2012-08-14 NOTE — Progress Notes (Signed)
Brief Nutrition Note  Reason: Calorie count in progress.   No information obtained for calorie count to calculate. Will continue calorie count until 11/3. Re-hug calorie count envelope on pt door.   [name] [facility] [phone]

## 2012-08-14 NOTE — Clinical Social Work Placement (Signed)
Clinical Social Work Department CLINICAL SOCIAL WORK PLACEMENT NOTE 08/14/2012  Patient:  [name], [name]  Account Number:  [id number] Admit date:  08/08/2012  Clinical Social Worker:  Jodelle Red  Date/time:  08/12/2012 12:05 PM  Clinical Social Work is seeking post-discharge placement for this patient at the following level of care:   SKILLED NURSING   (*CSW will update this form in Epic as items are completed)   08/12/2012  Patient/family provided with [facility] Department of Clinical Social Work's list of facilities offering this level of care within the geographic area requested by the patient (or if unable, by the patient's family).  08/12/2012  Patient/family informed of their freedom to choose among providers that offer the needed level of care, that participate in Medicare, Medicaid or managed care program needed by the patient, have an available bed and are willing to accept the patient.  08/12/2012  Patient/family informed of [facility]' ownership interest in [facility], as well as of the fact that they are under no obligation to receive care at this facility.  PASARR submitted to [employer] on  PASARR number received from [employer] on 07/04/2009  FL2 transmitted to all facilities in geographic area requested by pt/family on  08/12/2012 FL2 transmitted to all facilities within larger geographic area on   Patient informed that his/her managed care company has contracts with or will negotiate with  certain facilities, including the following:     Patient/family informed of bed offers received:  08/13/2012 Patient chooses bed at  Physician recommends and patient chooses bed at    Patient to be transferred to  on   Patient to be transferred to facility by   The following physician request were entered in Epic:   Additional Comments: Pt prefers CIR, but has SNF offers as a back up.  Doreen Salvage, LCSW ICU/Stepdown Clinical Social Worker [facility] Cell [phone] Hours 8am-1200pm M-F

## 2012-08-14 NOTE — Plan of Care (Signed)
Overall Plan of Care [facility]) Patient Details Name: [name] MRN: [id number] DOB: 08-Jan-1922  Diagnosis:  deconditioning  Primary Diagnosis:    <principal problem not specified> Co-morbidities: htn, afib, chf  Functional Problem List  Patient demonstrates impairments in the following areas: Balance, Bladder, Endurance, Medication Management, Motor, Pain and Safety  Basic ADL's: grooming, bathing, dressing and toileting   Transfers:  bed mobility, bed to chair, toilet, tub/shower, car and furniture Locomotion:  ambulation and stairs  Additional Impairments:  Swallowing  Anticipated Outcomes Item Anticipated Outcome  Eating/Swallowing  Min A  Basic self-care  UB dressing: supervision, LB dressing: Max A, Bathing: Min A  Tolieting  Mod I  Bowel/Bladder  Continent bowel/bladder with timed toileting, only prn meds for bowel  Transfers  Mod I: bed<>chair, toilet. Supervision: tub/shower   Locomotion  supervision  Communication  At baseline  Cognition  At baseline   Pain  3 or less 1-10 scale  Safety/Judgment  supervision  Other  Skin: no breakdown min assist   Therapy Plan:   OT Frequency: 1-2 X/day, 60-90 minutes SLP Frequency: 1-2 X/day, 30-60 minutes   Team Interventions: Item RN PT OT SLP SW TR Other  Self Care/Advanced ADL Retraining   x      Neuromuscular Re-Education  x       Therapeutic Activities  x x x     UE/LE Strength Training/ROM  x x      UE/LE Coordination Activities  x x      Visual/Perceptual Remediation/Compensation         DME/Adaptive Equipment Instruction  x x      Therapeutic Exercise  x x x     Balance/Vestibular Training  x x      Patient/Family Education x x x x     Cognitive Remediation/Compensation         Functional Mobility Training  x x      Ambulation/Gait Training  x       Museum/gallery curator  x       Wheelchair Propulsion/Positioning  x       Functional Tourist information centre manager Reintegration  x x        Dysphagia/Aspiration Printmaker    x     Speech/Language Facilitation         Bladder Management x        Bowel Management x        Disease Management/Prevention x        Pain Management x x       Medication Management x        Skin Care/Wound Management x        Splinting/Orthotics         Discharge Planning  x x x     Psychosocial Support    x                        Team Discharge Planning: Destination:  Home Projected Follow-up:  PT, SLP and Home Health Projected Equipment Needs:  Information systems manager, walker Patient/family involved in discharge planning:  Yes  MD ELOS: 7-10 days Medical Rehab Prognosis:  Good Assessment: pt admitted for CIR therapies. The team will be addressing swallowing, stamina, balance, nutrition, safety, fxnl moboility, ADL's with goals essnetially mod I to supervision except for swallowing which is min assist  This patient has some baseline cognitive deficits

## 2012-08-14 NOTE — PMR Pre-admission (Signed)
PMR Admission Coordinator Pre-Admission Assessment  Patient: [name] is an 78 y.o., male MRN: [id number] DOB: 1922-09-26 Height: 5\' 9"  (175.3 cm) Weight: 63.4 kg (139 lb 12.4 oz)  Insurance Information HMO:      PPO:       PCP:       IPA:       80/20:       OTHER:   PRIMARY: Medicare A/B      Policy#: [id number]      Subscriber: Vernia Buff CM Name:        Phone#:       Fax#:   Pre-Cert#:        Employer: Retired Benefits:  Phone #:       Name: Armed forces training and education officer. Date: 01/12/89=A 02/11/89=B     Deduct: $1184      Out of Pocket Max: None      Life Max: unlimited CIR: 100%      SNF: 100 days   LBD = 04/12/12 (used 3 SNF days in June) Outpatient: 80%     Co-Pay: 20% Home Health: 100%      Co-Pay: none DME: 80%     Co-Pay: 20% Providers: patient's choice  SECONDARY: AARP      Policy#: [id number]      Subscriber: Vernia Buff CM Name:        Phone#:       Fax#:   Pre-Cert#:        Employer: Retired Benefits:  Phone #:  [phone]     Name:   Eff. Date:       Deduct:        Out of Pocket Max:        Life Max:   CIR:        SNF:   Outpatient:       Co-Pay:   Home Health:        Co-Pay:   DME:       Co-Pay:    Emergency Contact Information Contact Information    Name Relation Home Work Mobile   [place] Daughter [phone]  [phone]   Dia, Jefferys   [phone]     Current Medical History  Patient Admitting Diagnosis: Deconditioning From pneumonia with subsequent respiratory failure and cardiac arrest   History of Present Illness: A 78 y.o. right-handed male with history of atrial fibrillation not a Coumadin candidate secondary to risk of falls, syncope and COPD and recent hospitalization June 2013 for community-acquired pneumonia and CHF exacerbation. Admitted 08/08/2012 with 3 day history of worsening shortness of breath, productive cough, sore throat and wheezing and fever of 100.5. Chest x-ray showed patchy bibasilar infiltrates or atelectasis left greater than  right. Placed on broad-spectrum antibiotics. Noted on the evening of 08/08/2012 patient became more agitated increasing shortness of breath then became unresponsive and found to be pulseless chest compressions were started he was found to be in asystole received 2 rounds of epi and one amp of bicarbonate with down time of 9 minutes by code sheet. Critical care followup patient remained on ventilator support. Patient was later extubated 08/11/2012 and monitored. Maintained on subcutaneous Lovenox for DVT prophylaxis. Followup cardiology services([facility]) for history of atrial fibrillation and maintained on amiodarone as well as aspirin therapy. Followup speech therapy for questionable dysphagia currently on full liquid diet and monitored for any signs of aspiration. Physical and occupational therapy evaluations completed 08/11/2012 on going with recommendations of physical medicine rehabilitation consult to consider inpatient rehabilitation services secondary  to deconditioning.   Past Medical History  Past Medical History  Diagnosis Date  . Atrial fibrillation with slow ventricular response     Symptoms of weakness  . Paroxysmal atrial fibrillation     Hx of, currently on amiodarone load  . Syncope     Probably related to sinus pause, finally conversion from atrial fibrillation to sinus rhythm  . COPD (chronic obstructive pulmonary disease)   . Hypertension   . Abnormal CXR 07/06/2007; 06/15/2010    Eleveted R HD 07/06/2007 > no change 09/92/2011  . Chronic back pain   . 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  There is no history of Atopy.  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  Has been to [facility] twice.  Had outpatient therapy after TKR 2 yrs ago.  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   Current  Medications  Current facility-administered medications:0.9 %  sodium chloride infusion, 250 mL, Intravenous, PRN, Rodolph Bong, MD;  acetaminophen (TYLENOL) tablet 650 mg, 650 mg, Oral, Q4H PRN, Rodolph Bong, MD;  amiodarone (PACERONE) tablet 200 mg, 200 mg, Oral, Daily, Rodolph Bong, MD, 200 mg at 08/14/12 1031;  arformoterol (BROVANA) nebulizer solution 15 mcg, 15 mcg, Nebulization, BID, Lupita Leash, MD, 15 mcg at 08/14/12 0865 aspirin chewable tablet 81 mg, 81 mg, Per Tube, Daily, Oretha Milch, MD, 81 mg at 08/14/12 1030;  budesonide (PULMICORT) nebulizer solution 0.25 mg, 0.25 mg, Nebulization, BID, Lupita Leash, MD, 0.25 mg at 08/14/12 0904;  calcium-vitamin D (OSCAL WITH D) 500-200 MG-UNIT per tablet 1 tablet, 1 tablet, Oral, Daily, Simonne Martinet, NP, 1 tablet at 08/14/12 1030 cholecalciferol (VITAMIN D) tablet 1,000 Units, 1,000 Units, Oral, Daily, Simonne Martinet, NP, 1,000 Units at 08/14/12 1031;  dextrose 5 %-0.9 % sodium chloride infusion, , Intravenous, Continuous, Simonne Martinet, NP, Last Rate: 20 mL/hr at 08/12/12 0841, 20 mL/hr at 08/12/12 0841;  enoxaparin (LOVENOX) injection 40 mg, 40 mg, Subcutaneous, Q24H, Domenick Roma, MD, 40 mg at 08/14/12 1033 feeding supplement (ENSURE COMPLETE) liquid 237 mL, 237 mL, Oral, TID BM, Jeoffrey Massed, RD, 237 mL at 08/13/12 2059;  feeding supplement (RESOURCE BREEZE) liquid 1 Container, 1 Container, Oral, BID BM, Jeoffrey Massed, RD, 1 Container at 08/14/12 1032;  levalbuterol (XOPENEX) nebulizer solution 0.63 mg, 0.63 mg, Nebulization, Q4H PRN, Rodolph Bong, MD mirtazapine (REMERON) tablet 7.5 mg, 7.5 mg, Oral, QHS, Oretha Milch, MD, 7.5 mg at 08/13/12 2157;  moxifloxacin (AVELOX) tablet 400 mg, 400 mg, Oral, Q2000, Simonne Martinet, NP, 400 mg at 08/13/12 2059;  ondansetron (ZOFRAN) injection 4 mg, 4 mg, Intravenous, Q6H PRN, Rodolph Bong, MD;  oxyCODONE (Oxy IR/ROXICODONE) immediate release tablet 5 mg, 5 mg, Oral,  Q4H PRN, Lupita Leash, MD, 5 mg at 08/14/12 1052 pantoprazole sodium (PROTONIX) 40 mg/20 mL oral suspension 40 mg, 40 mg, Oral, Q1200, Simonne Martinet, NP, 40 mg at 08/14/12 1144;  sodium chloride 0.9 % injection 3 mL, 3 mL, Intravenous, Q12H, Rodolph Bong, MD, 3 mL at 08/14/12 1033;  sodium chloride 0.9 % injection 3 mL, 3 mL, Intravenous, PRN, Rodolph Bong, MD;  Tamsulosin HCl [facility]) capsule 0.4 mg, 0.4 mg, Oral, Daily, Simonne Martinet, NP, 0.4 mg at  08/14/12 1031 temazepam (RESTORIL) capsule 30 mg, 30 mg, Oral, QHS PRN, Storm Frisk, MD, 30 mg at 08/12/12 0000;  DISCONTD: antiseptic oral rinse (BIOTENE) solution 15 mL, 15 mL, Mouth Rinse, QID, Domenick Roma, MD, 15 mL at 08/13/12 1600;  DISCONTD: chlorhexidine (PERIDEX) 0.12 % solution 15 mL, 15 mL, Mouth Rinse, BID, Domenick Roma, MD, 15 mL at 08/12/12 2016  Patients Current Diet: Full Liquid  Precautions / Restrictions Precautions Precautions: Fall Precaution Comments: aspiration Restrictions Weight Bearing Restrictions: No   Prior Activity Level Limited Community (1-2x/wk): Went out 2 X a week 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, Chief of Staff / Equipment Home Assistive Devices/Equipment: Environmental consultant (specify type) Home Adaptive Equipment: Walker - rolling  Prior Functional Level Prior Function Level of Independence: Needs assistance Needs Assistance: Light Housekeeping Comments: ambulated with RW  Current Functional Level Cognition  Arousal/Alertness: Awake/alert Overall Cognitive Status: Appears within functional limits for tasks assessed/performed Orientation Level: Oriented X4    Extremity Assessment (includes Sensation/Coordination)  RUE ROM/Strength/Tone: Within functional levels  RLE ROM/Strength/Tone: WFL for tasks assessed RLE Sensation: WFL - Light Touch    ADLs  Eating/Feeding: NPO (except ice chips) Grooming: Simulated;Set up Where Assessed - Grooming: Supported sitting Upper Body  Bathing: Simulated;Set up Where Assessed - Upper Body Bathing: Supported sitting Lower Body Bathing: Simulated;+2 Total assistance Lower Body Bathing: Patient Percentage: 50% Where Assessed - Lower Body Bathing: Supported sit to stand Upper Body Dressing: Simulated;Minimal assistance (lines) Where Assessed - Upper Body Dressing: Supported sitting Lower Body Dressing: Simulated;+2 Total assistance Lower Body Dressing: Patient Percentage: 30% Where Assessed - Lower Body Dressing: Supported sit to stand Toilet Transfer: Performed;+2 Total assistance Toilet Transfer: Patient Percentage: 60% Statistician Method: Surveyor, minerals: Materials engineer and Hygiene: Performed;+2 Total assistance (assist needed for thoroughness w hygeine.) Toileting - Architect and Hygiene: Patient Percentage: 50% Where Assessed - Engineer, mining and Hygiene: Sit to stand from 3-in-1 or toilet Tub/Shower Transfer: +2 Total assistance Tub/Shower Transfer: Patient Percentage: 50% Transfers/Ambulation Related to ADLs: After toileting. Pt took several side steps to bedside chair with +2 A. ADL Comments: Pt just extubated today.  Had intermittent help with adls    Mobility  Bed Mobility: Supine to Sit;Sitting - Scoot to Edge of Bed Supine to Sit: 4: Min assist;HOB elevated;With rails Supine to Sit: Patient Percentage: 50% Sitting - Scoot to Edge of Bed: 4: Min assist;4: Min guard    Transfers  Transfers: Sit to Stand;Stand to Sit Sit to Stand: 4: Min assist;From bed ( repeated times two for activity tol) Sit to Stand: Patient Percentage: 50% Stand to Sit: 4: Min assist;With armrests;To chair/3-in-1;With upper extremity assist Stand Pivot Transfers: 3: Mod assist Stand Pivot Transfers: Patient Percentage: 60%    Ambulation / Gait / Stairs / Wheelchair Mobility  Ambulation/Gait Ambulation/Gait Assistance: 4: Min  assist Ambulation/Gait: Patient Percentage: 60% Ambulation Distance (Feet): 45 Feet Assistive device: Rolling walker Ambulation/Gait Assistance Details: multi-modal cues for posture and breathing Gait Pattern: Step-to pattern;Step-through pattern;Trunk flexed    Posture / Balance Static Sitting Balance Static Sitting - Balance Support: Feet supported Static Sitting - Level of Assistance: 4: Min assist Static Sitting - Comment/# of Minutes: tends to lean backward when talking while sitting on edge of bed.     Previous Home Environment Living Arrangements: Children Lives With: Alone Available Help at Discharge: Personal care attendant (for 4 hours in AM and 3 hours in PM.) Home Care Services: Yes  Type of Home Care Services: Homehealth aide Home Care Agency (if known): [facility] Additional Comments: has aides 7x week, 7 hours split  Discharge Living Setting Plans for Discharge Living Setting: Patient's home;Alone;House (Lives alone, but CNA stays 7 hrs a day.) Type of Home at Discharge: House Discharge Home Layout: Multi-level;1/2 bath on main level (Sleeps in Lazy Boy recliner on main level, 1/2 bath there.) Alternate Level Stairs-Number of Steps: 12 (12 steps to upstairs BR/BR.) Discharge Home Access: Stairs to enter Entrance Stairs-Number of Steps: 3 Do you have any problems obtaining your medications?: No  Social/Family/Support Systems Patient Roles: Parent Contact Information: Oscar La - Dtr (c) [phone] Anticipated Caregiver: Langley Adie, CNA - [phone] Ability/Limitations of Caregiver: Has CNA currently working 7 hours a day Caregiver Availability: Other (Comment) (Can increase caregiver support as needed.) Discharge Plan Discussed with Primary Caregiver: Yes Is Caregiver In Agreement with Plan?: Yes Does Caregiver/Family have Issues with Lodging/Transportation while Pt is in Rehab?: No  Goals/Additional Needs Patient/Family Goal for Rehab: PT S, OT  S/Min A, ST to eval swallow   Expected length of stay: 7-10 days Cultural Considerations: None Dietary Needs: Full liquids, intermittent supervision for dysphagia  Equipment Needs: TBD Pt/Family Agrees to Admission and willing to participate: Yes Program Orientation Provided & Reviewed with Pt/Caregiver Including Roles  & Responsibilities: Yes  Patient Condition: This patient's condition remains as documented in the Consult dated 08/13/12, in which the Rehabilitation Physician determined and documented that the patient's condition is appropriate for intensive rehabilitative care in an inpatient rehabilitation facility.  Preadmission Screen Completed By:  Trish Mage, 08/14/2012 11:56 AM ______________________________________________________________________   Discussed status with Dr. Riley Kill on 08/14/12 at 1154 and received telephone approval for admission today.  Admission Coordinator:  Trish Mage, time1154/Date11/01/13

## 2012-08-14 NOTE — Progress Notes (Signed)
Patient was transferred to the [facility].with 2 x persons assist.Monitor tech notified the RN that the patient's HR increased to130. Patient was dyspneic with exertion.The patient was promptly assisted back to bed.The patient has remained asymptomatic since returning to bed.

## 2012-08-14 NOTE — Progress Notes (Signed)
Speech Language Pathology Dysphagia Treatment Patient Details Name: [name] MRN: [id number] DOB: [birth year] Today's Date: 08/14/2012 Time: 0930-1010 SLP Time Calculation (min): 40 min  Assessment / Plan / Recommendation Clinical Impression  SLP visit to assess tolerance of po diet, education with family and home nurse assistant regarding swallow precautions to maximize airway protection. Pt observed eating a few bites of yogurt and drinking chocolate milk-initial cough noted after first liquid swallow - ?  movement of secretions.   No further episodes with approx 2 ounces of milk and 4 bites of yogurt- [facility] NP then arrived to see pt.  Pt takes small bites/sips independently, but required moderate cues to verbalize need to cease po if short of breathe or coughing.  Pt verbalized being told that he had having been previously diagnosed with "outpouching" in esophagus that likely contributes to his dysphagia and asp risk; SlP had reviewed said information Tuesday and Wednesday of this week.  SLP wrote down key findings from previous esophagram for pt to aid with recall.  Pt's home nurse assistant x3 months, Billey Gosling,  present and reports pt did not have problems swallowing at home prior to admission. Hopeful pt will continue to improve to premorbid level of dysphagia for which he can compensate.  Pt reports continued issues with coughing on secretions and SLP reiterated that a feeding tube will not prevent secretion aspiration for this pt.  Rec pt undergo an MBS at CIR to allow SLP to establish dysphagia exercise goals based on anatomical and physiological  changes.  MBS would also allow possible initiation of compensatory strategies to mitigate known esophageal issues- Pt agreeable.  Role of SLP to mitigate pt's dysphagia reviewed.         Diet Recommendation  Initiate / Change Diet: Thin liquid;Nectar-thick liquid (full liquids)    SLP Plan Continue with current plan of care   Pertinent Vitals/Pain  Low grade fever, decreased - pt reports difficulties with secretions, on room air   Swallowing Goals  SLP Swallowing Goals Swallow Study Goal #2 - Progress: Progressing toward goal  General Temperature Spikes Noted: Yes (low grade temps on and off since admit) Behavior/Cognition: Alert;Cooperative;Pleasant mood Oral Cavity - Dentition: Dentures, bottom;Missing dentition Patient Positioning: Upright in bed  Oral Cavity - Oral Hygiene Does patient have any of the following "at risk" factors?: Nutritional status - inadequate;Oxygen therapy - cannula, mask, simple oxygen devices Patient is AT RISK - Oral Care Protocol followed (see row info): Yes   Dysphagia Treatment Treatment focused on: Patient/family/caregiver education;Skilled observation of diet tolerance Family/Caregiver Educated: Charlie, home nurse tech in room with pt Treatment Methods/Modalities: Skilled observation Patient observed directly with PO's: Yes Type of PO's observed: Thin liquids (yogurt) Feeding: Able to feed self Liquids provided via: Straw Pharyngeal Phase Signs & Symptoms: Immediate cough (immediate cough after first thin bolus, ? moving secretion-)   GO    Donavan Burnet, MS [facility] SLP [phone]

## 2012-08-14 NOTE — Interval H&P Note (Signed)
[name] was admitted today to Inpatient Rehabilitation with the diagnosis of deconditioning after respiratory and cardiac arrest.  The patient's history has been reviewed, patient examined, and there is no change in status.  Patient continues to be appropriate for intensive inpatient rehabilitation.  I have reviewed the patient's chart and labs.  Questions were answered to the patient's satisfaction.  SWARTZ,ZACHARY T 08/14/2012, 8:50 PM

## 2012-08-14 NOTE — Progress Notes (Signed)
Patient admitted to 4007 at 60. Home caregiver with patient. Patient alert and orientedx4, rhonchi in all lobes, suction and O2 set up at bedside. Patient oriented to unit, call bell system, rehab schedule, and safety plan. Patient verbalized understanding. [name]

## 2012-08-14 NOTE — H&P (Signed)
Physical Medicine and Rehabilitation Admission H&P    Chief Complaint  Patient presents with  . Wheezing  . COPD  . Shortness of Breath  : HPI: [name] is a 78 y.o. right-handed male with history of atrial fibrillation not a Coumadin candidate secondary to risk of falls, syncope and COPD and recent hospitalization June 2013 for community-acquired pneumonia and CHF exacerbation. Admitted 08/08/2012 with 3 day history of worsening shortness of breath, productive cough, sore throat and wheezing and fever of 100.5. Chest x-ray showed patchy bibasilar infiltrates or atelectasis left greater than right. Placed on broad-spectrum antibiotics. Noted on the evening of 08/08/2012 patient became more agitated increasing shortness of breath then became unresponsive and found to be pulseless chest compressions were started he was found to be in asystole received 2 rounds of epi and one amp of bicarbonate with down time of 9 minutes by code sheet. Critical care followup patient remained on ventilator support. Patient was later extubated 08/11/2012 and monitored. Maintained on subcutaneous Lovenox for DVT prophylaxis. Followup cardiology services() for history of atrial fibrillation and maintained on amiodarone as well as aspirin therapy. Followup speech therapy for questionable dysphagia currently on full liquid diet and monitored for any signs of aspiration. Physical and occupational therapy evaluations completed 08/11/2012 on going with recommendations of physical medicine rehabilitation consult to consider inpatient rehabilitation services secondary to deconditioning. Patient was felt to be a good candidate for inpatient rehabilitation services and was admitted for comprehensive rehabilitation program  Review of Systems  Constitutional: Positive for fever and chills.  HENT: Positive for hearing loss.  Respiratory: Positive for cough and sputum production.  Cardiovascular: Positive for palpitations.    Musculoskeletal: Positive for myalgias and falls. Chronic back pain  Neurological: Positive for weakness. Depression All other systems reviewed and are negative   Past Medical History  Diagnosis Date  . Atrial fibrillation with slow ventricular response     Symptoms of weakness  . Paroxysmal atrial fibrillation     Hx of, currently on amiodarone load  . Syncope     Probably related to sinus pause, finally conversion from atrial fibrillation to sinus rhythm  . COPD (chronic obstructive pulmonary disease)   . Hypertension   . Abnormal CXR 07/06/2007; 06/15/2010    Eleveted R HD 07/06/2007 > no change 09/92/2011  . Chronic back pain   . 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 Joint replacement    Family History  Problem Relation Age of Onset  . Heart disease Brother     Atrial fibrillation  . Atopy Neg Hx    Social History:  reports that he quit smoking about 48 years ago. He does not have any smokeless tobacco history on file. He reports that he drinks about 3.5 ounces of alcohol per week. He reports that he does not use illicit drugs. Allergies: No Known Allergies Medications Prior to Admission  Medication Sig Dispense Refill  . amiodarone (PACERONE) 200 MG tablet Take 200 mg by mouth daily.      Marland Kitchen aspirin EC 81 MG tablet Take 81 mg by mouth daily.      . Calcium Carb-Cholecalciferol (CALCIUM + D3) 600-200 MG-UNIT TABS Take 1 tablet by mouth daily.      . cholecalciferol (VITAMIN D) 1000 UNITS tablet Take 1,000 Units by mouth daily.      Marland Kitchen doxepin (SINEQUAN) 10 MG capsule Take 10 mg by mouth at bedtime.      Marland Kitchen  dutasteride (AVODART) 0.5 MG capsule Take 0.5 mg by mouth daily.      . Melatonin 5 MG CAPS Take 1 capsule by mouth daily.      . mirtazapine (REMERON) 7.5 MG tablet Take 7.5 mg by mouth at bedtime.      Marland Kitchen omeprazole (PRILOSEC) 20 MG capsule Take 20 mg by mouth daily.      Marland Kitchen oxycodone (ROXICODONE) 30  MG immediate release tablet Take 60 mg by mouth every 8 (eight) hours.      . polyethylene glycol (MIRALAX / GLYCOLAX) packet Take 17 g by mouth daily.      . sodium chloride (OCEAN) 0.65 % nasal spray Place 2 sprays into the nose 3 (three) times daily as needed. For dryness.      . Tamsulosin HCl (FLOMAX) 0.4 MG CAPS Take 0.4 mg by mouth daily.      . temazepam (RESTORIL) 30 MG capsule Take 30 mg by mouth at bedtime.        Home: Home Living Lives With: Alone Available Help at Discharge: Personal care attendant (for 4 hours in AM and 3 hours in PM.) Home Adaptive Equipment: Walker - rolling Additional Comments: has aides 7x week, 7 hours split   Functional History: Prior Function Comments: ambulated with RW  Functional Status:  Mobility: Bed Mobility Bed Mobility: Supine to Sit;Sitting - Scoot to Edge of Bed Supine to Sit: 3: Mod assist;HOB elevated;With rails Supine to Sit: Patient Percentage: 50% Sitting - Scoot to Edge of Bed: 3: Mod assist;With rail Transfers Transfers: Sit to Stand;Stand to [employer] Transfers Sit to Stand: 3: Mod assist;From chair/3-in-1;With armrests;From bed;With upper extremity assist Sit to Stand: Patient Percentage: 50% Stand to Sit: 4: Min assist;With armrests;To chair/3-in-1;With upper extremity assist Stand Pivot Transfers: 3: Mod assist Stand Pivot Transfers: Patient Percentage: 60% Ambulation/Gait Ambulation/Gait Assistance: 1: +2 Total assist Ambulation/Gait: Patient Percentage: 60% Ambulation Distance (Feet): 5 Feet Assistive device: Rolling walker Ambulation/Gait Assistance Details: side steps to recliner, VC to completely turn to recliner prior to sitting. Gait Pattern: Step-to pattern;Trunk flexed    ADL: ADL Eating/Feeding: NPO (except ice chips) Grooming: Simulated;Set up Where Assessed - Grooming: Supported sitting Upper Body Bathing: Simulated;Set up Where Assessed - Upper Body Bathing: Supported sitting Lower Body  Bathing: Simulated;+2 Total assistance Where Assessed - Lower Body Bathing: Supported sit to stand Upper Body Dressing: Simulated;Minimal assistance (lines) Where Assessed - Upper Body Dressing: Supported sitting Lower Body Dressing: Simulated;+2 Total assistance Where Assessed - Lower Body Dressing: Supported sit to Pharmacist, hospital: Performed;+2 Total assistance Toilet Transfer Method: Surveyor, minerals: Animator Transfer: +2 Total assistance Transfers/Ambulation Related to ADLs: After toileting. Pt took several side steps to bedside chair with +2 A. ADL Comments: Pt just extubated today.  Had intermittent help with adls  Cognition: Cognition Arousal/Alertness: Awake/alert Orientation Level: Oriented X4 Cognition Overall Cognitive Status: Appears within functional limits for tasks assessed/performed Arousal/Alertness: Awake/alert Orientation Level: Appears intact for tasks assessed Behavior During Session: [facility] for tasks performed   Blood pressure 126/86, pulse 108, temperature 99.4 F (37.4 C), temperature source Oral, resp. rate 20, height 5\' 9"  (1.753 m), weight 63.4 kg (139 lb 12.4 oz), SpO2 92.00%. Physical Exam  Constitutional:  Frail elderly male  HENT:  Head: Normocephalic.  Eyes:  Pupils reactive to light  Neck: Neck supple. No thyromegaly present.  Cardiovascular:  Cardiac rate is controlled. No murmur or rub Pulmonary/Chest:  Decreased breath sounds at the bases . No rales.  No distress Abdominal: Soft. Bowel sounds are normal. He exhibits no distension.  Musculoskeletal: He exhibits no edema.  Neurological: He is alert.  Patient was able to name person place and date of birth. He did need some cues for overall awareness. Fair insight. A little impulsive, attention can wax and wane. He followed basic commands  Skin: Skin is warm and dry.  Motor strength 4 minus/5 in bilateral deltoid, biceps, and triceps, grip  4  minus/5 in bilateral hip flexors knee extensors ankle dorsiflexors and plantar flexors  Sensation intact to light touch in both upper and lower limbs. DTr's 1+.   Results for orders placed during the hospital encounter of 08/08/12 (from the past 48 hour(s))  CBC     Status: Abnormal   Collection Time   08/13/12  3:53 AM      Component Value Range Comment   WBC 5.6  4.0 - 10.5 K/uL    RBC 2.62 (*) 4.22 - 5.81 MIL/uL    Hemoglobin 9.3 (*) 13.0 - 17.0 g/dL    HCT 78.2 (*) 95.6 - 52.0 %    MCV 107.3 (*) 78.0 - 100.0 fL    MCH 35.5 (*) 26.0 - 34.0 pg    MCHC 33.1  30.0 - 36.0 g/dL    RDW 21.3  08.6 - 57.8 %    Platelets 307  150 - 400 K/uL   BASIC METABOLIC PANEL     Status: Abnormal   Collection Time   08/13/12  3:53 AM      Component Value Range Comment   Sodium 143  135 - 145 mEq/L    Potassium 4.4  3.5 - 5.1 mEq/L    Chloride 107  96 - 112 mEq/L    CO2 29  19 - 32 mEq/L    Glucose, Bld 103 (*) 70 - 99 mg/dL    BUN 21  6 - 23 mg/dL    Creatinine, Ser 4.69  0.50 - 1.35 mg/dL    Calcium 8.7  8.4 - 62.9 mg/dL    GFR calc non Af Amer 77 (*) >90 mL/min    GFR calc Af Amer 89 (*) >90 mL/min   MAGNESIUM     Status: Normal   Collection Time   08/13/12  3:53 AM      Component Value Range Comment   Magnesium 1.8  1.5 - 2.5 mg/dL    No results found.  Post Admission Physician Evaluation: 1. Functional deficits secondary  to deconditioning after respiratory and cardiac arrest. 2. Patient is admitted to receive collaborative, interdisciplinary care between the physiatrist, rehab nursing staff, and therapy team. 3. Patient's level of medical complexity and substantial therapy needs in context of that medical necessity cannot be provided at a lesser intensity of care such as a SNF. 4. Patient has experienced substantial functional loss from his/her baseline which was documented above under the "Functional History" and "Functional Status" headings.  Judging by the patient's diagnosis,  physical exam, and functional history, the patient has potential for functional progress which will result in measurable gains while on inpatient rehab.  These gains will be of substantial and practical use upon discharge  in facilitating mobility and self-care at the household level. 5. Physiatrist will provide 24 hour management of medical needs as well as oversight of the therapy plan/treatment and provide guidance as appropriate regarding the interaction of the two. 6. 24 hour rehab nursing will assist with bladder management, bowel management, safety, skin/wound care, disease management, medication administration,  pain management and patient education  and help integrate therapy concepts, techniques,education, etc. 7. PT will assess and treat for:  fxnl mobility, strength, endurance, safety.  Goals are: supervision. 8. OT will assess and treat for: ues, adl's, fxnl mobility, stamina, adaptive techniques and equpiment.   Goals are: supervision to min assist . 9. SLP will assess and treat for: n/a.  Goals are: n/a. 10. Case Management and Social Worker will assess and treat for psychological issues and discharge planning. 11. Team conference will be held weekly to assess progress toward goals and to determine barriers to discharge. 12. Patient will receive at least 3 hours of therapy per day at least 5 days per week. 13. ELOS: 2 weeks      Prognosis:  good   Medical Problem List and Plan: 1. Deconditioning/pneumonia/respiratory failure and cardiac arrest 2. DVT Prophylaxis/Anticoagulation: Subcutaneous Lovenox. Monitor platelet counts and any signs of bleeding 3. Pain Management/chronic back pain: Oxycodone as needed. Monitor mental status as well as monitor with increased mobility 4. Mood: Remeron 7.5 mg each bedtime. Provide emotional support and positive reinforcement 5. Neuropsych: This patient is capable of making decisions on his/her own behalf. 6. ID. 10 day course of Avelox 7.  Dysphagia. Diet upgraded today. Watch closely for tolerance 8. Hypertension/ A. fib. Amiodarone 200 mg daily. Cardiac rate controlled and monitored 9. COPD. Continue nebulizers as advised. Monitor closely in light of pneumonia and hospital course respiratory failure. Check oxygen saturations every shift. Recent chest x-ray 08/11/2012 showing improvement right upper lobe aeration. 10. CHF. Monitor for any signs of fluid overload 08/14/2012, 10:10 AM

## 2012-08-14 NOTE — Progress Notes (Signed)
Physical Therapy Treatment Patient Details Name: [name] MRN: [id number] DOB: Feb 27, 1922 Today's Date: 08/14/2012 Time: 4098-1191 PT Time Calculation (min): 28 min  PT Assessment / Plan / Recommendation Comments on Treatment Session  Plan is for CIR, pt motivated and making good progress thus far with therapy;    Follow Up Recommendations  Post acute inpatient     Does the patient have the potential to tolerate intense rehabilitation     Barriers to Discharge        Equipment Recommendations  None recommended by PT;None recommended by OT    Recommendations for Other Services    Frequency Min 3X/week   Plan Discharge plan remains appropriate;Frequency remains appropriate    Precautions / Restrictions Precautions Precautions: Fall Precaution Comments: aspiration Restrictions Weight Bearing Restrictions: No   Pertinent Vitals/Pain Sats 89-93% on RA, after amb, sats improved with pursed lip breathing; HR <115    Mobility  Bed Mobility Bed Mobility: Supine to Sit;Sitting - Scoot to Edge of Bed Supine to Sit: 4: Min assist;HOB elevated;With rails Sitting - Scoot to Edge of Bed: 4: Min assist;4: Min guard Details for Bed Mobility Assistance: Pt takes extra time. has back pain. Transfers Transfers: Sit to Stand;Stand to Sit Sit to Stand: 4: Min assist;From bed ( repeated times two for activity tol) Stand to Sit: 4: Min assist;With armrests;To chair/3-in-1;With upper extremity assist Details for Transfer Assistance: verbal cues for hand placement and assist for anterior wt shift to come to stand and control descent Ambulation/Gait Ambulation/Gait Assistance: 4: Min assist Ambulation Distance (Feet): 45 Feet Assistive device: Rolling walker Ambulation/Gait Assistance Details: multi-modal cues for posture and breathing Gait Pattern: Step-to pattern;Step-through pattern;Trunk flexed    Exercises Total Joint Exercises Ankle Circles/Pumps: AROM;10 reps;Both Quad Sets:  AROM;Both;10 reps   PT Diagnosis:    PT Problem List:   PT Treatment Interventions:     PT Goals Acute Rehab PT Goals Time For Goal Achievement: 08/25/12 Potential to Achieve Goals: Good Pt will go Supine/Side to Sit: with supervision PT Goal: Supine/Side to Sit - Progress: Progressing toward goal Pt will go Sit to Stand: with supervision PT Goal: Sit to Stand - Progress: Progressing toward goal Pt will go Stand to Sit: with supervision PT Goal: Stand to Sit - Progress: Progressing toward goal Pt will Ambulate: 51 - 150 feet;with supervision;with rolling walker PT Goal: Ambulate - Progress: Progressing toward goal  Visit Information  Last PT Received On: 08/14/12 Assistance Needed: +1    Subjective Data  Subjective: I am going to rehab   Cognition  Overall Cognitive Status: Appears within functional limits for tasks assessed/performed Arousal/Alertness: Awake/alert Orientation Level: Appears intact for tasks assessed Behavior During Session: [facility] for tasks performed    Balance     End of Session PT - End of Session Equipment Utilized During Treatment: Gait belt Activity Tolerance: Patient tolerated treatment well Patient left: in chair;with call bell/phone within reach;with family/visitor present Nurse Communication: Mobility status;Patient requests pain meds (RN present with meds)   GP     [facility] 08/14/2012, 11:52 AM

## 2012-08-15 ENCOUNTER — Inpatient Hospital Stay (HOSPITAL_COMMUNITY): Payer: Medicare Other

## 2012-08-15 ENCOUNTER — Inpatient Hospital Stay (HOSPITAL_COMMUNITY): Payer: Medicare Other | Admitting: Speech Pathology

## 2012-08-15 ENCOUNTER — Inpatient Hospital Stay (HOSPITAL_COMMUNITY): Payer: Medicare Other | Admitting: Physical Therapy

## 2012-08-15 DIAGNOSIS — R5381 Other malaise: Secondary | ICD-10-CM

## 2012-08-15 DIAGNOSIS — R1310 Dysphagia, unspecified: Secondary | ICD-10-CM

## 2012-08-15 DIAGNOSIS — J159 Unspecified bacterial pneumonia: Secondary | ICD-10-CM

## 2012-08-15 MED ORDER — LIDOCAINE 5 % EX PTCH
1.0000 | MEDICATED_PATCH | CUTANEOUS | Status: DC
  Administered 2012-08-15 – 2012-08-20 (×6): 1 via TRANSDERMAL
  Filled 2012-08-15 (×8): qty 1

## 2012-08-15 MED ORDER — TRAZODONE HCL 50 MG PO TABS
50.0000 mg | ORAL_TABLET | Freq: Every day | ORAL | Status: DC
  Administered 2012-08-15 – 2012-08-21 (×7): 50 mg via ORAL
  Filled 2012-08-15 (×11): qty 1

## 2012-08-15 NOTE — Progress Notes (Signed)
Subjective/Complaints: Insomnia. Complains of right upper chest pain when he coughs. Breathing ok A 12 point review of systems has been performed and if not noted above is otherwise negative.   Objective: Vital Signs: Blood pressure 124/80, pulse 79, temperature 98 F (36.7 C), temperature source Oral, resp. rate 21, height 6' (1.829 m), weight 66.1 kg (145 lb 11.6 oz), SpO2 95.00%. No results found.  Basename 08/13/12 0353  WBC 5.6  HGB 9.3*  HCT 28.1*  PLT 307    Basename 08/13/12 0353  NA 143  K 4.4  CL 107  CO2 29  GLUCOSE 103*  BUN 21  CREATININE 0.82  CALCIUM 8.7   CBG (last 3)  No results found for this basename: GLUCAP:3 in the last 72 hours  Wt Readings from Last 3 Encounters:  08/15/12 66.1 kg (145 lb 11.6 oz)  08/12/12 63.4 kg (139 lb 12.4 oz)  05/08/12 63.504 kg (140 lb)    Physical Exam:  HENT:  Head: Normocephalic.  Eyes:  Pupils reactive to light  Neck: Neck supple. No thyromegaly present.  Cardiovascular:  Cardiac rate is controlled. No murmur or rub  Pulmonary/Chest:  Decreased breath sounds at the bases . Upper airway sounds heard. No distress .  RU chest wall is sore. Abdominal: Soft. Bowel sounds are normal. He exhibits no distension.  Musculoskeletal: He exhibits no edema.  Neurological: He is alert.  Patient was able to name person place and date of birth. He did need some cues for overall awareness. Fair insight. A little impulsive, attention can wax and wane. He followed basic commands  Skin: Skin is warm and dry.  Motor strength 4 minus/5 in bilateral deltoid, biceps, and triceps, grip  4 minus/5 in bilateral hip flexors knee extensors ankle dorsiflexors and plantar flexors  Sensation intact to light touch in both upper and lower limbs. DTr's 1+.   Assessment/Plan: 1. Functional deficits secondary to deconditioning after respiratory and cardiac arrest which require 3+ hours per day of interdisciplinary therapy in a comprehensive  inpatient rehab setting. Physiatrist is providing close team supervision and 24 hour management of active medical problems listed below. Physiatrist and rehab team continue to assess barriers to discharge/monitor patient progress toward functional and medical goals. FIM:                   Comprehension Comprehension Mode: Auditory Comprehension: 5-Follows basic conversation/direction: With extra time/assistive device  Expression Expression Mode: Verbal Expression: 5-Expresses basic needs/ideas: With extra time/assistive device  Social Interaction Social Interaction: 5-Interacts appropriately 90% of the time - Needs monitoring or encouragement for participation or interaction.  Problem Solving Problem Solving: 5-Solves basic problems: With no assist  Memory Memory: 5-Requires cues to use assistive device Medical Problem List and Plan:  1. Deconditioning/pneumonia/respiratory failure and cardiac arrest  2. DVT Prophylaxis/Anticoagulation: Subcutaneous Lovenox. Monitor platelet counts and any signs of bleeding  3. Pain Management/chronic back pain: Oxycodone as needed. Monitor mental status as well as monitor with increased mobility   -add lidoderm for chest wall pain 4. Mood: Remeron 7.5 mg each bedtime. Provide emotional support and positive reinforcement  5. Neuropsych: This patient is capable of making decisions on his/her own behalf.  6. ID. 10 day course of Avelox  7. Dysphagia.   Watch closely for tolerance  8. Hypertension/ A. fib. Amiodarone 200 mg daily. Cardiac rate controlled and monitored  9. COPD. Continue nebulizers as advised. Monitor closely in light of pneumonia and hospital course respiratory failure. Check oxygen saturations every  shift. Recent chest x-ray 08/11/2012 showing improvement right upper lobe aeration.  10. CHF. Monitor for any signs of fluid overload 11. imsomnia- trazodone      LOS (Days) 1 A FACE TO FACE EVALUATION WAS  PERFORMED  SWARTZ,ZACHARY T 08/15/2012, 8:11 AM

## 2012-08-15 NOTE — Evaluation (Signed)
Speech Language Pathology Assessment and Plan  Patient Details  Name: [name] MRN: [id number] Date of Birth: [birth year]  SLP Diagnosis: Dysphagia  Rehab Potential: Fair ELOS: 10 days   Today's Date: 08/15/2012 Time: 1030-1120 Time Calculation (min): 50 min  Skilled Therapeutic Intervention: Administered BSE, please see below for details. Also educated the pt and family about clinical reasoning for need of MBS and about MBS procedure. All verbalized understanding.   Problem List:  Patient Active Problem List  Diagnosis  . HYPERTENSION, BENIGN  . Atrial fibrillation  . DIASTOLIC HEART FAILURE, CHRONIC  . DYSPNEA  . CHEST PAIN UNSPECIFIED  . Nonspecific (abnormal) findings on radiological and other examination of body structure  . ABNORMAL LUNG XRAY  . Community acquired pneumonia  . Acute respiratory failure  . CHF (congestive heart failure)  . COPD (chronic obstructive pulmonary disease)  . Anemia  . Esophageal diverticulum  . Cardiac arrest   Past Medical History:  Past Medical History  Diagnosis Date  . Atrial fibrillation with slow ventricular response     Symptoms of weakness  . Paroxysmal atrial fibrillation     Hx of, currently on amiodarone load  . Syncope     Probably related to sinus pause, finally conversion from atrial fibrillation to sinus rhythm  . COPD (chronic obstructive pulmonary disease)   . Hypertension   . Abnormal CXR 07/06/2007; 06/15/2010    Eleveted R HD 07/06/2007 > no change 09/92/2011  . Chronic back pain   . 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 Joint replacement     Assessment / Plan / Recommendation Clinical Impression  Pt is an 78 y.o. right-handed male with history of atrial fibrillation not a Coumadin candidate secondary to risk of falls, syncope and COPD and recent hospitalization June 2013 for community-acquired pneumonia and CHF  exacerbation. Admitted 08/08/2012 with 3 day history of worsening shortness of breath, productive cough, sore throat and wheezing and fever of 100.5. Chest x-ray showed patchy bibasilar infiltrates or atelectasis left greater than right. Placed on broad-spectrum antibiotics. Noted on the evening of 08/08/2012 patient became more agitated increasing shortness of breath then became unresponsive and found to be pulseless chest compressions were started he was found to be in asystole received 2 rounds of epi and one amp of bicarbonate with down time of 9 minutes by code sheet. Critical care followup patient remained on ventilator support. Patient was later extubated 08/11/2012 and monitored. Maintained on subcutaneous Lovenox for DVT prophylaxis. Followup cardiology services([facility]) for history of atrial fibrillation and maintained on amiodarone as well as aspirin therapy. Pt transferred to CIR 08/14/12 and presents with a complex multifactorial dysphagia including suspected pharyngeal deficits, esophageal dysmotility, mid-esophagus diverticuli ( two right sided just below carina* ) per previous esophagrams. Pt administered thin liquids and trial of puree textures without clinical signs of penetration/aspiration, however, pt continues to report issues with coughing on secretions although pt did not demonstrate difficulty throughout BSE. Recommend pt continue with current full liquid diet but undergoes a MBSS to establish dysphagia exercise goals based on anatomical and physiological changes and allow possible initiation of compensatory strategies to mitigate known esophageal issues. Pt and family agreeable. Pt would benefit from skilled SLP intervention to maximize swallowing safety with least restrictive diet with increased utilization of swallowing compensatory strategies to mitigate dysphagia.     SLP Assessment  Patient will need skilled Speech  [place] Pathology Services during CIR admission      Recommendations  Follow up Recommendations: 24 hour supervision/assistance;Home Health SLP Equipment Recommended: None recommended by SLP    SLP Frequency 1-2 X/day, 30-60 minutes   SLP Treatment/Interventions Dysphagia/aspiration precaution training;Patient/family education;Therapeutic Exercise;Functional tasks;Internal/external aids    Pain Pain Assessment Pain Assessment: No/denies pain Pain Score: 0-No pain Prior Functioning    Short Term Goals: Week 1: SLP Short Term Goal 1 (Week 1): Patient will utilize recommended compensatory strategies to increase swallowing safety with full liquid diet with Min verbal cues.  SLP Short Term Goal 2 (Week 1): Pt will participate in MBS to establish dysphagia exercise goals and allow possible initiation of compensatory strategies to mitigate known esophageal issues.   See FIM for current functional status Refer to Care Plan for Long Term Goals  Recommendations for other services: None  Discharge Criteria: Patient will be discharged from SLP if patient refuses treatment 3 consecutive times without medical reason, if treatment goals not met, if there is a change in medical status, if patient makes no progress towards goals or if patient is discharged from hospital.  The above assessment, treatment plan, treatment alternatives and goals were discussed and mutually agreed upon: by patient and by family  Zakayla Martinec 08/15/2012, 1:40 PM

## 2012-08-15 NOTE — Evaluation (Signed)
Occupational Therapy Assessment and Plan  Patient Details  Name: [name] MRN: [id number] Date of Birth: January 25, 1922  OT Diagnosis: muscle weakness (generalized) Rehab Potential: Rehab Potential: Good ELOS:   10 days  Today's Date: 08/15/2012 Time: 0800-0900 Time Calculation (min): 60 min  Problem List:  Patient Active Problem List  Diagnosis  . HYPERTENSION, BENIGN  . Atrial fibrillation  . DIASTOLIC HEART FAILURE, CHRONIC  . DYSPNEA  . CHEST PAIN UNSPECIFIED  . Nonspecific (abnormal) findings on radiological and other examination of body structure  . ABNORMAL LUNG XRAY  . Community acquired pneumonia  . Acute respiratory failure  . CHF (congestive heart failure)  . COPD (chronic obstructive pulmonary disease)  . Anemia  . Esophageal diverticulum  . Cardiac arrest    Past Medical History:  Past Medical History  Diagnosis Date  . Atrial fibrillation with slow ventricular response     Symptoms of weakness  . Paroxysmal atrial fibrillation     Hx of, currently on amiodarone load  . Syncope     Probably related to sinus pause, finally conversion from atrial fibrillation to sinus rhythm  . COPD (chronic obstructive pulmonary disease)   . Hypertension   . Abnormal CXR 07/06/2007; 06/15/2010    Eleveted R HD 07/06/2007 > no change 09/92/2011  . Chronic back pain   . 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 Joint replacement     Assessment & Plan Clinical Impression: Patient is a 78 y.o. year old male with recent admission to the hospital with history of atrial fibrillation not a Coumadin candidate secondary to risk of falls, syncope and COPD and recent hospitalization June 2013 for community-acquired pneumonia and CHF exacerbation. Admitted 08/08/2012 with 3 day history of worsening shortness of breath, productive cough, sore throat and wheezing and fever of 100.5. Chest x-ray  showed patchy bibasilar infiltrates or atelectasis left greater than right. Placed on broad-spectrum antibiotics. Noted on the evening of 08/08/2012 patient became more agitated increasing shortness of breath then became unresponsive and found to be pulseless chest compressions were started he was found to be in asystole received 2 rounds of epi and one amp of bicarbonate with down time of 9 minutes by code sheet. Critical care followup patient remained on ventilator support. Patient was later extubated 08/11/2012 and monitored. Maintained on subcutaneous Lovenox for DVT prophylaxis. Followup cardiology services([facility]) for history of atrial fibrillation and maintained on amiodarone as well as aspirin therapy. Followup speech therapy for questionable dysphagia currently on full liquid diet and monitored for any signs of aspiration.  Patient transferred to CIR on 08/14/2012 .    Patient currently requires Min Assist for grooming and UB dressing, Mod Assist for bathing, Total Assis for toileting and LB dressing. with basic self-care skills secondary to muscle weakness.  Prior to hospitalization, patient could complete BADL with Min - Mod Assist..  Patient will benefit from skilled intervention to decrease level of assist with basic self-care skills prior to discharge home with care partner.  Anticipate patient will require 24 hour supervision and no further OT follow recommended.  OT - End of Session Endurance Deficit: Yes OT Assessment Rehab Potential: Good Barriers to Discharge: None OT Plan OT Frequency: 1-2 X/day, 60-90 minutes OT Treatment/Interventions: Balance/vestibular training;DME/adaptive equipment instruction;UE/LE Strength taining/ROM;Therapeutic Exercise;UE/LE Coordination activities;Therapeutic Activities;Discharge planning;Community reintegration;Cognitive remediation/compensation;Patient/family education;Self Care/advanced ADL retraining;Functional mobility training OT  Recommendation Follow Up Recommendations: None Equipment  Recommended: 3 in 1 bedside comode  OT Evaluation Precautions/Restrictions  Precautions Precautions: Fall Pain Pain Assessment Pain Assessment: No/denies pain Pain Score:   4 Home Living/Prior Functioning Home Living Lives With: Alone Available Help at Discharge: Personal care attendant ( 4 hours in AM and 3 hours in PM) Type of Home: House Home Access: Stairs to enter [employer] of Steps: 3 Entrance Stairs-Rails: Left Home Layout: Multi-level (Bedroom upstairs. Has been sleeping in lazy boy on recliner) Alternate Level Stairs-Number of Steps: 17 Alternate Level Stairs-Rails:  (lift chair) Bathroom Shower/Tub: Tub/shower unit;Curtain Bathroom Toilet: Standard Home Adaptive Equipment: Tub transfer bench;Grab bars around toilet;Grab bars in shower;Hand-held shower hose;Walker - rolling;Straight cane Additional Comments: has aides 7x week, 7 hours split. Uses walker mainly for ambulating. Prior Function Level of Independence: Needs assistance with ADLs;Needs assistance with gait;Needs assistance with tranfers Bath: Moderate Toileting: Supervision/set-up Dressing: Moderate Grooming: Minimal Meal Prep: Total Laundry: Total Light Housekeeping: Total Able to Take Stairs?: No Driving: No Vocation: Retired Optometrist - History Baseline Vision: Wears glasses for distance only Patient Visual Report: No change from baseline Vision - Assessment Eye Alignment: Within Functional Limits  Cognition Overall Cognitive Status: Appears within functional limits for tasks assessed Arousal/Alertness: Awake/alert Orientation Level: Oriented X4 Sensation Sensation Light Touch: Appears Intact Stereognosis: Appears Intact Hot/Cold: Appears Intact Proprioception: Appears Intact Coordination Gross Motor Movements are Fluid and Coordinated: Yes Fine Motor Movements are Fluid and Coordinated: Yes Mobility   Bed Mobility Bed Mobility: Supine to Sit Supine to Sit: HOB elevated;2: Max assist;With rails Supine to Sit: Patient Percentage: 20% Sitting - Scoot to Edge of Bed: 1: +1 Total assist Sitting - Scoot to [place] of Bed Details: Verbal cues for sequencing;Verbal cues for technique  Extremity/Trunk Assessment RUE Assessment RUE Assessment: Exceptions to [facility] RUE Strength Right Shoulder Flexion: 3+/5 Right Shoulder Horizontal ABduction: 3+/5 Right Shoulder Horizontal ADduction: 3+/5 Right Elbow Flexion: 3+/5 Right Elbow Extension: 3+/5 Gross Grasp: Functional LUE Assessment LUE Assessment: Exceptions to [facility] LUE Strength Left Shoulder Flexion: 3+/5 Left Shoulder Horizontal ABduction: 3+/5 Left Shoulder Horizontal ADduction: 3+/5 Left Elbow Flexion: 3+/5 Left Elbow Extension: 3+/5 Gross Grasp: Functional  See FIM for current functional status Refer to Care Plan for Long Term Goals  Recommendations for other services: None  Discharge Criteria: Patient will be discharged from OT if patient refuses treatment 3 consecutive times without medical reason, if treatment goals not met, if there is a change in medical status, if patient makes no progress towards goals or if patient is discharged from hospital.  The above assessment, treatment plan, treatment alternatives and goals were discussed and mutually agreed upon: by patient  Individual Therapy: OT eval completed this AM. Pt's aide was present during eval and provided background information regarding what patient was able to do and not. Tx session with focus on bathing, dressing, functional transfers and standing balance. Pt receives quite a bit of help with bathing and dressing from Aide. Pt is alone at night so was ambulating to the bathroom and toileting without assistance.   Limmie Patricia, OTR/L 08/15/2012, 9:40 AM

## 2012-08-15 NOTE — Evaluation (Signed)
Physical Therapy Assessment and Plan  Patient Details  Name: [name] MRN: [id number] Date of Birth: 1922/05/03  PT Diagnosis: Abnormality of gait, Difficulty walking and Muscle weakness Rehab Potential: Good ELOS: 7-10 days   Today's Date: 08/15/2012 Time: [phone] 60 minutes  Problem List:  Patient Active Problem List  Diagnosis  . HYPERTENSION, BENIGN  . Atrial fibrillation  . DIASTOLIC HEART FAILURE, CHRONIC  . DYSPNEA  . CHEST PAIN UNSPECIFIED  . Nonspecific (abnormal) findings on radiological and other examination of body structure  . ABNORMAL LUNG XRAY  . Community acquired pneumonia  . Acute respiratory failure  . CHF (congestive heart failure)  . COPD (chronic obstructive pulmonary disease)  . Anemia  . Esophageal diverticulum  . Cardiac arrest    Past Medical History:  Past Medical History  Diagnosis Date  . Atrial fibrillation with slow ventricular response     Symptoms of weakness  . Paroxysmal atrial fibrillation     Hx of, currently on amiodarone load  . Syncope     Probably related to sinus pause, finally conversion from atrial fibrillation to sinus rhythm  . COPD (chronic obstructive pulmonary disease)   . Hypertension   . Abnormal CXR 07/06/2007; 06/15/2010    Eleveted R HD 07/06/2007 > no change 09/92/2011  . Chronic back pain   . 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 Joint replacement     Assessment & Plan Clinical Impression: Patient is a 78 y.o. year old male with recent admission to the hospital on admitted 08/08/2012 with 3 day history of worsening shortness of breath, productive cough, sore throat and wheezing and fever of 100.5. Chest x-ray showed patchy bibasilar infiltrates or atelectasis left greater than right. Placed on broad-spectrum antibiotics. Noted on the evening of 08/08/2012 patient became more agitated increasing shortness of  breath then became unresponsive and found to be pulseless chest compressions were started he was found to be in asystole received 2 rounds of epi and one amp of bicarbonate with down time of 9 minutes by code sheet. Critical care followup patient remained on ventilator support. Patient was later extubated 08/11/2012 and monitored. Maintained on subcutaneous Lovenox for DVT prophylaxis. Followup cardiology services([place]) for history of atrial fibrillation and maintained on amiodarone as well as aspirin therapy. Patient transferred to CIR on 08/14/2012 .   Patient currently requires min with mobility secondary to muscle weakness, decreased oxygen support and decreased standing balance and decreased balance strategies.  Prior to hospitalization, patient was supervision with mobility and lived with Alone in a House home.  Home access is 3Stairs to enter.  Patient will benefit from skilled PT intervention to maximize safe functional mobility, minimize fall risk and decrease caregiver burden for planned discharge home with 24 hour supervision.  Anticipate patient will benefit from follow up HH at discharge.  PT - End of Session Activity Tolerance: Tolerates 30+ min activity with multiple rests Endurance Deficit: Yes PT Assessment Rehab Potential: Good Barriers to Discharge: None PT Plan PT Frequency: 1-2 X/day, 60-90 minutes Estimated Length of Stay: 7-10 days PT Treatment/Interventions: Balance/vestibular training;Discharge planning;Ambulation/gait training;Community reintegration;DME/adaptive equipment instruction;Neuromuscular re-education;Therapeutic Exercise;UE/LE Strength taining/ROM;Pain management;Splinting/orthotics;Stair training;Wheelchair propulsion/positioning;UE/LE Coordination activities;Therapeutic Activities;Functional mobility training;Patient/family education PT Recommendation Follow Up Recommendations: Home health PT  PT  Evaluation Precautions/Restrictions Precautions Precautions: Fall Restrictions Weight Bearing Restrictions: No  Vital Signs Oxygen Therapy SpO2:  (pt O2 sats >90% on room air  throughout eval) Pain Pain Assessment Pain Assessment: No/denies pain Pain Score: 0-No pain Faces Pain Scale: Hurts little more Pain Type: Acute pain Pain Location: Rib cage Pain Descriptors: Aching Pain Onset: With Activity Pain Intervention(s): RN made aware Home Living/Prior Functioning Home Living Lives With: Alone Available Help at Discharge: Personal care attendant;Available PRN/intermittently Type of Home: House Home Access: Stairs to enter [employer] of Steps: 3 Entrance Stairs-Rails: Left Home Layout: Multi-level;Able to live on main level with bedroom/bathroom Alternate Level Stairs-Number of Steps: has been staying on one level Home Adaptive Equipment: Walker - rolling Prior Function Level of Independence: Needs assistance with ADLs;Requires assistive device for independence Able to Take Stairs?: Yes Driving: No Vocation: Retired  IT consultant Overall Cognitive Status: Appears within functional limits for tasks assessed Sensation Sensation Light Touch: Appears Intact Proprioception: Appears Intact Coordination Gross Motor Movements are Fluid and Coordinated: Yes Motor  Motor Motor: Within Functional Limits Motor - Skilled Clinical Observations: generalized weakness  Mobility Bed Mobility Supine to Sit: 4: Min assist Supine to Sit Details (indicate cue type and reason): assist for wt shifting Transfers Sit to Stand: 4: Min assist Sit to Stand Details (indicate cue type and reason): assist for anterior wt shift and lifting Stand to Sit: 4: Min assist Stand to Sit Details: cues for UE placement Stand Pivot Transfers: 4: Min assist Stand Pivot Transfer Details (indicate cue type and reason): steadying assist, assist for wt shifts Locomotion  Ambulation Ambulation:  Yes Ambulation/Gait Assistance: 4: Min assist Ambulation Distance (Feet): 40 Feet Assistive device: Rolling walker Ambulation/Gait Assistance Details: steadying assist, cues for deep breathing Stairs / Additional Locomotion Stairs: Yes Stairs Assistance: 4: Min assist Stair Management Technique: Two rails Number of Stairs: 3  Wheelchair Mobility Wheelchair Mobility: Yes Wheelchair Assistance: 4: Min Education officer, museum: Both upper extremities Wheelchair Parts Management: Needs assistance Distance: 25' limited by pain  Trunk/Postural Assessment  Cervical Assessment Cervical Assessment:  (forward head, ROM limited all directions) Thoracic Assessment Thoracic Assessment:  (kyphosis) Lumbar Assessment Lumbar Assessment:  (pelvis fixed, limited ROM) Postural Control Postural Control: Within Functional Limits  Balance Static Sitting Balance Static Sitting - Level of Assistance: 5: Stand by assistance Static Standing Balance Static Standing - Level of Assistance: 4: Min assist (with RW for functional task) Dynamic Standing Balance Dynamic Standing - Level of Assistance: 4: Min assist (with RW for functional task) Extremity Assessment      RLE Assessment RLE Assessment:  (grossly 3/5) LLE Assessment LLE Assessment:  (grossly 3/5)  See FIM for current functional status Refer to Care Plan for Long Term Goals  Recommendations for other services: None  Discharge Criteria: Patient will be discharged from PT if patient refuses treatment 3 consecutive times without medical reason, if treatment goals not met, if there is a change in medical status, if patient makes no progress towards goals or if patient is discharged from hospital.  The above assessment, treatment plan, treatment alternatives and goals were discussed and mutually agreed upon: by patient  Treatment initiated during session: Multiple reps of short distance gait with RW, min A for activity tolerance, pt  requiring 2-3 minutes seated rest and cues for deep breathing between sets.  Stair training with 1 handrail to simulate pt's home.  Pt required mod A and HHA to ascend/descend 3 stairs.  Supine therex for LE strength and endurance.  Pt fatigues quickly requiring rest after 5 reps of each exercise.  [name] 08/15/2012, 2:13 PM

## 2012-08-16 ENCOUNTER — Inpatient Hospital Stay (HOSPITAL_COMMUNITY): Payer: Medicare Other | Admitting: Occupational Therapy

## 2012-08-16 NOTE — Progress Notes (Signed)
Subjective/Complaints: Slept better. Doesn't like food A 12 point review of systems has been performed and if not noted above is otherwise negative.   Objective: Vital Signs: Blood pressure 111/70, pulse 76, temperature 98.1 F (36.7 C), temperature source Oral, resp. rate 17, height 6' (1.829 m), weight 66.5 kg (146 lb 9.7 oz), SpO2 98.00%. No results found. No results found for this basename: WBC:2,HGB:2,HCT:2,PLT:2 in the last 72 hours No results found for this basename: NA:2,K:2,CL:2,CO2:2,GLUCOSE:2,BUN:2,CREATININE:2,CALCIUM:2 in the last 72 hours CBG (last 3)  No results found for this basename: GLUCAP:3 in the last 72 hours  Wt Readings from Last 3 Encounters:  08/16/12 66.5 kg (146 lb 9.7 oz)  08/12/12 63.4 kg (139 lb 12.4 oz)  05/08/12 63.504 kg (140 lb)    Physical Exam:  HENT:  Head: Normocephalic.  Eyes:  Pupils reactive to light  Neck: Neck supple. No thyromegaly present.  Cardiovascular:  Cardiac rate is controlled. No murmur or rub  Pulmonary/Chest:  Decreased breath sounds at the bases . Upper airway sounds heard. No distress .  RU chest wall is sore. Abdominal: Soft. Bowel sounds are normal. He exhibits no distension.  Musculoskeletal: He exhibits no edema.  Neurological: He is alert.  Patient was able to name person place and date of birth. He did need some cues for overall awareness. Fair insight. A little impulsive, attention can wax and wane. He followed basic commands  Skin: Skin is warm and dry.  Motor strength 4 minus/5 in bilateral deltoid, biceps, and triceps, grip  4 minus/5 in bilateral hip flexors knee extensors ankle dorsiflexors and plantar flexors  Sensation intact to light touch in both upper and lower limbs. DTr's 1+.   Assessment/Plan: 1. Functional deficits secondary to deconditioning after respiratory and cardiac arrest which require 3+ hours per day of interdisciplinary therapy in a comprehensive inpatient rehab setting. Physiatrist is  providing close team supervision and 24 hour management of active medical problems listed below. Physiatrist and rehab team continue to assess barriers to discharge/monitor patient progress toward functional and medical goals. FIM: FIM - Bathing Bathing Steps Patient Completed: Chest;Right Arm;Left Arm;Abdomen;Front perineal area;Buttocks;Right upper leg;Left upper leg;Right lower leg (including foot);Left lower leg (including foot) Bathing: 3: Mod-Patient completes 5-7 48f 10 parts or 50-74%  FIM - Upper Body Dressing/Undressing Upper body dressing/undressing steps patient completed: Thread/unthread right sleeve of front closure shirt/dress;Thread/unthread left sleeve of front closure shirt/dress;Pull shirt around back of front closure shirt/dress;Button/unbutton shirt Upper body dressing/undressing: 4: Min-Patient completed 75 plus % of tasks FIM - Lower Body Dressing/Undressing Lower body dressing/undressing steps patient completed: Thread/unthread right pants leg;Thread/unthread left pants leg;Pull pants up/down;Fasten/unfasten pants;Don/Doff right sock;Don/Doff left sock;Don/Doff right shoe;Don/Doff left shoe;Fasten/unfasten right shoe;Fasten/unfasten left shoe Lower body dressing/undressing: 1: Total-Patient completed less than 25% of tasks  FIM - Toileting Toileting steps completed by patient: Adjust clothing prior to toileting;Performs perineal hygiene;Adjust clothing after toileting Toileting: 1: Total-Patient completed zero steps, helper did all 3  FIM - Toilet Transfers Toilet Transfers: 2-To toilet/BSC: Max A (lift and lower assist);2-From toilet/BSC: Max A (lift and lower assist)  FIM - Bed/Chair Transfer Bed/Chair Transfer: 1: Two helpers  FIM - Locomotion: Wheelchair Distance: 25' limited by pain Locomotion: Wheelchair: 1: Travels less than 50 ft with minimal assistance (Pt.>75%) FIM - Locomotion: Ambulation Ambulation/Gait Assistance: 4: Min assist Locomotion: Ambulation:  1: Travels less than 50 ft with minimal assistance (Pt.>75%)  Comprehension Comprehension Mode: Auditory Comprehension: 5-Follows basic conversation/direction: With no assist  Expression Expression Mode: Verbal Expression: 5-Expresses basic  needs/ideas: With no assist  Social Interaction Social Interaction: 5-Interacts appropriately 90% of the time - Needs monitoring or encouragement for participation or interaction.  Problem Solving Problem Solving: 5-Solves basic 90% of the time/requires cueing < 10% of the time  Memory Memory: 5-Requires cues to use assistive device Medical Problem List and Plan:  1. Deconditioning/pneumonia/respiratory failure and cardiac arrest  2. DVT Prophylaxis/Anticoagulation: Subcutaneous Lovenox. Monitor platelet counts and any signs of bleeding  3. Pain Management/chronic back pain: Oxycodone as needed. Monitor mental status as well as monitor with increased mobility   -add lidoderm for chest wall pain 4. Mood: Remeron 7.5 mg each bedtime. Provide emotional support and positive reinforcement  5. Neuropsych: This patient is capable of making decisions on his/her own behalf.  6. ID. 10 day course of Avelox  7. Dysphagia.   Watch closely for tolerance   -doesn't like current diet, but i explained to him why he was on it.  -he ate almost all of his breakfast this am, so i will hold off on appetite stimulants at present  -will need regular encouragement  -mbs this week 8. Hypertension/ A. fib. Amiodarone 200 mg daily. Cardiac rate controlled and monitored  9. COPD. Continue nebulizers as advised. Monitor closely in light of pneumonia and hospital course respiratory failure. Check oxygen saturations every shift. Recent chest x-ray 08/11/2012 showing improvement right upper lobe aeration.  10. CHF. Monitor for any signs of fluid overload 11. imsomnia- trazodone      LOS (Days) 2 A FACE TO FACE EVALUATION WAS PERFORMED  Tamakia Porto T 08/16/2012, 8:14  AM

## 2012-08-16 NOTE — Progress Notes (Signed)
Occupational Therapy Session Note  Patient Details  Name: [name] MRN: [id number] Date of Birth: November 23, 1921  Today's Date: 08/16/2012 Time: 1010-1110 Time Calculation (min): 60 min  Short Term Goals: Week 1:  OT Short Term Goal 1 (Week 1): Pt will increase toileting to Max asssist, OT Short Term Goal 2 (Week 1): Pt will increase bathing to MIn Assist at shower level. OT Short Term Goal 3 (Week 1): Pt will increase LB dressing to Max Assist. OT Short Term Goal 4 (Week 1): Pt will increase toileitng to Max Assist. OT Short Term Goal 5 (Week 1): Pt will increase Bil UE strength and endurance to increase functional transfers.  Skilled Therapeutic Interventions/Progress Updates:  Patient seated in w/c and private duty CNA Billey Gosling) in the room.upon arrival.  Initially and through the beginning portion on b/dsg session, patient required increased encouragement to participate and stating "Billey Gosling will do all of the bathing and dressing for me I just need to gain strength and endurance while I'm here".  Reviewed benefits of performing as much of his bath and dress as possible with improve his strength and endurance.  Patient reluctantly agreed to participate and CNA also provided encouragement.  Patient declined shower and opted for sponge bath and dress at sink to include stand only for pants dow & up as well as to wash private parts.  Patient became very winded and needed numerous rest breaks and pursed lip breathing encouraged.  Therapy Documentation Precautions:  Precautions Precautions: Fall Restrictions Weight Bearing Restrictions: No Pain: C/o rib pain, not rated and reports premedicated  ADL:  See FIM for current functional status  Therapy/Group: Individual Therapy  Brocha Gilliam 08/16/2012, 12:10 PM

## 2012-08-17 ENCOUNTER — Inpatient Hospital Stay (HOSPITAL_COMMUNITY): Payer: Medicare Other | Admitting: Physical Therapy

## 2012-08-17 ENCOUNTER — Inpatient Hospital Stay (HOSPITAL_COMMUNITY): Payer: Medicare Other | Admitting: Occupational Therapy

## 2012-08-17 ENCOUNTER — Inpatient Hospital Stay (HOSPITAL_COMMUNITY): Payer: Medicare Other | Admitting: Speech Pathology

## 2012-08-17 ENCOUNTER — Inpatient Hospital Stay (HOSPITAL_COMMUNITY): Payer: Medicare Other

## 2012-08-17 DIAGNOSIS — R1310 Dysphagia, unspecified: Secondary | ICD-10-CM

## 2012-08-17 DIAGNOSIS — J159 Unspecified bacterial pneumonia: Secondary | ICD-10-CM

## 2012-08-17 DIAGNOSIS — R5381 Other malaise: Secondary | ICD-10-CM

## 2012-08-17 LAB — COMPREHENSIVE METABOLIC PANEL
ALT: 20 U/L (ref 0–53)
AST: 16 U/L (ref 0–37)
Albumin: 2.7 g/dL — ABNORMAL LOW (ref 3.5–5.2)
BUN: 16 mg/dL (ref 6–23)
CO2: 31 mEq/L (ref 19–32)
Calcium: 8.5 mg/dL (ref 8.4–10.5)
Chloride: 107 mEq/L (ref 96–112)
Creatinine, Ser: 0.85 mg/dL (ref 0.50–1.35)
Glucose, Bld: 93 mg/dL (ref 70–99)
Potassium: 3.9 mEq/L (ref 3.5–5.1)
Total Protein: 5.5 g/dL — ABNORMAL LOW (ref 6.0–8.3)

## 2012-08-17 LAB — CBC WITH DIFFERENTIAL/PLATELET
HCT: 27.2 % — ABNORMAL LOW (ref 39.0–52.0)
Hemoglobin: 8.8 g/dL — ABNORMAL LOW (ref 13.0–17.0)
Lymphs Abs: 1.2 10*3/uL (ref 0.7–4.0)
Monocytes Relative: 12 % (ref 3–12)
Neutro Abs: 3.9 10*3/uL (ref 1.7–7.7)
Neutrophils Relative %: 66 % (ref 43–77)
RBC: 2.56 MIL/uL — ABNORMAL LOW (ref 4.22–5.81)

## 2012-08-17 NOTE — Progress Notes (Signed)
Occupational Therapy Session Note  Patient Details  Name: [name] MRN: [id number] Date of Birth: 1922-06-02  Today's Date: 08/17/2012 Time: 0800-0850 Time Calculation (min): 50 min  Short Term Goals: Week 1:  OT Short Term Goal 1 (Week 1): Pt will increase toileting to Max asssist, OT Short Term Goal 2 (Week 1): Pt will increase bathing to MIn Assist at shower level. OT Short Term Goal 3 (Week 1): Pt will increase LB dressing to Max Assist. OT Short Term Goal 4 (Week 1): Pt will increase toileitng to Max Assist. OT Short Term Goal 5 (Week 1): Pt will increase Bil UE strength and endurance to increase functional transfers.  Skilled Therapeutic Interventions/Progress Updates:    Pt chose to perform bathing and dressing sit to stand at the sink.  Able to use his RW and min assist to transfer to the wheelchair at the sink.  Sits in a slumped posture secondary to kyphosis.  Encouraged upright sitting to help increase breathing position as well as more efficient UE use.  Pt with dyspnea 3/4 during activity.  Decreased ability to reach his feet during bathing task.  Discussed possible use of a LH sponge at home as well.  Did cross his RLE over the left to donn his sock and shoe, but unable to do the same with the left.  Session cut short secondary to needing to go down for MBS.    Therapy Documentation Precautions:  Precautions Precautions: Fall Restrictions Weight Bearing Restrictions: No General: General Amount of Missed OT Time (min): 10 Minutes Vital Signs: Therapy Vitals Temp: 98.1 F (36.7 C) Temp src: Oral Pulse Rate: 77  Resp: 17  BP: 116/73 mmHg Patient Position, if appropriate: Lying Oxygen Therapy SpO2: 96 % O2 Device: None (Room air) Pulse Oximetry Type: Intermittent Pain: Pain Assessment Pain Assessment: 0-10 Pain Score:   2 Faces Pain Scale: Hurts a little bit Pain Type: Chronic pain Pain Location: Rib cage Pain Orientation: Right;Left Pain Descriptors:  Aching Pain Frequency: Intermittent Pain Onset: Gradual Pain Intervention(s): Repositioned Multiple Pain Sites: Yes 2nd Pain Site Pain Score: 5 Pain Type: Acute pain Pain Location: Back Pain Orientation: Lower Pain Descriptors: Aching Pain Frequency: Intermittent Pain Onset: Gradual Pain Intervention(s): Repositioned ADL: See FIM for current functional status  Therapy/Group: Individual Therapy  Dorthy Hustead OTR/L 08/17/2012, 8:54 AM

## 2012-08-17 NOTE — Progress Notes (Signed)
Subjective/Complaints: Slept better. Eating better.  A 12 point review of systems has been performed and if not noted above is otherwise negative.   Objective: Vital Signs: Blood pressure 116/73, pulse 77, temperature 98.1 F (36.7 C), temperature source Oral, resp. rate 17, height 6' (1.829 m), weight 67.7 kg (149 lb 4 oz), SpO2 97.00%. No results found.  Basename 08/17/12 0610  WBC 6.0  HGB 8.8*  HCT 27.2*  PLT 336    Basename 08/17/12 0610  NA 143  K 3.9  CL 107  CO2 31  GLUCOSE 93  BUN 16  CREATININE 0.85  CALCIUM 8.5   CBG (last 3)  No results found for this basename: GLUCAP:3 in the last 72 hours  Wt Readings from Last 3 Encounters:  08/17/12 67.7 kg (149 lb 4 oz)  08/12/12 63.4 kg (139 lb 12.4 oz)  05/08/12 63.504 kg (140 lb)    Physical Exam:  HENT:  Head: Normocephalic.  Eyes:  Pupils reactive to light  Neck: Neck supple. No thyromegaly present.  Cardiovascular:  Cardiac rate is controlled. No murmur or rub  Pulmonary/Chest:  Better air movement . Upper airway sounds heard. No distress .  RU chest wall is sore. Abdominal: Soft. Bowel sounds are normal. He exhibits no distension.  Musculoskeletal: He exhibits no edema.  Neurological: He is alert.  Patient was able to name person place and date of birth. He did need some cues for overall awareness. Fair insight. . He followed basic commands  Skin: Skin is warm and dry.  Motor strength 4 minus/5 in bilateral deltoid, biceps, and triceps, grip  4 minus/5 in bilateral hip flexors knee extensors ankle dorsiflexors and plantar flexors  Sensation intact to light touch in both upper and lower limbs. DTr's 1+.   Assessment/Plan: 1. Functional deficits secondary to deconditioning after respiratory and cardiac arrest which require 3+ hours per day of interdisciplinary therapy in a comprehensive inpatient rehab setting. Physiatrist is providing close team supervision and 24 hour management of active medical  problems listed below. Physiatrist and rehab team continue to assess barriers to discharge/monitor patient progress toward functional and medical goals. FIM: FIM - Bathing Bathing Steps Patient Completed: Chest;Right Arm;Left Arm;Abdomen;Front perineal area;Buttocks;Right upper leg;Left upper leg Bathing: 4: Min-Patient completes 8-9 40f 10 parts or 75+ percent  FIM - Upper Body Dressing/Undressing Upper body dressing/undressing steps patient completed: Thread/unthread left sleeve of front closure shirt/dress;Pull shirt around back of front closure shirt/dress Upper body dressing/undressing: 3: Mod-Patient completed 50-74% of tasks FIM - Lower Body Dressing/Undressing Lower body dressing/undressing steps patient completed: Thread/unthread right pants leg;Thread/unthread left pants leg;Don/Doff right sock;Don/Doff left sock;Don/Doff right shoe;Don/Doff left shoe;Thread/unthread right underwear leg;Thread/unthread left underwear leg;Pull underwear up/down Lower body dressing/undressing: 4: Min-Patient completed 75 plus % of tasks  FIM - Toileting Toileting steps completed by patient: Adjust clothing prior to toileting;Performs perineal hygiene;Adjust clothing after toileting Toileting: 1: Total-Patient completed zero steps, helper did all 3  FIM - Toilet Transfers Toilet Transfers: 2-To toilet/BSC: Max A (lift and lower assist);2-From toilet/BSC: Max A (lift and lower assist)  FIM - Bed/Chair Transfer Bed/Chair Transfer: 1: Two helpers  FIM - Locomotion: Wheelchair Distance: 25' limited by pain Locomotion: Wheelchair: 1: Travels less than 50 ft with minimal assistance (Pt.>75%) FIM - Locomotion: Ambulation Ambulation/Gait Assistance: 4: Min assist Locomotion: Ambulation: 1: Travels less than 50 ft with minimal assistance (Pt.>75%)  Comprehension Comprehension Mode: Auditory Comprehension: 5-Follows basic conversation/direction: With no assist  Expression Expression Mode:  Verbal Expression: 5-Expresses basic needs/ideas:  With no assist  Social Interaction Social Interaction: 5-Interacts appropriately 90% of the time - Needs monitoring or encouragement for participation or interaction.  Problem Solving Problem Solving: 5-Solves basic 90% of the time/requires cueing < 10% of the time  Memory Memory: 5-Requires cues to use assistive device Medical Problem List and Plan:  1. Deconditioning/pneumonia/respiratory failure and cardiac arrest  2. DVT Prophylaxis/Anticoagulation: Subcutaneous Lovenox. Monitor platelet counts and any signs of bleeding  3. Pain Management/chronic back pain: Oxycodone as needed. Monitor mental status as well as monitor with increased mobility   -add lidoderm for chest wall pain 4. Mood: Remeron 7.5 mg each bedtime. Provide emotional support and positive reinforcement  5. Neuropsych: This patient is capable of making decisions on his/her own behalf.  6. ID. 10 day course of Avelox  7. Dysphagia.   Watch closely for tolerance   -doesn't like current diet, but i explained to him why he was on it.  -he ate almost all of his breakfast this am, so i will hold off on appetite stimulants at present  -will need regular encouragement  -mbs this week 8. Hypertension/ A. fib. Amiodarone 200 mg daily. Cardiac rate controlled and monitored  9. COPD. Continue nebulizers as advised. Monitor closely in light of pneumonia and hospital course respiratory failure. Check oxygen saturations every shift. Recent chest x-ray 08/11/2012 showing improvement right upper lobe aeration.  10. CHF. Monitor for any signs of fluid overload 11. imsomnia- trazodone      LOS (Days) 3 A FACE TO FACE EVALUATION WAS PERFORMED  SWARTZ,ZACHARY T 08/17/2012, 7:46 AM

## 2012-08-17 NOTE — Progress Notes (Signed)
Social Work  Social Work Assessment and Plan  Patient Details  Name: [name] MRN: [id number] Date of Birth: 1921-12-12  Today's Date: 08/17/2012  Problem List:  Patient Active Problem List  Diagnosis  . HYPERTENSION, BENIGN  . Atrial fibrillation  . DIASTOLIC HEART FAILURE, CHRONIC  . DYSPNEA  . CHEST PAIN UNSPECIFIED  . Nonspecific (abnormal) findings on radiological and other examination of body structure  . ABNORMAL LUNG XRAY  . Community acquired pneumonia  . Acute respiratory failure  . CHF (congestive heart failure)  . COPD (chronic obstructive pulmonary disease)  . Anemia  . Esophageal diverticulum  . Cardiac arrest  . Physical deconditioning   Past Medical History:  Past Medical History  Diagnosis Date  . Atrial fibrillation with slow ventricular response     Symptoms of weakness  . Paroxysmal atrial fibrillation     Hx of, currently on amiodarone load  . Syncope     Probably related to sinus pause, finally conversion from atrial fibrillation to sinus rhythm  . COPD (chronic obstructive pulmonary disease)   . Hypertension   . Abnormal CXR 07/06/2007; 06/15/2010    Eleveted R HD 07/06/2007 > no change 09/92/2011  . Chronic back pain   . 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 Joint replacement    Social History:  reports that he quit smoking about 48 years ago. He does not have any smokeless tobacco history on file. He reports that he drinks about 3.5 ounces of alcohol per week. He reports that he does not use illicit drugs.  Family / Support Systems Marital Status: Divorced How Long?: 24 years Patient Roles: Parent Children: daughter, Morton Stall (local) @ (H) [phone] or (C) [phone]; son, Tryton Bodi, III [facility]) @ (C) [phone] and another son in [place] Other Supports: private duty caregiver Anticipated Caregiver: Langley Adie, CNA - [phone]  (via agency, [employer]) Ability/Limitations of Caregiver: Has CNA currently working 7 hours a day Caregiver Availability: Other (Comment) (Can increase caregiver support as needed.) Family Dynamics: Daughter describes pt as "very much a control person" and that, while she tries to offer support, he is "very resistent to other people's ideas".  Daughter is very concerned about pt at home when no caregiver is there, but he has not been receptive to increasing assistance to 24/7 care.  Social History Preferred language: English Religion: Presbyterian Cultural Background: NA Education: college Read: Yes Write: Yes Employment Status: Retired Fish farm manager Issues: none Guardian/Conservator: daugher, Tourist information centre manager, is pt's POA   Abuse/Neglect Physical Abuse: Denies Verbal Abuse: Denies Sexual Abuse: Denies Exploitation of patient/patient's resources: Denies Self-Neglect: Denies  Emotional Status Pt's affect, behavior adn adjustment status: Pt very matter-of-fact in providing his personal information.  Very focused on making the point that his wish is to get home ASAP and resume the assistance he had before.  Confirmed that he could increase amount of assistance provided "...but I don't want to."  Denies any emotional distress at this time.  Will monitor. Recent Psychosocial Issues: recent hospitalizations over the past 6 months and approx 5 day stay at SNF Pyschiatric History: none Substance Abuse History: none  Patient / Family Perceptions, Expectations & Goals Pt/Family understanding of illness & functional limitations: Pt reports that the reason for hospital admit "...was for my breathing then I my heart stopped the second day I was here".  Aware he is on CIR to  rebuild strength and endurance, however, very reluctant to discuss possibility that he increase hours of assistance at home.  Daughter with good understanding of pt's medical issues and likely need for 24/7 assistance,  "...but he probable won't do it" Premorbid pt/family roles/activities: Pt had private duty care 7 hours per day x 7 days per week.  Used walker to move in home.  No driving.  Manages his own financial affairs. Anticipated changes in roles/activities/participation: Pt will likely need an increase to 24/7 care. Pt/family expectations/goals: Pt's goal is to return to his home ASAP.  Will possibly consider increasing assistance.  Hopes to rebuild his overall strength and for his private duty caregiver to "learn how to do therapy for me at home"  [employer]: None Premorbid Home Care/DME Agencies: Other (Comment) Therapist, [employer] broker) Transportation available at discharge: yes  Discharge Planning Living Arrangements: Alone;Other (Comment) (7 hrs/ day private duty caregiver) Support Systems: Children;Home care staff Type of Residence: Private residence Insurance Resources: [employer] Financial Resources: Social Security Financial Screen Referred: No Living Expenses: Own Money Management: Patient Do you have any problems obtaining your medications?: No Home Management: private duty support  Patient/Family Preliminary Plans: Pt plans to return to his own home with private duty care - increase of time still to be considered Social Work Anticipated Follow Up Needs: HH/OP Expected length of stay: 7-10 days  Clinical Impression  Elderly gentleman here after cardiac arrest w/ pneumonia and very deconditioned.  He is hopeful to have short LOS and plans to resume private duty care upon d/c.  Aware tx team may recommend increase in private duty hours to 24/7, however, he is resistant to this and team will need to be very specific on reasoning for this.  Daughter very supportive and willing to assist, however, describes her father as "... A control person" and resistant to any family help. Will follow to provide assistance with d/c planning as  appropriate.   Cynthya Yam 08/17/2012, 1:24 PM

## 2012-08-17 NOTE — Procedures (Signed)
Objective Swallowing Evaluation:    Patient Details  Name: [name] MRN: [id number] Date of Birth: May 12, 1922  Today's Date: 08/17/2012 Time: 0900-1000 Time Calculation (min): 60 min  Skilled Therapeutic Intervention: Administered MBSS, please see below for details. Pt also provided verbal and visual education on current diet recommendations and swallowing compensatory strategies. Pt verbalizes understanding but requires continued education.   Past Medical History:  Past Medical History  Diagnosis Date  . Atrial fibrillation with slow ventricular response     Symptoms of weakness  . Paroxysmal atrial fibrillation     Hx of, currently on amiodarone load  . Syncope     Probably related to sinus pause, finally conversion from atrial fibrillation to sinus rhythm  . COPD (chronic obstructive pulmonary disease)   . Hypertension   . Abnormal CXR 07/06/2007; 06/15/2010    Eleveted R HD 07/06/2007 > no change 09/92/2011  . Chronic back pain   . 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 Joint replacement    HPI: 78 yo male adm to [facility] 08/08/12 with shortness of breath - pt coughing on secretions and had fever. Pt with hospital admission for CAP 04/02/12. On 10/26 pt was given Ativan due to agitation and was noted be be choking on secretions - became unresponsive - pulseless - cardiac arrest. Pt required intubation 10/26 extubated 10/29. Pt also has h/o COPD. CXR 10/29 indicated worsening left patchy airspace disease and slight improved right upper lobe aeration. BSE administered 08/11/12 and placed on a full liquid diet with compensatory strategies. Pt transferred to CIR 08/14/12 and administered another BSE and recommended to continue full liquid diet with MBSS to objectively assess swallow function for possible diet upgrade.   Recommendation/Prognosis  Clinical Impression Clinical impression: Pt  presents with a mild oral-pharyngeal phase and mild cervical esophageal dysphagia characterized by decreased mastication and AP transit with puree and solid textures. Pt also demonstrates a delayed swallow initiation with all consistencies but is able to fully protect his airway with no penetration or aspiration noted.  Pt also noted to have minimal-mild residue at the CP segment which is impacted by what appears to be a severe curvature of his cervical spine.  The pt has 2 diverticuli that have been well documented throughout his chart but do not appear to significantly impact his swallowing function.  The residue in the diverticuli was reduced with the use of multiple swallows and alternating solids/liquids. Pt administered a barium pill that appeared slow to clear through the CP segment and upper-mid esophagus and required multiple swallows and alternating boluses to empty into his stomach. Recommend Dys. 3 textures with thin liquids with full supervision for utilization of compensatory strategies and medications crushed in puree.  Swallow Evaluation Recommendations Diet Recommendations: Dysphagia 3 (Mechanical Soft);Thin liquid Liquid Administration via: Cup Medication Administration: Crushed with puree Supervision: Full supervision/cueing for compensatory strategies;Patient able to self feed Compensations: Slow rate;Small sips/bites;Follow solids with liquid;Multiple dry swallows after each bite/sip Postural Changes and/or Swallow Maneuvers: Out of bed for meals;Seated upright 90 degrees;Upright 30-60 min after meal Oral Care Recommendations: Oral care BID Other Recommendations: Have oral suction available Follow up Recommendations: 24 hour supervision/assistance;Home health SLP Prognosis Prognosis for Safe Diet Advancement: Good Individuals Consulted Consulted and Agree with Results and Recommendations: Patient  SLP Assessment/Plan Clinical impression: Pt presents with a mild oral-pharyngeal  phase and mild cervical esophageal dysphagia characterized by  decreased mastication and AP transit with puree and solid textures. Pt also demonstrates a delayed swallow initiation with all consistencies but is able to fully protect his airway with no penetration or aspiration noted.  Pt also noted to have minimal-mild residue at the CP segment which is impacted by what appears to be a severe curvature of his cervical spine.  The pt has 2 diverticuli that have been well documented throughout his chart but do not appear to significantly impact his swallowing function.  The residue in the diverticuli was reduced with the use of multiple swallows and alternating solids/liquids. Pt administered a barium pill that appeared slow to clear through the CP segment and upper-mid esophagus and required multiple swallows and alternating boluses to empty into his stomach. Recommend Dys. 3 textures with thin liquids with full supervision for utilization of compensatory strategies and medications crushed in puree.   Short Term Goals: Week 1: SLP Short Term Goal 1 (Week 1): Pt will consume Dys. 3 textures and thin liquids without overt s/s of aspiration with Mod verbal cues. SLP Short Term Goal 2 (Week 1): Pt will utilize swallowing compensatory strategies with Mod verbal cues.   General:  Date of Onset: 08/11/12 Reason for Referral: Objectively evaluate swallowing function Previous Swallow Assessment: BSE 08/15/12: Recommended to continue full liquid diet with MBSS to objectively evaluate swallow function  Respiratory Status: Supplemental O2 delivered via (comment) History of Recent Intubation: Yes Length of Intubations (days): 4 days Date extubated: 08/11/12 Behavior/Cognition: Alert;Cooperative Oral Cavity - Dentition: Dentures, top;Missing dentition Oral Motor / Sensory Function: Within functional limits Baseline Vocal Quality: Clear Volitional Cough: Weak Volitional Swallow: Able to elicit Anatomy: Other  (Comment) (pt appears to have a severe curvature to his cervical spine) Pharyngeal Secretions: Not observed secondary MBS  Reason for Referral:  Objectively evaluate swallowing function   Oral Phase Oral Preparation/Oral Phase Oral Phase: Impaired Oral - Thin Oral - Thin Cup: Within functional limits Oral - Solids Oral - Puree: Piecemeal swallowing Oral - Regular: Impaired mastication;Delayed oral transit;Reduced posterior propulsion (due to missing dentition and not wearing dentures) Pharyngeal Phase  Pharyngeal - Thin Pharyngeal - Thin Cup: Delayed swallow initiation;Pharyngeal residue - valleculae;Compensatory strategies attempted (Comment);Premature spillage to valleculae (cued second swallow cleared residue) Pharyngeal - Solids Pharyngeal - Puree: Pharyngeal residue - cp segment;Pharyngeal residue - valleculae;Delayed swallow initiation;Compensatory strategies attempted (Comment);Premature spillage to valleculae (cued second swallow reduced residue) Pharyngeal - Regular: Pharyngeal residue - cp segment;Delayed swallow initiation;Pharyngeal residue - valleculae;Compensatory strategies attempted (Comment);Premature spillage to valleculae (cued second swallow reduced residue) Pharyngeal - Pill: Compensatory strategies attempted (Comment) Cervical Esophageal Phase  Cervical Esophageal Phase Cervical Esophageal Phase: Impaired Cervical Esophageal Phase - Comment Cervical Esophageal Comment: Pt with two diverticuli in the mid esophagus that have been well documented throughout his chart, residue was reduced with mutliple swallows and alternating of liquids/soilds . Strategies were also effective in clearing pill fom upper-mid esophagus.   Bascom Biel 08/17/2012, 3:59 PM

## 2012-08-17 NOTE — Progress Notes (Signed)
Occupational Therapy Session Note  Patient Details  Name: [name] MRN: [id number] Date of Birth: 1921-11-22  Today's Date: 08/17/2012 Time: 1300-1330 Time Calculation (min): 30 min  Short Term Goals: Week 1:  OT Short Term Goal 1 (Week 1): Pt will increase toileting to Max asssist, OT Short Term Goal 2 (Week 1): Pt will increase bathing to MIn Assist at shower level. OT Short Term Goal 3 (Week 1): Pt will increase LB dressing to Max Assist. OT Short Term Goal 4 (Week 1): Pt will increase toileitng to Max Assist. OT Short Term Goal 5 (Week 1): Pt will increase Bil UE strength and endurance to increase functional transfers.  Skilled Therapeutic Interventions/Progress Updates:    Self care retraining focusing on activity tolerance, functional ambulation and transfers with RW, and toileting tasks. Pt ambulated short distance down hall with RW before requiring a rest break with increased breathing rate and SpO2 95-96%. Performed toilet transfer using RW and grab bars with Min A to get up from toilet; pt able to get pants down but required help to pull up and fasten pants. When ambulating out of BR, pt became increasingly fatigued from toileting tasks and sit<>stands and required rest break; pt with high HR (~100bpm) with SpO2 remaining above 90%. Transferred back to w/c and then to recliner chair in pt's room S.  Therapy Documentation Precautions:  Precautions Precautions: Fall Restrictions Weight Bearing Restrictions: No Pain: C/o of pain in back that is chronic; relieved with rest See FIM for current functional status  Therapy/Group: Individual Therapy  Jackelyn Poling 08/17/2012, 3:11 PM

## 2012-08-17 NOTE — Progress Notes (Signed)
Patient information reviewed and entered into eRehab system by Jesus Nevills, RN, CRRN, PPS Coordinator.  Information including medical coding and functional independence measure will be reviewed and updated through discharge.     Per nursing patient was given "Data Collection Information Summary for Patients in Inpatient Rehabilitation Facilities with attached "Privacy Act Statement-Health Care Records" upon admission.  

## 2012-08-17 NOTE — Progress Notes (Signed)
This note has been reviewed and this clinician agrees with information provided.  

## 2012-08-17 NOTE — Progress Notes (Signed)
Physical Therapy Note  Patient Details  Name: [name] MRN: [id number] Date of Birth: 05-09-1922 Today's Date: 08/17/2012  Time: 1400-1454 54 minutes  Pt c/o rib pain, RN made aware.  Gait training short distances with focus on activity tolerance and RW safety. Pt requires cues for safety with RW with turns and obstacle negotiation.  SPT training multiple attempts with focus on controlling descent and safety with supervision, cuing needed each attempt for safety, little carry over throughout session.  Nu step per patient request 5 mins, 3 mins level 1 with 2 minute seated rest between for pt O2 to resolve from 87% to 93% on room air.  Sit to stand training for LE strength and endurance.  Pt able to perform only 2x before requiring assistance and prolonged rest.  Pt limited by decreased activity tolerance.  Individual therapy   [name] 08/17/2012, 3:02 PM

## 2012-08-18 ENCOUNTER — Inpatient Hospital Stay (HOSPITAL_COMMUNITY): Payer: Medicare Other | Admitting: Physical Therapy

## 2012-08-18 ENCOUNTER — Inpatient Hospital Stay (HOSPITAL_COMMUNITY): Payer: Medicare Other | Admitting: Speech Pathology

## 2012-08-18 ENCOUNTER — Encounter (HOSPITAL_COMMUNITY): Payer: Medicare Other | Admitting: Occupational Therapy

## 2012-08-18 DIAGNOSIS — R5381 Other malaise: Secondary | ICD-10-CM

## 2012-08-18 DIAGNOSIS — J159 Unspecified bacterial pneumonia: Secondary | ICD-10-CM

## 2012-08-18 DIAGNOSIS — R1310 Dysphagia, unspecified: Secondary | ICD-10-CM

## 2012-08-18 MED ORDER — FLEET ENEMA 7-19 GM/118ML RE ENEM
1.0000 | ENEMA | Freq: Every day | RECTAL | Status: DC | PRN
  Filled 2012-08-18: qty 1

## 2012-08-18 MED ORDER — BISACODYL 10 MG RE SUPP
10.0000 mg | Freq: Every day | RECTAL | Status: DC | PRN
  Filled 2012-08-18: qty 1

## 2012-08-18 MED ORDER — POLYETHYLENE GLYCOL 3350 17 G PO PACK
17.0000 g | PACK | Freq: Every day | ORAL | Status: DC
  Administered 2012-08-18 – 2012-08-22 (×5): 17 g via ORAL
  Filled 2012-08-18 (×6): qty 1

## 2012-08-18 NOTE — Progress Notes (Signed)
Physical Therapy Session Note  Patient Details  Name: [name] MRN: [id number] Date of Birth: 11-29-1921  Today's Date: 08/18/2012 Time: 4098-1191 Time Calculation (min): 39 min  Short Term Goals: Week 1:   STGs = LTGs  Skilled Therapeutic Interventions/Progress Updates:    This session focused on gait and endurance training as well as stair training.  Sit <->stand with armrests and RW min assist due to end of day fatigue.  Transfers WC<-> mat table min assist due to end of day fatigue, gait with RW min assist with WC to follow to encourage increased gait distance 60' x 3.  Stairs with bil handrails min assist x 5 stairs 4-6" height.  Pt needed frequent rest breaks due to pain and DOE due to rib pain.  O2 sats stayed in the mid 90s on RA.    Therapy Documentation Precautions:  Precautions Precautions: Fall Restrictions Weight Bearing Restrictions: No General:   Vital Signs: Therapy Vitals Temp: 98.5 F (36.9 C) Temp src: Oral Pulse Rate: 76  Resp: 18  BP: 131/57 mmHg Patient Position, if appropriate: Lying Oxygen Therapy SpO2: 100 % O2 Device: None (Room air) Pain: Pain Assessment Pain Assessment: 0-10 Pain Score:   4 Pain Type: Acute pain Pain Location: Rib cage Pain Orientation: Right;Left Pain Intervention(s): Repositioned;Ambulation/increased activity (pre medicated) Mobility:   Locomotion : Ambulation Ambulation/Gait Assistance: 4: Min assist Wheelchair Mobility Distance: 150   See FIM for current functional status  Therapy/Group: Individual Therapy  [name], PT, DPT [id number]   08/18/2012, 4:18 PM

## 2012-08-18 NOTE — Progress Notes (Signed)
Occupational Therapy Session Note  Patient Details  Name: [name] MRN: [id number] Date of Birth: 03/13/1922  Today's Date: 08/18/2012 Time: 0930-1030 Time Calculation (min): 60 min  Short Term Goals: Week 1:  OT Short Term Goal 1 (Week 1): Pt will increase toileting to Max asssist, OT Short Term Goal 2 (Week 1): Pt will increase bathing to MIn Assist at shower level. OT Short Term Goal 3 (Week 1): Pt will increase LB dressing to Max Assist. OT Short Term Goal 4 (Week 1): Pt will increase toileitng to Max Assist. OT Short Term Goal 5 (Week 1): Pt will increase Bil UE strength and endurance to increase functional transfers.  Skilled Therapeutic Interventions/Progress Updates:    Self care retraining with bathing, dressing and grooming tasks focusing on functional mobility/transfers with RW, activity tolerance, sit<>stands for bathing and clothing management tasks, caregiver education, and energy conservation. Performed tub/shower transfer using tub bench with steady A for legs; pt's hired caregiver "Billey Gosling" present for session and participated in self care tasks to maintain previous home routine. Educated pt and caregiver on endurance deficits and techniques to conserve energy during ADL tasks. Placed written reminder on pt's walker and wall in room to perform deep breathing "IN nose and OUT mouth."  Therapy Documentation Precautions:  Precautions Precautions: Fall Restrictions Weight Bearing Restrictions: No Pain: No c/o pain  See FIM for current functional status  Therapy/Group: Individual Therapy  Jackelyn Poling 08/18/2012, 1:22 PM

## 2012-08-18 NOTE — Progress Notes (Signed)
Subjective/Complaints: Slept better. Happy that diet was advanced   A 12 point review of systems has been performed and if not noted above is otherwise negative.   Objective: Vital Signs: Blood pressure 134/83, pulse 74, temperature 97.8 F (36.6 C), temperature source Oral, resp. rate 19, height 6' (1.829 m), weight 69.4 kg (153 lb), SpO2 100.00%. No results found.  Basename 08/17/12 0610  WBC 6.0  HGB 8.8*  HCT 27.2*  PLT 336    Basename 08/17/12 0610  NA 143  K 3.9  CL 107  CO2 31  GLUCOSE 93  BUN 16  CREATININE 0.85  CALCIUM 8.5   CBG (last 3)  No results found for this basename: GLUCAP:3 in the last 72 hours  Wt Readings from Last 3 Encounters:  08/18/12 69.4 kg (153 lb)  08/12/12 63.4 kg (139 lb 12.4 oz)  05/08/12 63.504 kg (140 lb)    Physical Exam:  HENT:  Head: Normocephalic.  Eyes:  Pupils reactive to light  Neck: Neck supple. No thyromegaly present.  Cardiovascular:  Cardiac rate is controlled. No murmur or rub  Pulmonary/Chest:  Better air movement . Upper airway sounds heard. No distress .  RU chest wall is sore. Abdominal: Soft. Bowel sounds are normal. He exhibits no distension.  Musculoskeletal: He exhibits no edema.  Neurological: He is alert.  Patient was able to name person place and date of birth. He did need some cues for overall awareness. Fair insight. . He followed basic commands  Skin: Skin is warm and dry.  Motor strength 4 minus/5 in bilateral deltoid, biceps, and triceps, grip  4 minus/5 in bilateral hip flexors knee extensors ankle dorsiflexors and plantar flexors  Sensation intact to light touch in both upper and lower limbs. DTr's 1+.   Assessment/Plan: 1. Functional deficits secondary to deconditioning after respiratory and cardiac arrest which require 3+ hours per day of interdisciplinary therapy in a comprehensive inpatient rehab setting. Physiatrist is providing close team supervision and 24 hour management of active  medical problems listed below. Physiatrist and rehab team continue to assess barriers to discharge/monitor patient progress toward functional and medical goals. FIM: FIM - Bathing Bathing Steps Patient Completed: Chest;Right Arm;Left Arm;Abdomen;Front perineal area;Right upper leg;Left upper leg;Buttocks Bathing: 4: Min-Patient completes 8-9 31f 10 parts or 75+ percent  FIM - Upper Body Dressing/Undressing Upper body dressing/undressing steps patient completed: Thread/unthread left sleeve of front closure shirt/dress;Button/unbutton shirt Upper body dressing/undressing: 3: Mod-Patient completed 50-74% of tasks FIM - Lower Body Dressing/Undressing Lower body dressing/undressing steps patient completed: Thread/unthread right pants leg;Pull underwear up/down;Don/Doff right sock;Don/Doff right shoe Lower body dressing/undressing: 3: Mod-Patient completed 50-74% of tasks  FIM - Toileting Toileting steps completed by patient: Adjust clothing prior to toileting;Performs perineal hygiene;Adjust clothing after toileting Toileting: 1: Total-Patient completed zero steps, helper did all 3  FIM - Toilet Transfers Toilet Transfers: 2-To toilet/BSC: Max A (lift and lower assist);2-From toilet/BSC: Max A (lift and lower assist)  FIM - Bed/Chair Transfer Bed/Chair Transfer: 5: Sit > Supine: Supervision (verbal cues/safety issues)  FIM - Locomotion: Wheelchair Distance: 25' limited by pain Locomotion: Wheelchair: 1: Travels less than 50 ft with minimal assistance (Pt.>75%) FIM - Locomotion: Ambulation Ambulation/Gait Assistance: 4: Min assist Locomotion: Ambulation: 1: Travels less than 50 ft with minimal assistance (Pt.>75%)  Comprehension Comprehension Mode: Auditory Comprehension: 5-Understands basic 90% of the time/requires cueing < 10% of the time  Expression Expression Mode: Verbal Expression: 5-Expresses basic 90% of the time/requires cueing < 10% of the time.  Social  Interaction Social  Interaction: 5-Interacts appropriately 90% of the time - Needs monitoring or encouragement for participation or interaction.  Problem Solving Problem Solving: 5-Solves basic 90% of the time/requires cueing < 10% of the time  Memory Memory: 4-Recognizes or recalls 75 - 89% of the time/requires cueing 10 - 24% of the time Medical Problem List and Plan:  1. Deconditioning/pneumonia/respiratory failure and cardiac arrest  2. DVT Prophylaxis/Anticoagulation: Subcutaneous Lovenox. Monitor platelet counts and any signs of bleeding  3. Pain Management/chronic back pain: Oxycodone as needed. Monitor mental status as well as monitor with increased mobility   -added lidoderm for chest wall pain with some relief 4. Mood: Remeron 7.5 mg each bedtime. Provide emotional support and positive reinforcement  5. Neuropsych: This patient is capable of making decisions on his/her own behalf.  6. ID. 10 day course of Avelox  7. Dysphagia. - diet upgraded to D3 thins with supervision  -  Watch closely for tolerance    -likely acute on chronic issues. Appreciate SLP help 8. Hypertension/ A. fib. Amiodarone 200 mg daily. Cardiac rate controlled and monitored  9. COPD. Continue nebulizers as advised. Monitor closely in light of pneumonia and hospital course respiratory failure. Check oxygen saturations every shift. Recent chest x-ray 08/11/2012 showing improvement right upper lobe aeration.  10. CHF. Monitor for any signs of fluid overload 11. imsomnia- trazodone helpful 12. Anemia: recheck Thursday  -hesitate adding Fe supplement given po intake      LOS (Days) 4 A FACE TO FACE EVALUATION WAS PERFORMED  SWARTZ,ZACHARY T 08/18/2012, 8:30 AM

## 2012-08-18 NOTE — Progress Notes (Signed)
Physical Therapy Session Note  Patient Details  Name: [name] MRN: [id number] Date of Birth: 08-10-1922  Today's Date: 08/18/2012 Time: 1324-4010 Time Calculation (min): 30 min  Short Term Goals: Week 1:     Skilled Therapeutic Interventions/Progress Updates:   Pt performed sit/stand transfer from recliner to [facility] with min A required for cuing to scoot forward and to place hands and feet properly to safely perform transfer.  Patient propelled WC 50' to improve endurance in controlled environment before becoming fatigued.  O2 at 94% on room air.  Patient able to ambulate 48' with RW to improve endurance with minimum assistance for safety and cuing for improved breathing technique before requiring seated rest break.  Patient ascended/descended 3 steps with 1 hand rail with minimum assistance for instruction on hand placement to hand rails and breathing techniques.  Patient maintained 94% O2 sats during session on room air.     Therapy Documentation Precautions:  Precautions Precautions: Fall Restrictions Weight Bearing Restrictions: No General:   Vital Signs: Oxygen Therapy SpO2: 98 % O2 Device: None (Room air) Pain: Pain Assessment Pain Assessment: 0-10 Pain Score:   4 Pain Type: Acute pain Pain Location: Other (Comment) (ribcage) Pain Orientation: Right;Left Pain Descriptors: Constant Pain Onset: On-going Patients Stated Pain Goal: 2 Pain Intervention(s): RN made aware Mobility:   Locomotion : Ambulation Ambulation/Gait Assistance: 4: Min assist   See FIM for current functional status  Therapy/Group: Individual Therapy  Rexene Agent 08/18/2012, 12:17 PM

## 2012-08-18 NOTE — Progress Notes (Signed)
Speech Language Pathology Daily Session Note  Patient Details  Name: [name] MRN: [id number] Date of Birth: [birth year]  Today's Date: 08/18/2012 Time: 0800-0855 Time Calculation (min): 55 min  Short Term Goals: Week 1: SLP Short Term Goal 1 (Week 1): Pt will consume Dys. 3 textures and thin liquids without overt s/s of aspiration with Mod verbal cues. SLP Short Term Goal 2 (Week 1): Pt will utilize swallowing compensatory strategies with Mod verbal cues.   Skilled Therapeutic Interventions: Treatment focus on dysphagia goals and skilled observation of diet tolerance and utilization of swallowing compensatory strategies with upgraded diet of Dys. 3 textures and thin liquids. Pt required Mod A visual and question cues to recall swallowing compensatory strategies of slow pace, small bites, multiple swallows and alternating solids and liquids and Mod A verbal cues to utilize them throughout the meal. Pt also demonstrated an intermittent wet vocal quality that cleared with both cued and spontaneous throat clears. Pt's caregiver "Billey Gosling" present throughout session and educated on current diet recommendations and compensatory strategies. He verbalized understanding but reports he does not feel comfortable supervising the pt with his meals at this time.    FIM:  Comprehension Comprehension Mode: Auditory Comprehension: 5-Understands basic 90% of the time/requires cueing < 10% of the time Expression Expression Mode: Verbal Expression: 5-Expresses basic 90% of the time/requires cueing < 10% of the time. Problem Solving Problem Solving: 5-Solves basic 90% of the time/requires cueing < 10% of the time Memory Memory: 4-Recognizes or recalls 75 - 89% of the time/requires cueing 10 - 24% of the time FIM - Eating Eating Activity: 5: Set-up assist for open containers;5: Supervision/cues  Pain No/denies Pain  Therapy/Group: Individual Therapy  Vian Fluegel 08/18/2012, 11:15 AM

## 2012-08-19 ENCOUNTER — Inpatient Hospital Stay (HOSPITAL_COMMUNITY): Payer: Medicare Other

## 2012-08-19 ENCOUNTER — Inpatient Hospital Stay (HOSPITAL_COMMUNITY): Payer: Medicare Other | Admitting: Physical Therapy

## 2012-08-19 ENCOUNTER — Inpatient Hospital Stay (HOSPITAL_COMMUNITY): Payer: Medicare Other | Admitting: Speech Pathology

## 2012-08-19 DIAGNOSIS — R1310 Dysphagia, unspecified: Secondary | ICD-10-CM

## 2012-08-19 DIAGNOSIS — J159 Unspecified bacterial pneumonia: Secondary | ICD-10-CM

## 2012-08-19 DIAGNOSIS — R5381 Other malaise: Secondary | ICD-10-CM

## 2012-08-19 NOTE — Patient Care Conference (Signed)
Inpatient RehabilitationTeam Conference Note Date: 08/18/2012   Time: 2:55 PM    Patient Name: [name]      Medical Record Number: [id number]  Date of Birth: 03-Aug-1922 Sex: Male         Room/Bed: 4149/4149-01 Payor Info: Payor: MEDICARE  Plan: MEDICARE PART A AND B  Product Type: *No Product type*     Admitting Diagnosis: deconditioned pna  Admit Date/Time:  08/14/2012  6:47 PM Admission Comments: No comment available   Primary Diagnosis:  Physical deconditioning Principal Problem: Physical deconditioning  Patient Active Problem List   Diagnosis Date Noted  . Physical deconditioning 08/17/2012  . Cardiac arrest 08/09/2012  . COPD (chronic obstructive pulmonary disease) 08/08/2012  . Anemia 08/08/2012  . Esophageal diverticulum 08/08/2012  . Community acquired pneumonia 04/08/2012  . Acute respiratory failure 04/08/2012  . CHF (congestive heart failure) 04/08/2012  . Nonspecific (abnormal) findings on radiological and other examination of body structure 06/15/2010  . ABNORMAL LUNG XRAY 06/15/2010  . DIASTOLIC HEART FAILURE, CHRONIC 12/12/2009  . HYPERTENSION, BENIGN 03/23/2009  . Atrial fibrillation 03/23/2009  . DYSPNEA 03/23/2009  . CHEST PAIN UNSPECIFIED 03/23/2009    Expected Discharge Date: Expected Discharge Date: 08/22/12  Team Members Present: Physician: Dr. Faith Rogue Social Worker Present: Amada Jupiter, LCSW Nurse Present: Other (comment) Kennon Portela, RN) PT Present: Reggy Eye, PT OT Present: Mackie Pai, Marye Round, OT;Ardis Rowan, COTA SLP Present: Feliberto Gottron, SLP Other (Discipline and Name): Tora Duck, PPS Coordinator and Bayard Hugger, RN     Current Status/Progress Goal Weekly Team Focus  Medical   deconditioned after multiple medical. likely baseline dementia and swallowing issues  maintain medical stability, increase nutrition and strengthen swallow  see above   Bowel/Bladder   last BM 10-31. Pt had 2 doses of sorbital  with no results. Plan to give laxative after therapy  have BM q2 days  relieve constipation   Swallow/Nutrition/ Hydration   Dys. 3 textures and thin liquids, Mod A for utilization of swallowing compensatory strategies   Min A  increased utilization of swallowing compensatory strategies    ADL's   Min A UB, Mod A LB, Min transfers, Max toileting  Min A bathing, S UB, S tub/shower transfer, Mod I toilet transfer and toileting  functional transfers, activity endurance, dynamic balance, sit<>stands   Mobility   supervision-min A  supervision  activity tolerance   Communication             Safety/Cognition/ Behavioral Observations            Pain   pain in rib cage area, chronic back pain. Oxycodone 5mg  q4hrs PRN.  3 or less  monitor pain q shift and medicate as needed   Skin   scattered bruising          Rehab Goals Patient on target to meet rehab goals: Yes *See Interdisciplinary Assessment and Plan and progress notes for long and short-term goals  Barriers to Discharge: baseline dementia with reduced memory and insight    Possible Resolutions to Barriers:  supervision at home/aide    Discharge Planning/Teaching Needs:  Pt very adamant about plan to d/c home with private duty caregiver.  He may consider increase in hours for care to 24/7 but non-committal on this.        Team Discussion:  Slow progress and anticipate that he will require 24/7 supervision/ assist.    Revisions to Treatment Plan:  None   Continued Need for Acute Rehabilitation Level of  Care: The patient requires daily medical management by a physician with specialized training in physical medicine and rehabilitation for the following conditions: Daily direction of a multidisciplinary physical rehabilitation program to ensure safe treatment while eliciting the highest outcome that is of practical value to the patient.: Yes Daily medical management of patient stability for increased activity during participation  in an intensive rehabilitation regime.: Yes Daily analysis of laboratory values and/or radiology reports with any subsequent need for medication adjustment of medical intervention for : Neurological problems;Other;Post surgical problems;Pulmonary problems  [name] 08/19/2012, 9:53 AM

## 2012-08-19 NOTE — Progress Notes (Signed)
Speech Language Pathology Daily Session Note  Patient Details  Name: [name] MRN: [id number] Date of Birth: 10-14-1922  Today's Date: 08/19/2012 Time: 0800-0855 Time Calculation (min): 55 min  Short Term Goals: Week 1: SLP Short Term Goal 1 (Week 1): Pt will consume Dys. 3 textures and thin liquids without overt s/s of aspiration with Mod verbal cues. SLP Short Term Goal 2 (Week 1): Pt will utilize swallowing compensatory strategies with Mod verbal cues.   Skilled Therapeutic Interventions: Treatment focus on dysphagia goals and skilled observation of diet tolerance and utilization of swallowing compensatory strategies with current diet of Dys. 3 textures and thin liquids. Pt required Min A visual and question cues to recall swallowing compensatory strategies of slow pace, small bites, multiple swallows and alternating solids and liquids and Min A verbal cues to utilize them throughout the meal. Pt did not demonstrate a wet vocal quality, however utilized spontaneous throat clearing throughout the meal (habitual?). Pt's caregiver "Billey Gosling" was present throughout the meal and provided external aids for the pt to recall strategies.    FIM:  Comprehension Comprehension Mode: Auditory Comprehension: 5-Follows basic conversation/direction: With extra time/assistive device Expression Expression Mode: Verbal Expression: 5-Expresses basic needs/ideas: With extra time/assistive device Social Interaction Social Interaction: 5-Interacts appropriately 90% of the time - Needs monitoring or encouragement for participation or interaction. Problem Solving Problem Solving: 4-Solves basic 75 - 89% of the time/requires cueing 10 - 24% of the time Memory Memory: 4-Recognizes or recalls 75 - 89% of the time/requires cueing 10 - 24% of the time FIM - Eating Eating Activity: 5: Supervision/cues;5: Set-up assist for open containers  Pain Pain Assessment Pain Assessment: No/denies  pain   Therapy/Group: Individual Therapy  [name] 08/19/2012, 2:02 PM

## 2012-08-19 NOTE — Progress Notes (Signed)
Physical Therapy Note  Patient Details  Name: [name] MRN: [id number] Date of Birth: [birth year] Today's Date: 08/19/2012  Time: 1100-1142 42 minutes  Pt c/o rib pain with activity, eases with rest.  Pt able to gait short distance <10' before fatigue.  Pt states his legs feel more tired today.  Able to gait 5' to car and perform car transfer with supervision to sedan simulated car.  Pt requires min A to gait back to w/c 5' due to fatigue.  Nu step for seated LE strengthening and endurance 2 x 4 minutes level 4.  Pt requires increased rest breaks before able to perform sit to stand and stand pivot transfers to safely make it back to his room.  Pt limited by LE fatigue this session.  Individual therapy   Karinda Cabriales 08/19/2012, 12:19 PM

## 2012-08-19 NOTE — Progress Notes (Signed)
Physical Therapy Note  Patient Details  Name: YADIR ZENTNER MRN: [id number] Date of Birth: 06-08-1922 Today's Date: 08/19/2012  Time: 1355-1430 35 minutes  No c/o pain.  Gait training with RW 50' x 2 with close supervision.  Ramp/curb negotiation with RW requiring min A for safety with lifting RW up curb, supervision for balance.  Pt fatigues easily requiring frequent rests.  Obstacle negotiation with RW with close supervision.  Standing balance and endurance training with functional reaching activity 3 x 60 seconds with close supervision.  Pt with improved activity tolerance vs morning session.  Individual therapy   Rome Echavarria 08/19/2012, 2:50 PM

## 2012-08-19 NOTE — Progress Notes (Signed)
Occupational Therapy Session Note  Patient Details  Name: [name] MRN: [id number] Date of Birth: May 04, 1922  Today's Date: 08/19/2012 Time: 0900-0955 Time Calculation (min): 55 min  Short Term Goals: Week 1:  OT Short Term Goal 1 (Week 1): Pt will increase toileting to Max asssist, OT Short Term Goal 2 (Week 1): Pt will increase bathing to MIn Assist at shower level. OT Short Term Goal 3 (Week 1): Pt will increase LB dressing to Max Assist. OT Short Term Goal 4 (Week 1): Pt will increase toileitng to Max Assist. OT Short Term Goal 5 (Week 1): Pt will increase Bil UE strength and endurance to increase functional transfers.  Skilled Therapeutic Interventions/Progress Updates:    Pt in bed upon arrival but agreeable to participating in bathing and dressing tasks w/c level at sink.  Pt declined shower this morning.  Paid caregiver present and assisted pt with bathing and dressing tasks.  Caregiver stated patient was able to complete bathing and dressing tasks independently at home with extra time although patient remains somewhat passive and requires verbal cues to initiate sequential tasks.  Upon completion of bating and dressing pt transitioned to gym to engage in PNF exercises with .5 KG weighted ball.  Focus on activity tolerance, standing balance, functional ambulation, and safety awareness.  Therapy Documentation Precautions:  Precautions Precautions: Fall Restrictions Weight Bearing Restrictions: No   Pain: Pain Assessment Pain Assessment: No/denies pain Pain Score:   3 Pain Type: Acute pain Pain Location: Rib cage (back) Pain Orientation: Right;Left Pain Descriptors: Aching Pain Onset: Gradual Pain Intervention(s): Medication (See eMAR)  See FIM for current functional status  Therapy/Group: Individual Therapy  Rich Brave 08/19/2012, 10:03 AM

## 2012-08-19 NOTE — Progress Notes (Signed)
Patient last bowel movement was 08/13/2012 offered laxative but patient refused. Report given to next shift nurse.

## 2012-08-19 NOTE — Progress Notes (Signed)
Patient ID: [name], male   DOB: 01-Apr-1922, 78 y.o.   MRN: [id number] Subjective/Complaints: Slept better. Happy that diet was advanced   A 12 point review of systems has been performed and if not noted above is otherwise negative.   Objective: Vital Signs: Blood pressure 104/68, pulse 82, temperature 98 F (36.7 C), temperature source Oral, resp. rate 19, height 6' (1.829 m), weight 70.1 kg (154 lb 8.7 oz), SpO2 99.00%. No results found.  Basename 08/17/12 0610  WBC 6.0  HGB 8.8*  HCT 27.2*  PLT 336    Basename 08/17/12 0610  NA 143  K 3.9  CL 107  CO2 31  GLUCOSE 93  BUN 16  CREATININE 0.85  CALCIUM 8.5   CBG (last 3)  No results found for this basename: GLUCAP:3 in the last 72 hours  Wt Readings from Last 3 Encounters:  08/19/12 70.1 kg (154 lb 8.7 oz)  08/12/12 63.4 kg (139 lb 12.4 oz)  05/08/12 63.504 kg (140 lb)    Physical Exam:  HENT:  Head: Normocephalic.  Eyes:  Pupils reactive to light  Neck: Neck supple. No thyromegaly present.  Cardiovascular:  Cardiac rate is controlled.Irreg Irreg No murmur or rub  Pulmonary/Chest:  Better air movement .Crackles R base Upper airway sounds heard. No distress .  RU chest wall is sore. Abdominal: Soft. Bowel sounds are normal. He exhibits no distension.  Musculoskeletal: He exhibits no edema.  Neurological: He is alert.  Patient was able to name person place and date of birth. He did need some cues for overall awareness. Fair insight. . He followed basic commands  Skin: Skin is warm and dry.  Motor strength 4 minus/5 in bilateral deltoid, biceps, and triceps, grip  4 minus/5 in bilateral hip flexors knee extensors ankle dorsiflexors and plantar flexors  Sensation intact to light touch in both upper and lower limbs. DTr's 1+.   Assessment/Plan: 1. Functional deficits secondary to deconditioning after respiratory and cardiac arrest which require 3+ hours per day of interdisciplinary therapy in a comprehensive  inpatient rehab setting. Physiatrist is providing close team supervision and 24 hour management of active medical problems listed below. Physiatrist and rehab team continue to assess barriers to discharge/monitor patient progress toward functional and medical goals. FIM: FIM - Bathing Bathing Steps Patient Completed: Chest;Right Arm;Left Arm;Abdomen;Front perineal area;Right upper leg;Left upper leg;Buttocks Bathing: 4: Min-Patient completes 8-9 60f 10 parts or 75+ percent  FIM - Upper Body Dressing/Undressing Upper body dressing/undressing steps patient completed: Thread/unthread left sleeve of pullover shirt/dress;Thread/unthread right sleeve of pullover shirt/dresss Upper body dressing/undressing: 3: Mod-Patient completed 50-74% of tasks (pt's hired caregiver provided assistance) FIM - Lower Body Dressing/Undressing Lower body dressing/undressing steps patient completed: Fasten/unfasten pants Lower body dressing/undressing: 1: Total-Patient completed less than 25% of tasks  FIM - Toileting Toileting steps completed by patient: Adjust clothing prior to toileting;Performs perineal hygiene;Adjust clothing after toileting Toileting: 1: Total-Patient completed zero steps, helper did all 3  FIM - Toilet Transfers Toilet Transfers: 2-To toilet/BSC: Max A (lift and lower assist);2-From toilet/BSC: Max A (lift and lower assist)  FIM - Banker Devices: Therapist, occupational: 5: Bed > Chair or W/C: Supervision (verbal cues/safety issues);5: Chair or W/C > Bed: Supervision (verbal cues/safety issues)  FIM - Locomotion: Wheelchair Distance: 150 Locomotion: Wheelchair: 2: Travels 150 ft or more: maneuvers on rugs and over door sills with maximal assistance (Pt: 25 - 49%) FIM - Locomotion: Ambulation Locomotion: Ambulation Assistive Devices: Designer, industrial/product  Ambulation/Gait Assistance: 4: Min assist Locomotion: Ambulation: 2: Travels 50 - 149 ft with  minimal assistance (Pt.>75%)  Comprehension Comprehension Mode: Auditory Comprehension: 5-Understands complex 90% of the time/Cues < 10% of the time  Expression Expression Mode: Verbal Expression: 5-Expresses basic needs/ideas: With no assist  Social Interaction Social Interaction: 5-Interacts appropriately 90% of the time - Needs monitoring or encouragement for participation or interaction.  Problem Solving Problem Solving: 5-Solves basic 90% of the time/requires cueing < 10% of the time  Memory Memory: 4-Recognizes or recalls 75 - 89% of the time/requires cueing 10 - 24% of the time Medical Problem List and Plan:  1. Deconditioning/pneumonia/respiratory failure and cardiac arrest  2. DVT Prophylaxis/Anticoagulation: Subcutaneous Lovenox. Monitor platelet counts and any signs of bleeding  3. Pain Management/chronic back pain: Oxycodone as needed. Monitor mental status as well as monitor with increased mobility   -added lidoderm for chest wall pain with some relief 4. Mood: Remeron 7.5 mg each bedtime. Provide emotional support and positive reinforcement  5. Neuropsych: This patient is capable of making decisions on his/her own behalf.  6. ID. 10 day course of Avelox  7. Dysphagia. - diet upgraded to D3 thins with supervision  -  Watch closely for tolerance    -likely acute on chronic issues. Appreciate SLP help 8. Hypertension/ A. fib. Amiodarone 200 mg daily. Cardiac rate controlled and monitored  9. COPD. Continue nebulizers as advised. Monitor closely in light of pneumonia and hospital course respiratory failure. Check oxygen saturations every shift. Recent chest x-ray 08/11/2012 showing improvement right upper lobe aeration.  10. CHF. Monitor for any signs of fluid overload 11. imsomnia- trazodone helpful 12. Anemia: recheck Thursday  -hesitate adding Fe supplement given po intake      LOS (Days) 5 A FACE TO FACE EVALUATION WAS PERFORMED  KIRSTEINS,ANDREW E 08/19/2012,  11:57 AM

## 2012-08-20 ENCOUNTER — Inpatient Hospital Stay (HOSPITAL_COMMUNITY): Payer: Medicare Other | Admitting: Physical Therapy

## 2012-08-20 ENCOUNTER — Inpatient Hospital Stay (HOSPITAL_COMMUNITY): Payer: Medicare Other | Admitting: Occupational Therapy

## 2012-08-20 ENCOUNTER — Inpatient Hospital Stay (HOSPITAL_COMMUNITY): Payer: Medicare Other | Admitting: Speech Pathology

## 2012-08-20 DIAGNOSIS — J159 Unspecified bacterial pneumonia: Secondary | ICD-10-CM

## 2012-08-20 DIAGNOSIS — R1310 Dysphagia, unspecified: Secondary | ICD-10-CM

## 2012-08-20 DIAGNOSIS — R5381 Other malaise: Secondary | ICD-10-CM

## 2012-08-20 LAB — CBC
HCT: 26.4 % — ABNORMAL LOW (ref 39.0–52.0)
Hemoglobin: 8.7 g/dL — ABNORMAL LOW (ref 13.0–17.0)
MCH: 35.1 pg — ABNORMAL HIGH (ref 26.0–34.0)
MCHC: 33 g/dL (ref 30.0–36.0)
RBC: 2.48 MIL/uL — ABNORMAL LOW (ref 4.22–5.81)

## 2012-08-20 NOTE — Progress Notes (Signed)
Pt last BM 10-31. New orders for miralaxx daily started 11-5. Encouraged suppository or fleets enema with explaining importance of emptying bowel regularly. Pt refused any medication to help with bowels. Agreed to take miralaxx daily with a lot of education given. Continues to refuse suppository or enema.

## 2012-08-20 NOTE — Progress Notes (Signed)
Social Work Patient ID: [name], male   DOB: Nov 12, 1921, 78 y.o.   MRN: [id number]  Met with patient this morning to review team conference and discuss team recommendations for home support.  Pt aware and understands that team is recommending 24/7 supervision, however, pt does not feel he needs any more that 7 am to 7 pm.  Have stressed to him that he is a fall risk and that, again, we are recommending 24/7.  Have also discussed these recommendations with pt's daughter, Rosalita Chessman, who is in agreement with team.  She reports that these recommendations have been made several times in the past, however, pt continues to disagree.  Daughter understands that pt is competent and can choose to disregard recommendations, however, he assumes the risk of injury.  Pt also understands his assumption of risk.  Pt's caregiver, Billey Gosling, was present for discussion as well.   Pt is agreeable with SW to arrange HHPT f/u and they request this be done with [place].  Pt and daughter agreeable with target d/c 11/9 and family will arrange d/c transportation.  Adelyna Brockman

## 2012-08-20 NOTE — Progress Notes (Signed)
Patient ID: [name], male   DOB: [birth year], 78 y.o.   MRN: [id number] Subjective/Complaints: Slept better. Happy that diet was advanced   A 12 point review of systems has been performed and if not noted above is otherwise negative.   Objective: Vital Signs: Blood pressure 123/65, pulse 75, temperature 97.8 F (36.6 C), temperature source Oral, resp. rate 18, height 6' (1.829 m), weight 70.1 kg (154 lb 8.7 oz), SpO2 97.00%. No results found.  Basename 08/20/12 0624  WBC 4.8  HGB 8.7*  HCT 26.4*  PLT 363   No results found for this basename: NA:2,K:2,CL:2,CO2:2,GLUCOSE:2,BUN:2,CREATININE:2,CALCIUM:2 in the last 72 hours CBG (last 3)  No results found for this basename: GLUCAP:3 in the last 72 hours  Wt Readings from Last 3 Encounters:  08/19/12 70.1 kg (154 lb 8.7 oz)  08/12/12 63.4 kg (139 lb 12.4 oz)  05/08/12 63.504 kg (140 lb)    Physical Exam:  HENT:  Head: Normocephalic.  Eyes:  Pupils reactive to light  Neck: Neck supple. No thyromegaly present.  Cardiovascular:  Cardiac rate is controlled.Irreg Irreg No murmur or rub  Pulmonary/Chest:  Better air movement .Crackles R base Upper airway sounds heard. No distress .  RU chest wall is sore. Abdominal: Soft. Bowel sounds are normal. He exhibits no distension.  Musculoskeletal: He exhibits no edema.  Neurological: He is alert.  Patient was able to name person place and date of birth. He did need some cues for overall awareness. Fair insight. . He followed basic commands  Skin: Skin is warm and dry.  Motor strength 4 minus/5 in bilateral deltoid, biceps, and triceps, grip  4 minus/5 in bilateral hip flexors knee extensors ankle dorsiflexors and plantar flexors  Sensation intact to light touch in both upper and lower limbs. DTr's 1+.   Assessment/Plan: 1. Functional deficits secondary to deconditioning after respiratory and cardiac arrest which require 3+ hours per day of interdisciplinary therapy in a comprehensive  inpatient rehab setting. Physiatrist is providing close team supervision and 24 hour management of active medical problems listed below. Physiatrist and rehab team continue to assess barriers to discharge/monitor patient progress toward functional and medical goals. FIM: FIM - Bathing Bathing Steps Patient Completed: Chest;Right Arm;Left Arm;Abdomen;Front perineal area;Right upper leg;Left upper leg;Buttocks Bathing: 4: Min-Patient completes 8-9 74f 10 parts or 75+ percent  FIM - Upper Body Dressing/Undressing Upper body dressing/undressing steps patient completed: Thread/unthread left sleeve of pullover shirt/dress;Thread/unthread right sleeve of pullover shirt/dresss Upper body dressing/undressing: 3: Mod-Patient completed 50-74% of tasks (pt's hired caregiver provided assistance) FIM - Lower Body Dressing/Undressing Lower body dressing/undressing steps patient completed: Fasten/unfasten pants Lower body dressing/undressing: 1: Total-Patient completed less than 25% of tasks  FIM - Toileting Toileting steps completed by patient: Adjust clothing prior to toileting;Performs perineal hygiene;Adjust clothing after toileting Toileting: 1: Total-Patient completed zero steps, helper did all 3  FIM - Toilet Transfers Toilet Transfers: 2-To toilet/BSC: Max A (lift and lower assist);2-From toilet/BSC: Max A (lift and lower assist)  FIM - Banker Devices: Therapist, occupational: 5: Bed > Chair or W/C: Supervision (verbal cues/safety issues);5: Chair or W/C > Bed: Supervision (verbal cues/safety issues)  FIM - Locomotion: Wheelchair Distance: 150 Locomotion: Wheelchair: 2: Travels 150 ft or more: maneuvers on rugs and over door sills with maximal assistance (Pt: 25 - 49%) FIM - Locomotion: Ambulation Locomotion: Ambulation Assistive Devices: Designer, industrial/product Ambulation/Gait Assistance: 4: Min assist Locomotion: Ambulation: 2: Travels 50 - 149 ft with  minimal assistance (Pt.>75%)  Comprehension Comprehension Mode: Auditory Comprehension: 5-Follows basic conversation/direction: With extra time/assistive device  Expression Expression Mode: Verbal Expression: 5-Expresses basic needs/ideas: With extra time/assistive device  Social Interaction Social Interaction: 5-Interacts appropriately 90% of the time - Needs monitoring or encouragement for participation or interaction.  Problem Solving Problem Solving: 4-Solves basic 75 - 89% of the time/requires cueing 10 - 24% of the time  Memory Memory: 4-Recognizes or recalls 75 - 89% of the time/requires cueing 10 - 24% of the time Medical Problem List and Plan:  1. Deconditioning/pneumonia/respiratory failure and cardiac arrest  2. DVT Prophylaxis/Anticoagulation: Subcutaneous Lovenox. Monitor platelet counts and any signs of bleeding  3. Pain Management/chronic back pain: Oxycodone as needed. Monitor mental status as well as monitor with increased mobility   -added lidoderm for chest wall pain with some relief 4. Mood: Remeron 7.5 mg each bedtime. Provide emotional support and positive reinforcement  5. Neuropsych: This patient is capable of making decisions on his/her own behalf.  6. ID. 10 day course of Avelox completed D/C ,afeb, WBCs normal 7. Dysphagia. - diet upgraded to D3 thins with supervision  -  Watch closely for tolerance    -likely acute on chronic issues. Appreciate SLP help 8. Hypertension/ A. fib. Amiodarone 200 mg daily. Cardiac rate controlled and monitored  9. COPD. Continue nebulizers as advised. Monitor closely in light of pneumonia and hospital course respiratory failure. Check oxygen saturations every shift. Recent chest x-ray 08/11/2012 showing improvement right upper lobe aeration.  10. CHF. Monitor for any signs of fluid overload 11. imsomnia- trazodone helpful 12. Anemia: recheck Thursday  -hesitate adding Fe supplement given po intake      LOS (Days) 6 A  FACE TO FACE EVALUATION WAS PERFORMED  Jhayla Podgorski E 08/20/2012, 9:54 AM

## 2012-08-20 NOTE — Progress Notes (Signed)
Occupational Therapy Session Note  Patient Details  Name: [name] MRN: [id number] Date of Birth: Dec 28, 1921  Today's Date: 08/20/2012 Time: 1000-1100 Time Calculation (min): 60 min  Short Term Goals: Week 1:  OT Short Term Goal 1 (Week 1): Pt will increase toileting to Max asssist, OT Short Term Goal 2 (Week 1): Pt will increase bathing to MIn Assist at shower level. OT Short Term Goal 3 (Week 1): Pt will increase LB dressing to Max Assist. OT Short Term Goal 4 (Week 1): Pt will increase toileitng to Max Assist. OT Short Term Goal 5 (Week 1): Pt will increase Bil UE strength and endurance to increase functional transfers.      Skilled Therapeutic Interventions/Progress Updates:      Pt was scheduled BADL retraining of toileting, bathing, and dressing, but patient was already dressed and declined participating in those tasks. Pt instead worked on Chiropodist transfers with his caregiver, Billey Gosling.  Pt was able to transfer on/ off bench with supervision.  Pt also practiced side stepping in the bathroom as his BR at home is narrow.  Pt worked on standing balance and general LE strengthening along with UE AROM to increase activity tolerance.  Discussed safe home layout and need for patient to engage in daily activity.   Therapy Documentation Precautions:  Precautions Precautions: Fall Restrictions Weight Bearing Restrictions: No   Pain: Pain Assessment Pain Assessment: No/denies pain Pain Score:   4 Pain Type: Chronic pain Pain Location: Back Pain Descriptors: Aching Pain Intervention(s): Medication (See eMAR) ADL:  See FIM for current functional status  Therapy/Group: Individual Therapy  SAGUIER,JULIA 08/20/2012, 11:41 AM

## 2012-08-20 NOTE — Progress Notes (Addendum)
Physical Therapy Session Note  Patient Details  Name: [name] MRN: [id number] Date of Birth: May 09, 1922  Today's Date: 08/20/2012 Time: 4098-1191 Time Calculation (min): 58 min  Short Term Goals: Week 1:     Skilled Therapeutic Interventions/Progress Updates:   Sit/stand transfers to/from recliner/WC with min-contact assist for improved safety.  Patient requires momentary stabilization to acquire balance upon standing.  Gait training with RW and contact assist on level surface 60' x4 with prolonged seated rest break between bouts.  Patient O2 sats dropping to 89% on room air after each bout, rebounding to 95% with rest.  Ambulation up/down ramp and up/down curb x4 with contact assist using RW.  Gait training over obstacles with RW and contact assist.  Patient continues to require seated rest for shortness of breath after each bout.  Standing with RW while performing dynamic balance activities for 2 minute bouts x3 with seated breaks between bouts.     Therapy Documentation Precautions:  Precautions Precautions: Fall Restrictions Weight Bearing Restrictions: No General:   Vital Signs:   Pain: Pain Assessment Pain Assessment: 0-10 Pain Score:   5 Pain Type: Chronic pain Pain Location: Back Pain Orientation: Right;Left Pain Descriptors: Aching Pain Onset: On-going Pain Intervention(s):  Multiple Pain Sites: No  See FIM for current functional status  Therapy/Group: Individual Therapy  Rexene Agent 08/20/2012, 3:24 PM

## 2012-08-20 NOTE — Progress Notes (Signed)
Physical Therapy Note  Patient Details  Name: [name] MRN: [id number] Date of Birth: February 06, 1922 Today's Date: 08/20/2012  Time: 830-858 28 minutes  No c/o pain.  Pt treatment focused on pt/caregiver education for gait, transfers and car transfer.  Pt/caregiver safely demo'd at supervision level for car, transfers and gait with RW, bed mobility with min A.  Pt continues to require frequent rest breaks, caregiver with good cuing for deep breathing techniques.  Individual therapy    DONAWERTH,KAREN 08/20/2012, 8:58 AM

## 2012-08-20 NOTE — Progress Notes (Signed)
Speech Language Pathology Daily Session Note  Patient Details  Name: [name] MRN: [id number] Date of Birth: [birth year]  Today's Date: 08/20/2012 Time: 0900-0940 Time Calculation (min): 40 min  Short Term Goals: Week 1: SLP Short Term Goal 1 (Week 1): Pt will consume Dys. 3 textures and thin liquids without overt s/s of aspiration with Mod verbal cues. SLP Short Term Goal 2 (Week 1): Pt will utilize swallowing compensatory strategies with Mod verbal cues.   Skilled Therapeutic Interventions: Treatment focus on dysphagia goals and caregiver education.  Pt and caregiver educated on current diet recommendations and given handout on appropriate textures. Pt/caregiver also educated on safest way to administer medications at home. Both verbalized understanding. Pt also required Total A for utilization of diaphragmatic breathing in the sitting position.    FIM:  Comprehension Comprehension Mode: Auditory Comprehension: 5-Understands basic 90% of the time/requires cueing < 10% of the time Expression Expression Mode: Verbal Expression: 5-Expresses basic 90% of the time/requires cueing < 10% of the time. Social Interaction Social Interaction: 5-Interacts appropriately 90% of the time - Needs monitoring or encouragement for participation or interaction. Problem Solving Problem Solving: 4-Solves basic 75 - 89% of the time/requires cueing 10 - 24% of the time Memory Memory: 4-Recognizes or recalls 75 - 89% of the time/requires cueing 10 - 24% of the time  Pain Pain Assessment Pain Assessment: 0-10 Pain Score:   5 Pain Type: Chronic pain Pain Location: Back Pain Orientation: Right;Left Pain Descriptors: Aching Pain Onset: On-going Pain Intervention(s): Medication (See eMAR) Multiple Pain Sites: No  Therapy/Group: Individual Therapy  [name] 08/20/2012, 3:50 PM

## 2012-08-21 ENCOUNTER — Encounter (HOSPITAL_COMMUNITY): Payer: Medicare Other | Admitting: Occupational Therapy

## 2012-08-21 ENCOUNTER — Inpatient Hospital Stay (HOSPITAL_COMMUNITY): Payer: Medicare Other | Admitting: *Deleted

## 2012-08-21 ENCOUNTER — Inpatient Hospital Stay (HOSPITAL_COMMUNITY): Payer: Medicare Other | Admitting: Speech Pathology

## 2012-08-21 ENCOUNTER — Inpatient Hospital Stay (HOSPITAL_COMMUNITY): Payer: Medicare Other | Admitting: Occupational Therapy

## 2012-08-21 MED ORDER — OXYCODONE HCL 5 MG PO TABS: 5.0000 mg | ORAL_TABLET | ORAL | Status: DC | PRN

## 2012-08-21 MED ORDER — AMIODARONE HCL 200 MG PO TABS: 200.0000 mg | ORAL_TABLET | Freq: Every day | ORAL | Status: DC

## 2012-08-21 MED ORDER — MAGNESIUM CITRATE PO SOLN
1.0000 | Freq: Once | ORAL | Status: DC
  Filled 2012-08-21: qty 296

## 2012-08-21 MED ORDER — LIDOCAINE 5 % EX PTCH: 1.0000 | MEDICATED_PATCH | CUTANEOUS | Status: DC

## 2012-08-21 NOTE — Discharge Summary (Signed)
  Discharge summary job [phone]

## 2012-08-21 NOTE — Progress Notes (Signed)
Occupational Therapy Session Note  Patient Details  Name: [name] MRN: [id number] Date of Birth: Jul 10, 1922  Today's Date: 08/21/2012 Time:  0930- 1030    Short Term Goals: Week 1:  OT Short Term Goal 1 (Week 1): Pt will increase toileting to Max asssist, OT Short Term Goal 2 (Week 1): Pt will increase bathing to MIn Assist at shower level. OT Short Term Goal 3 (Week 1): Pt will increase LB dressing to Max Assist. OT Short Term Goal 4 (Week 1): Pt will increase toileitng to Max Assist. OT Short Term Goal 5 (Week 1): Pt will increase Bil UE strength and endurance to increase functional transfers.  Skilled Therapeutic Interventions/Progress Updates:    Self care retraining with focus on toilet transfer using grab bar, toileting tasks, sit<>stands, increasing trunk strength, standing balance, and activity tolerance. Performed toilet transfer to low, standard toilet and required Min A to get up with use of grab bars. Pt required steadying A during clothing management due to LOB backwards. Participated in activity involving trunk rotation, flex/extension for increased control/strength and chair push-ups for UE strength for sit<>stands; dynamic standing balance with Min A to correct LOB. Pt ambulated to and from therapy gym requiring rest break half-way with SpO2 at 90% on room air. Discussed recommendation to use 3-in-1 at home to raise toilet seat; however, pt denied wanting to order one.  Therapy Documentation Precautions:  Precautions Precautions: Fall Restrictions Weight Bearing Restrictions: No  Pain: Back pain that is chronic, nursing and MD aware See FIM for current functional status  Therapy/Group: Individual Therapy  Jackelyn Poling 08/21/2012, 11:59 AM

## 2012-08-21 NOTE — Plan of Care (Signed)
Problem: RH Balance Goal: LTG Patient will maintain dynamic standing with ADLs (OT) LTG: Patient will maintain dynamic standing balance with assist during activities of daily living (OT)  Outcome: Adequate for Discharge Pt recommended to have 24 hr supervision.

## 2012-08-21 NOTE — Progress Notes (Signed)
magnesium citrate ordered for pt. Pt refused with 2 attempts. Explained importance of moving bowels regularly but pt continued to refuse. Last BM 10-31.

## 2012-08-21 NOTE — Discharge Summary (Signed)
[name], [name]                  ACCOUNT NO.:  [id number]  MEDICAL RECORD NO.:  [id number]  LOCATION:  4149                         FACILITY:  [facility]  PHYSICIAN:  Ranelle Oyster, M.D.DATE OF BIRTH:  1922-02-02  DATE OF ADMISSION:  08/14/2012 DATE OF DISCHARGE:  08/22/2012                              DISCHARGE SUMMARY   DISCHARGE DIAGNOSES: 1. Deconditioning - pneumonia - respiratory failure with cardiac     arrest. 2. Subcutaneous Lovenox for deep vein thrombosis prophylaxis. 3. Chronic back pain. 4. Depression. 5. Dysphagia. 6. Hypertension with atrial fibrillation and chronic obstructive     pulmonary disease.  Diastolic congestive heart failure.  This 78 year old right-handed male with history of atrial fibrillation not on Coumadin secondary to risk of falls, syncope, and COPD with recent hospitalization in June 2013 for community-acquired pneumonia and CHF exacerbation.  Admitted August 08, 2012, with a 3-day history of worsening shortness of breath, productive cough, sore throat, wheezing and a fever of 100.5.  Chest x-ray showed patchy bibasilar infiltrates or atelectasis left greater than right, placed on broad-spectrum antibiotics.  Noted on the evening of August 08, 2012, the patient became more agitated with increasing shortness of breath and became unresponsive, found to be pulseless.  Chest compressions were started. He was found to be in asystole, received 2 rounds of epinephrine and 1 amp of bicarbonate with down time of 9 minutes by __________.  Critical care followup, the patient remained on ventilator support.  The patient was later extubated August 11, 2012, and monitored.  Maintained on subcutaneous Lovenox for DVT prophylaxis.  Follow up Cardiology Services for history of atrial fibrillation, maintained on amiodarone as well as aspirin therapy.  Follow speech therapy for questionable dysphagia, currently on a full liquid diet and diet slowly advanced.   Physical and occupational therapy evaluations were completed and recommendations were made for physical medicine rehab consult.  The patient was admitted for comprehensive rehab program.  PAST MEDICAL HISTORY:  See discharge diagnoses.  SOCIAL HISTORY:  Lives alone.  He has a personal care attendant.  Functional history prior to admission.  He ambulated with a rolling walker.  Functional status upon admission to rehab services was +2 total assist to ambulate 5 feet with a rolling walker.  PHYSICAL EXAMINATION:  VITAL SIGNS:  Blood pressure 126/86, pulse 108, temperature 99, respirations 20. GENERAL:  This is an alert male, followed simple commands.  He was a bit impulsive.  Pupils round and reactive to light. LUNGS:  Decreased breath sounds.  Clear to auscultation. CARDIAC:  Regular rate and rhythm. ABDOMEN:  Soft, nontender.  Good bowel sounds.  REHABILITATION HOSPITAL COURSE:  The patient was admitted to inpatient rehab services with therapies initiated on a 3-hour daily basis consisting of physical therapy, occupational therapy, speech therapy, and 24-hour rehabilitation nursing.  The following issues were addressed during the patient's rehabilitation stay.  Pertaining to [name] deconditioning pneumonia, respiratory failure, cardiac arrest, he continued to participate fully with his therapies.  He did receive followup by Cardiology Services in regards to his atrial fibrillation. No Coumadin initiated due to fall risk.  Cardiac rate remained well controlled.  He will continue on  amiodarone as advised.  He did complete a 10-day course of Avelox for pneumonia.  Oxygen saturations remained greater than 90% on room air.  He denied any shortness of breath.  His diet was slowly advanced to a regular consistency with no signs of aspiration.  He did have a documented history of chronic obstructive pulmonary disease, again oxygen saturations remained greater than 90%. He exhibited  no signs of fluid overload.  He remained on subcutaneous Lovenox for DVT prophylaxis throughout his rehab course.  Noted history of depression as he remained on Remeron with emotional support provided. He was attending full therapies.  He did have a history of chronic back pain.  He was using oxycodone as needed as well as the addition of a Lidoderm patch with good results.  The patient received weekly collaborative interdisciplinary team conferences to discuss estimated length of stay, family teaching, and any barriers to his discharge.  He was continent of bowel and bladder, minimal assist upper body, mod assist lower body, minimal assist transfers, max assist toileting supervision, minimal assist overall for mobility.  He was to be discharged to home with personal care attendant.  There were some concerns discussed with the patient on his safety for home.  He remained adamant and confident to make decisions for discharge to home with a private duty caregiver.  Ongoing therapies would be dictated as per rehab services.  DISCHARGE MEDICATIONS:  At the time of dictation included; 1. Amiodarone 200 mg daily. 2. Aspirin 81 mg daily. 3. Pulmicort 0.25 mg twice daily. 4. Os-Cal daily. 5. Vitamin D 1000 units daily. 6. Lidoderm patch change daily. 7. Remeron 7.5 mg at bedtime. 8. Oxycodone immediate release 5 mg every 4 hours as needed pain. 9. Protonix 40 mg daily. 10.MiraLax 17 g daily with 8 ounces of water, hold for loose stool. 11.Flomax 0.4 mg at bedtime. 12.The patient would resume his Brovana nebulizer treatments as well     as his ongoing Pulmicort with Xopenex as prior to hospital     admission for history of COPD.  DIET:  Mechanical soft.  SPECIAL INSTRUCTIONS:  The patient to follow up Dr. Faith Rogue at the outpatient rehab service office as needed.  He should follow up Dr. Sherryl Manges, Cardiology Services, call for appointment in 2 weeks, Dr. Merlene Laughter medical  management appointment to be made. Patient followed Dr. Felipa Eth pulmonary services 09/03/2012 arrived at 9:30 AM  Home health therapies had been arranged.     Mariam Dollar, P.A.   ______________________________ Ranelle Oyster, M.D.    DA/MEDQ  D:  08/21/2012  T:  08/21/2012  Job:  [id number]  cc:   Hal T. Stoneking, M.D. Harrold Donath

## 2012-08-21 NOTE — Progress Notes (Signed)
Occupational Therapy Discharge Summary and Treatment Session Note  Patient Details  Name: [name] MRN: [id number] Date of Birth: [birth year]  Today's Date: 08/21/2012 Time: 1300-1330 Time Calculation (min): 30 min  Patient has met 7 of 8 long term goals due to improved activity tolerance, improved balance, postural control and ability to compensate for deficits.  Patient to discharge at overall Supervision level for functional transfers/mobility and toileting, Min A for bathing, Max A for LB dressing.  Patient overall limited by decreased strength/endurance, chronic kyphotic posture and back pain. Patient's care partner Billey Gosling, hired CNA) is independent to provide the necessary physical assistance at discharge.    Reasons goals not met: Pt requires supervision for dynamic standing balance due to LOB backwards.  Recommendation:  Patient will benefit from ongoing skilled OT services in home health setting to continue to advance functional skills in the area of BADL.  Equipment: Tub bench, 3-in-1  Reasons for discharge: treatment goals met  Patient/family agrees with progress made and goals achieved: Yes  OT Discharge Precautions/Restrictions  Precautions Precautions: Fall Restrictions Weight Bearing Restrictions: No Vital Signs Oxygen Therapy O2 Device: None (Room air) Pain Pain Assessment Pain Assessment: Faces Pain Score:   6 Faces Pain Scale: Hurts little more Pain Type: Chronic pain Pain Location: Back Pain Orientation: Upper;Mid;Lower Pain Descriptors: Aching Pain Onset: On-going Pain Intervention(s): Medication (See eMAR);Rest ADL  See FIM Vision/Perception  Vision - History Baseline Vision: Wears glasses for distance only Patient Visual Report: No change from baseline Vision - Assessment Eye Alignment: Within Functional Limits Perception Perception: Within Functional Limits Praxis Praxis: Intact  Cognition Overall Cognitive Status: Appears within  functional limits for tasks assessed Arousal/Alertness: Awake/alert Orientation Level: Oriented X4 Sensation Sensation Light Touch: Appears Intact Proprioception: Appears Intact Coordination Gross Motor Movements are Fluid and Coordinated: Yes Fine Motor Movements are Fluid and Coordinated: Yes Motor  Motor Motor: Within Functional Limits Mobility  Transfers Sit to Stand: 5: Supervision Stand to Sit: 5: Supervision  Trunk/Postural Assessment  Cervical Assessment Cervical Assessment: Exceptions to [facility] (forward head, ROM limited all directions) Thoracic Assessment Thoracic Assessment: Exceptions to [facility] (kyphotic posture) Lumbar Assessment Lumbar Assessment: Exceptions to [facility] (fixed pelvis) Postural Control Postural Control:  (limited ROM in all directions )  Balance Static Sitting Balance Static Sitting - Balance Support: Feet supported Static Sitting - Level of Assistance: 6: Modified independent (Device/Increase time) Static Standing Balance Static Standing - Level of Assistance: 6: Modified independent (Device/Increase time) Dynamic Standing Balance Dynamic Standing - Level of Assistance: 5: Stand by assistance Extremity/Trunk Assessment RUE Assessment RUE Assessment: Within Functional Limits LUE Assessment LUE Assessment: Within Functional Limits  See FIM for current functional status  Treatment Session: Focus on dynamic standing balance without UE support, standing endurance, sit<>stands for functional activity, controlled descent with stand to sit and functional mobility with RW. Participated in activity requiring dynamic reaching to each side while standing with Steadying A to correct LOB. Pt able to perform activity ~45 sec before requesting rest break x4 with SpO2 at 93-94% on room air. Pt able to ambulate short (household) distance with S before requiring rest break from fatigue.  Jackelyn Poling 08/21/2012, 12:23 PM

## 2012-08-21 NOTE — Progress Notes (Addendum)
Physical Therapy Session Note  Patient Details  Name: [name] MRN: [id number] Date of Birth: [birth year]  Today's Date: 08/21/2012 Time:  11:08-12:00 ( ) 8 min missed due to with Home health aide regarding discharge   Skilled Therapeutic Interventions/Progress Updates:  Grad day: Tx focused on all aspects of mobility, transfers, gait, and stairs.  Sit<>stand x4 with S only and occasional cues for hand placement.  Gait training 1x45, 2x50' in controlled environment and 2x25' in home environment with RW and S only. Pt able to gauge fatigue and needing to sit for safety.  Sit<supine with S only and increased pain and effort due to rib fractures. Pt able to demonstrate unsupported sitting balance during functional dynamic balance tasks with S only and limited motion due to kyphotic posture and rib pain.  Up/down 4 stairs and small ramp with bil UE on L rail only and Min A for steadying with step-to pattern. Pt SOB following this task, but VSS (see below).  Car transfer with S cues for safe hand placement to sedan height. WC propulsion x150' with Mod A for navigating turns.  Nustep x , level 4 with bil UE/LE.  Pt expressed no unresolved questions or concerns prior to DC.     Therapy Documentation Precautions:  Precautions Precautions: Fall Restrictions Weight Bearing Restrictions: No    Vital Signs: Oxygen Therapy O2 Device: None (Room air) 93% following stairs  Pain: c/o intermittent rib pain, but relieved with repositioning  See FIM for current functional status  Therapy/Group: Individual Therapy  Virl Cagey, PT 08/21/2012, 11:56 AM

## 2012-08-21 NOTE — Progress Notes (Signed)
Speech Language Pathology Discharge Summary  Patient Details  Name: [name] MRN: [id number] Date of Birth: October 10, 1922  Today's Date: 08/21/2012 Time: 0800-0845 Time Calculation (min): 45 min  Skilled Therapeutic Intervention: Treatment focus on dysphagia goals and skilled observation of diet tolerance and utilization of swallowing compensatory strategies with current diet of Dys. 3 textures and thin liquids. Pt required Min A verbal cues for utilization of small bites/sips and multiple swallows throughout the meal. Pt did not demonstrate a wet vocal quality, however utilized spontaneous throat clearing throughout the meal (habitual?). Pt's caregiver "Billey Gosling" was present throughout the meal and provided external aids for the pt to recall strategies. Pt/caregiver also educated on current diet recommendations, compensatory strategies and proper positioning to improve pt's overall swallowing safety. Both verbalized understanding.   Patient has met 3 of 3 long term goals.  Patient to discharge at overall Supervision level.   Reasons goals not met: N/A   Clinical Impression/Discharge Summary: Pt has made functional gains and has met all LTG's this admission. Pt had a MBSS on 08/17/12 and was placed on Dys. 3 textures with thin liquids with strict utilization of swallowing compensatory strategies and aspiration precautions. Pt has been tolerating his current diet and requires supervision verbal cues to utilize swallowing compensatory strategies. Pt/caregiver education complete and both verbalized understanding of all information and handouts were given.  Pt would benefit f/u home health skilled SLP intervention to maximize utilization of swallowing compensatory strategies to increase overall swallowing safety.   Care Partner:  Caregiver Able to Provide Assistance: Yes  Type of Caregiver Assistance: Physical;Cognitive  Recommendation:  24 hour supervision/assistance;Home Health SLP  Rationale for  SLP Follow Up: Maximize swallowing safety   Equipment: N/A   Reasons for discharge: Treatment goals met;Discharged from hospital   Patient/Family Agrees with Progress Made and Goals Achieved: Yes   See FIM for current functional status  Pegeen Stiger 08/21/2012, 12:57 PM

## 2012-08-21 NOTE — Progress Notes (Addendum)
Social Work  Discharge Note  The overall goal for the admission was met for:   Discharge location: Yes - home with 24/7 private duty caregiver  Length of Stay: Yes - with d/c 08/22/12  Discharge activity level: Yes - supervision overall  Home/community participation: Yes  Services provided included: MD, RD, PT, OT, SLP, RN, TR, Pharmacy and SW  Financial Services: Medicare and Private Insurance: [employer]  Follow-up services arranged: Home Health: Charity fundraiser, PT, OT, SW via [facility], DME: semi-electric hospital bed via [facility], Other: Private Duty caregiver supplied by [facility] and Patient/Family has no preference for HH/DME agencies   - added HHST for swallowing follow up  Comments (or additional information): Have reviewed pt's current functional status and home support needs with [name], RN with [facility] and have requested close monitoring of this case as pt's history is to begin cutting back on hours of support very quickly and without good judgement.    Patient/Family verbalized understanding of follow-up arrangements: Yes  Individual responsible for coordination of the follow-up plan: patient  Confirmed correct DME delivered: [name] 08/21/2012    [name]

## 2012-08-21 NOTE — Progress Notes (Signed)
OT continues to recommend 24 supervision due to pt's fatigue with basic ADLs.  This note has been reviewed and this clinician agrees with information provided.

## 2012-08-21 NOTE — Progress Notes (Signed)
This note has been reviewed and this clinician agrees with information provided.  

## 2012-08-21 NOTE — Progress Notes (Signed)
Patient ID: [name], male   DOB: December 01, 1921, 78 y.o.   MRN: [id number] Subjective/Complaints: Constipation, no BM for several days   A 12 point review of systems has been performed and if not noted above is otherwise negative.   Objective: Vital Signs: Blood pressure 125/63, pulse 64, temperature 98.1 F (36.7 C), temperature source Oral, resp. rate 17, height 6' (1.829 m), weight 71.9 kg (158 lb 8.2 oz), SpO2 98.00%. No results found.  Basename 08/20/12 0624  WBC 4.8  HGB 8.7*  HCT 26.4*  PLT 363   No results found for this basename: NA:2,K:2,CL:2,CO2:2,GLUCOSE:2,BUN:2,CREATININE:2,CALCIUM:2 in the last 72 hours CBG (last 3)  No results found for this basename: GLUCAP:3 in the last 72 hours  Wt Readings from Last 3 Encounters:  08/21/12 71.9 kg (158 lb 8.2 oz)  08/12/12 63.4 kg (139 lb 12.4 oz)  05/08/12 63.504 kg (140 lb)    Physical Exam:  HENT:  Head: Normocephalic.  Eyes:  Pupils reactive to light  Neck: Neck supple. No thyromegaly present.  Cardiovascular:  Cardiac rate is controlled.Irreg Irreg No murmur or rub  Pulmonary/Chest:  Better air movement .Crackles R base Upper airway sounds heard. No distress .  RU chest wall is sore. Abdominal: Soft. Bowel sounds are normal. He exhibits no distension.  Musculoskeletal: He exhibits no edema.  Neurological: He is alert.  Patient was able to name person place and date of birth. He did need some cues for overall awareness. Fair insight. . He followed basic commands  Skin: Skin is warm and dry.  Motor strength 4 minus/5 in bilateral deltoid, biceps, and triceps, grip  4 minus/5 in bilateral hip flexors knee extensors ankle dorsiflexors and plantar flexors  Sensation intact to light touch in both upper and lower limbs. DTr's 1+.   Assessment/Plan: 1. Functional deficits secondary to deconditioning after respiratory and cardiac arrest D/C 11/9 after MD rounds if pt remain stable.   PCP f/u in 2 wks,  D/C instructions  per PA today   FIM: FIM - Bathing Bathing Steps Patient Completed: Chest;Right Arm;Left Arm;Abdomen;Front perineal area;Right upper leg;Left upper leg;Buttocks Bathing: 4: Min-Patient completes 8-9 51f 10 parts or 75+ percent  FIM - Upper Body Dressing/Undressing Upper body dressing/undressing steps patient completed: Thread/unthread left sleeve of pullover shirt/dress;Thread/unthread right sleeve of pullover shirt/dresss Upper body dressing/undressing: 3: Mod-Patient completed 50-74% of tasks (pt's hired caregiver provided assistance) FIM - Lower Body Dressing/Undressing Lower body dressing/undressing steps patient completed: Fasten/unfasten pants Lower body dressing/undressing: 1: Total-Patient completed less than 25% of tasks  FIM - Toileting Toileting steps completed by patient: Adjust clothing prior to toileting;Performs perineal hygiene;Adjust clothing after toileting Toileting: 1: Total-Patient completed zero steps, helper did all 3  FIM - Toilet Transfers Toilet Transfers: 2-To toilet/BSC: Max A (lift and lower assist);2-From toilet/BSC: Max A (lift and lower assist)  FIM - Banker Devices: Therapist, occupational: 5: Bed > Chair or W/C: Supervision (verbal cues/safety issues);5: Chair or W/C > Bed: Supervision (verbal cues/safety issues)  FIM - Locomotion: Wheelchair Distance: 150 Locomotion: Wheelchair: 2: Travels 150 ft or more: maneuvers on rugs and over door sills with maximal assistance (Pt: 25 - 49%) FIM - Locomotion: Ambulation Locomotion: Ambulation Assistive Devices: Designer, industrial/product Ambulation/Gait Assistance: 4: Min assist Locomotion: Ambulation: 2: Travels 50 - 149 ft with minimal assistance (Pt.>75%)  Comprehension Comprehension Mode: Auditory Comprehension: 5-Understands basic 90% of the time/requires cueing < 10% of the time  Expression Expression Mode: Verbal Expression: 5-Expresses basic  90% of the time/requires  cueing < 10% of the time.  Social Interaction Social Interaction: 5-Interacts appropriately 90% of the time - Needs monitoring or encouragement for participation or interaction.  Problem Solving Problem Solving: 5-Solves basic 90% of the time/requires cueing < 10% of the time  Memory Memory: 4-Recognizes or recalls 75 - 89% of the time/requires cueing 10 - 24% of the time Medical Problem List and Plan:  1. Deconditioning/pneumonia/respiratory failure and cardiac arrest  2. DVT Prophylaxis/Anticoagulation: Subcutaneous Lovenox. Monitor platelet counts and any signs of bleeding  3. Pain Management/chronic back pain: Oxycodone as needed. Monitor mental status as well as monitor with increased mobility   -added lidoderm for chest wall pain with some relief 4. Mood: Remeron 7.5 mg each bedtime. Provide emotional support and positive reinforcement  5. Neuropsych: This patient is capable of making decisions on his/her own behalf.  6. ID. 10 day course of Avelox completed D/C ,afeb, WBCs normal 7. Dysphagia. - diet upgraded to D3 thins with supervision  -  Watch closely for tolerance    -likely acute on chronic issues. Appreciate SLP help 8. Hypertension/ A. fib. Amiodarone 200 mg daily. Cardiac rate controlled and monitored  9. COPD. Continue nebulizers as advised. Monitor closely in light of pneumonia and hospital course respiratory failure. Check oxygen saturations every shift. Recent chest x-ray 08/11/2012 showing improvement right upper lobe aeration.  10. CHF. Monitor for any signs of fluid overload 11. imsomnia- trazodone helpful 12. Anemia: recheck Thursday  -hesitate adding Fe supplement given po intake      LOS (Days) 7 A FACE TO FACE EVALUATION WAS PERFORMED  Duston Smolenski E 08/21/2012, 9:57 AM

## 2012-08-22 NOTE — Progress Notes (Signed)
Patient ID: JOSHAU CODE, male   DOB: [birth year], 78 y.o.   MRN: [id number] Patient feels well. Has no complaints. He is eager for discharge.  Elderly male in no acute distress. Neck is supple. Chest clear to auscultation cardiac exam S1-S2 are regular. Abdominal exam active bowel sounds, soft.  Assessment and plan the patient is ready for discharge. Problem list reviewed.

## 2012-08-22 NOTE — Progress Notes (Signed)
Patient reports discharge instructions given yesterday by Deatra Ina, PA.  Reviewed discharge instructions with patient. Patient denies any questions or concerns.. Discharge instructions print out and prescriptions given to patient's personal care attendant. Personal attendant packed patient's personal belongings. Patient taken down by NT via wheelchair to private vehicle. [place], Marelin Tat Elon Jester

## 2012-09-03 ENCOUNTER — Encounter: Payer: Self-pay | Admitting: Pulmonary Disease

## 2012-09-03 ENCOUNTER — Ambulatory Visit (INDEPENDENT_AMBULATORY_CARE_PROVIDER_SITE_OTHER): Payer: Medicare Other | Admitting: Pulmonary Disease

## 2012-09-03 VITALS — BP 94/60 | HR 89 | Temp 98.3°F | Ht 66.0 in | Wt 153.4 lb

## 2012-09-03 DIAGNOSIS — G4700 Insomnia, unspecified: Secondary | ICD-10-CM

## 2012-09-03 NOTE — Patient Instructions (Addendum)
You have to get back into a sleep-wake cycle Encourage activity in daytime Avoid catnaps OK to take 60 minute nap in bed daily afternoon Light exposure x 30-60 mins daily Take melatonin 5mg  at 7pm If woken up at night, try light music or reading instead of TV Check oxygen levels during sleep

## 2012-09-03 NOTE — Progress Notes (Signed)
Subjective:    Patient ID: [name], male    DOB: [birth year], 78 y.o.   MRN: [id number]  HPI  78 year old  Man Presents for evaluation of nocturnal insomnia and excessive daytime somnolence. He has history of atrial fibrillation not on Coumadin secondary to risk of falls, syncope, and COPD with recent hospitalization in June 2013 for community-acquired pneumonia and CHF exacerbation. He was admitted August 08, 2012, with  3-days of worsening shortness of breath, productive cough, wheezing, fever & patchy bibasilar infiltrates, placed on broad-spectrum antibiotics. Noted on the evening of August 08, 2012, he was found to be pulseless & unresponsive,  in asystole, requiring ACLS ( down time of 9 minutes ) & mechanical ventilation. He then underwent a comprehensive rehab program & now is back to home with 24 hr nursing. He is able to carry out activities of daily living. As per his CNA who accompanies him, he takes frequent naps in his easy chair. Patient himself reports that he does not sleep but only 'rests'. But I note that he is falling asleep during the interview today. He is maintained on long-acting morphine and immediate release due to pain from chest compressions. Even prior to his recent hospitalization he has had this problem of not getting enough sleep at night and waking up frequently. He reports that he has never been a long sleeper. He has a hospital bed but has only used it once since discharge. Bedtime is 8 to 10 PM, sleep latency varies and can be up to 1 hour, he sleeps on his back with several pillows. He is several awakenings up to 10 a night and is up at 6 AM to start his day. His weight has not changed over the last few years. He has never had a sleep study. Again, he denies daytime somnolence and reports his Epworth sleepiness score as 5. But is nursing provider states otherwise. He is a retired Forensic scientist. He smoked 3 packs per day until he quit in 1955 Past Medical  History  Diagnosis Date  . Atrial fibrillation with slow ventricular response     Symptoms of weakness  . Paroxysmal atrial fibrillation     Hx of, currently on amiodarone load  . Syncope     Probably related to sinus pause, finally conversion from atrial fibrillation to sinus rhythm  . COPD (chronic obstructive pulmonary disease)   . Hypertension   . Abnormal CXR 07/06/2007; 06/15/2010    Eleveted R HD 07/06/2007 > no change 09/92/2011  . Chronic back pain   . Esophageal diverticulum 08/08/2012    Large R sided per esophagram    Past Surgical History  Procedure Date  . Joint replacement     No Known Allergies  History   Social History  . Marital Status: Divorced    Spouse Name: N/A    Number of Children: N/A  . Years of Education: N/A   Occupational History  . Retired Forensic scientist    Social History Main Topics  . Smoking status: Former Smoker -- 3.0 packs/day for 10 years    Quit date: 10/14/1953  . Smokeless tobacco: Not on file  . Alcohol Use: 3.5 oz/week    7 drink(s) per week  . Drug Use: No  . Sexually Active: Not on file   Other Topics Concern  . Not on file   Social History Narrative   DivorcedLives aloneHas children      Review of Systems  Constitutional: Positive for appetite change. Negative  for fever and unexpected weight change.  HENT: Negative for ear pain, congestion, sore throat, sneezing, trouble swallowing and dental problem.   Respiratory: Positive for shortness of breath.   Cardiovascular: Positive for leg swelling.  Gastrointestinal: Negative for abdominal pain.  Musculoskeletal: Positive for arthralgias. Negative for joint swelling.  Skin: Negative for rash.  Neurological: Negative for headaches.  Psychiatric/Behavioral: Negative for dysphoric mood. The patient is not nervous/anxious.        Objective:   Physical Exam  Gen. Pleasant, well-nourished man,in wheelchair,  in no distress, normal affect, appears sleepy & frequent  nodding off during the interview ENT - no lesions, no post nasal drip Neck: No JVD, no thyromegaly, no carotid bruits Lungs: kyphotic chest, no use of accessory muscles, no dullness to percussion, clear without rales or rhonchi  Cardiovascular: Rhythm regular, heart sounds  normal, no murmurs or gallops, 1+ peripheral edema Abdomen: soft and non-tender, no hepatosplenomegaly, BS normal. Musculoskeletal: No deformities, no cyanosis or clubbing Neuro:  alert, non focal       Assessment & Plan:

## 2012-09-03 NOTE — Assessment & Plan Note (Addendum)
Paradoxically does seem to have excessive daytime somnolence. Due to medical condition, chronic pain & circadian rhythm disruption You have to get back into a sleep-wake cycle Encourage activity in daytime Avoid catnaps OK to take 60 minute nap in bed daily afternoon Light exposure x 30-60 mins daily Take melatonin 5mg  at 7pm If woken up at night, try light music or reading instead of TV Check oxygen levels during sleep, I doubt that an activity counter would be of much benefit since he is only active from bed to chair. I would discourage use of hypnotics  hereunless we are clearly aiming for comfort .

## 2012-09-11 ENCOUNTER — Ambulatory Visit (INDEPENDENT_AMBULATORY_CARE_PROVIDER_SITE_OTHER): Payer: Medicare Other | Admitting: Internal Medicine

## 2012-09-11 ENCOUNTER — Encounter: Payer: Self-pay | Admitting: Internal Medicine

## 2012-09-11 VITALS — BP 144/70 | HR 87 | Ht 66.0 in | Wt 150.0 lb

## 2012-09-11 DIAGNOSIS — R001 Bradycardia, unspecified: Secondary | ICD-10-CM

## 2012-09-11 DIAGNOSIS — I498 Other specified cardiac arrhythmias: Secondary | ICD-10-CM

## 2012-09-11 NOTE — Assessment & Plan Note (Signed)
The absence of clinical symptoms apart from the isolated episode in the context of the isolated episode continued to support the diagnosis of coughing/choking due to vagal bradycardia. No further workup is indicated at this time. The event that he would have recurrent symptoms of lightheadedness or dizziness, it recorder would be appropriate. We'll follow up with Dr. DM 3 months

## 2012-09-11 NOTE — Patient Instructions (Addendum)
Your physician recommends that you schedule a follow-up appointment in: 12/08/12 @ 10:15 WITH DR. Shirlee Latch  NO CHANGES WERE MADE TODAY

## 2012-09-11 NOTE — Progress Notes (Signed)
Patient Care Team: Hal Stormy Fabian, MD as PCP - General (Internal Medicine)   HPI  [name] is a 77 y.o. male Seen with a history of atrial fibrillation for which she takes amiodarone. 3 recent hospitalization for an episode telemetry where he had significant bradycardia requiring intubation. It is my impression that this followed a coughing choking episode. No prior history of bradycardia had been noted.  No hx of syncope or presyncope Chronic LE edema     Past Medical History  Diagnosis Date  . Atrial fibrillation with slow ventricular response     Symptoms of weakness  . Paroxysmal atrial fibrillation     Hx of, currently on amiodarone load  . Syncope     Probably related to sinus pause, finally conversion from atrial fibrillation to sinus rhythm  . COPD (chronic obstructive pulmonary disease)   . Hypertension   . Abnormal CXR 07/06/2007; 06/15/2010    Eleveted R HD 07/06/2007 > no change 09/92/2011  . Chronic back pain   . Esophageal diverticulum 08/08/2012    Large R sided per esophagram    Past Surgical History  Procedure Date  . Joint replacement     Current Outpatient Prescriptions  Medication Sig Dispense Refill  . amiodarone (PACERONE) 200 MG tablet Take 1 tablet (200 mg total) by mouth daily.  30 tablet  1  . aspirin 81 MG tablet Take 81 mg by mouth daily.      . budesonide (PULMICORT) 0.25 MG/2ML nebulizer solution Take 2 mLs (0.25 mg total) by nebulization 2 (two) times daily.  60 mL    . Calcium Carb-Cholecalciferol (CALCIUM + D3) 600-200 MG-UNIT TABS Take 1 tablet by mouth daily.      . Cholecalciferol (VITAMIN D-3) 1000 UNITS CAPS Take by mouth. Take once daily      . doxepin (SINEQUAN) 10 MG capsule Take 10 mg by mouth daily.      Marland Kitchen dutasteride (AVODART) 0.5 MG capsule Take 0.5 mg by mouth daily.      . Melatonin 5 MG TABS Take 5 mg by mouth daily.      . mirtazapine (REMERON) 7.5 MG tablet Take 1 tablet (7.5 mg total) by mouth at bedtime.      Marland Kitchen  omeprazole (PRILOSEC) 20 MG capsule Take 20 mg by mouth daily.      Marland Kitchen oxyCODONE (OXY IR/ROXICODONE) 5 MG immediate release tablet Take 5 mg by mouth as needed.      Marland Kitchen oxycodone (ROXICODONE) 30 MG immediate release tablet Take 30 mg by mouth 3 (three) times daily.      . polyethylene glycol (MIRALAX / GLYCOLAX) packet Take 17 g by mouth daily.      . sodium chloride (OCEAN) 0.65 % nasal spray Place 2 sprays into the nose 3 (three) times daily as needed. For dryness.      . Tamsulosin HCl (FLOMAX) 0.4 MG CAPS Take 0.4 mg by mouth daily.      . temazepam (RESTORIL) 30 MG capsule Take 30 mg by mouth at bedtime.        No Known Allergies  Review of Systems negative except from HPI and PMH  Physical Exam BP 144/70  Pulse 87  Ht 5\' 6"  (1.676 m)  Wt 150 lb (68.04 kg)  BMI 24.21 kg/m2  SpO2 96% Well developed and well nourished in no acute distress HENT normal E scleral and icterus clear Neck Supple JVP<10 ; carotids brisk and full Clear to ausculation Irregular rate and rhythm,  Soft with active bowel sounds No clubbing cyanosis 2+ Edema Alert and oriented, grossly normal motor and sensory function Skin Warm and Dry    Assessment and  Plan

## 2012-09-17 ENCOUNTER — Telehealth: Payer: Self-pay | Admitting: Pulmonary Disease

## 2012-09-17 NOTE — Telephone Encounter (Signed)
ONO reviewde -only 4h test time desatn x 16 mins Does not need oxygen

## 2012-09-17 NOTE — Telephone Encounter (Signed)
I spoke with patient about results and he verbalized understanding and had no questions 

## 2012-09-29 ENCOUNTER — Encounter: Payer: Self-pay | Admitting: Pulmonary Disease

## 2012-12-08 ENCOUNTER — Encounter: Payer: Self-pay | Admitting: Cardiology

## 2012-12-08 ENCOUNTER — Ambulatory Visit (INDEPENDENT_AMBULATORY_CARE_PROVIDER_SITE_OTHER): Payer: Medicare Other | Admitting: Cardiology

## 2012-12-08 VITALS — BP 110/62 | HR 73 | Ht 66.0 in | Wt 159.0 lb

## 2012-12-08 DIAGNOSIS — I469 Cardiac arrest, cause unspecified: Secondary | ICD-10-CM | POA: Insufficient documentation

## 2012-12-08 DIAGNOSIS — I509 Heart failure, unspecified: Secondary | ICD-10-CM

## 2012-12-08 DIAGNOSIS — I5032 Chronic diastolic (congestive) heart failure: Secondary | ICD-10-CM

## 2012-12-08 DIAGNOSIS — R5381 Other malaise: Secondary | ICD-10-CM

## 2012-12-08 DIAGNOSIS — I4891 Unspecified atrial fibrillation: Secondary | ICD-10-CM

## 2012-12-08 LAB — TSH: TSH: 2.13 u[IU]/mL (ref 0.35–5.50)

## 2012-12-08 LAB — BASIC METABOLIC PANEL
BUN: 18 mg/dL (ref 6–23)
CO2: 29 mEq/L (ref 19–32)
Chloride: 103 mEq/L (ref 96–112)
Potassium: 3.9 mEq/L (ref 3.5–5.1)

## 2012-12-08 LAB — HEPATIC FUNCTION PANEL
Albumin: 3.6 g/dL (ref 3.5–5.2)
Alkaline Phosphatase: 126 U/L — ABNORMAL HIGH (ref 39–117)
Total Bilirubin: 0.9 mg/dL (ref 0.3–1.2)

## 2012-12-08 NOTE — Progress Notes (Signed)
Patient ID: [name], male   DOB: Mar 10, 1922, 79 y.o.   MRN: [id number] PCP: Dr. Pete Glatter  79 yo with history of atrial fibrillation presents for followup.  The atrial fibrillation is paroxysmal.  Today he is in atrial fibrillation with controlled rate.  At last visit in this office, he was in NSR.  He is no longer on coumadin due to fall risk.  He has poor mobility due to his back problems.  He is in his wheelchair most of the time and can transfer to a chair or bed.    Patient was admitted in 10/13 with PNA.  In the setting of violent coughing, he developed a bradycardic arrest and required epinephrine x 2 and CPR with return of spontaneous circulation.  This was thought to be a vagally-mediated event. Since that time, no further syncope or lightheadedness.  He is able to ride his stationary bike for about 15 minutes/day.  No dyspnea riding the bike.  No chest pain.  Weight has remained stable.     ECG: coarse atrial fibrillation, RBBB, ? Old inferior MI.   Labs (11/13): K 3.9, creatinine 0.85, LFTs normal  PMH: 1. Atrial fibrillation: Paroxysmal.  He is on amiodarone.  No coumadin due to fall risk.  Echo (12/05): EF 55-65%, mild aortic insufficiency, mild LV hypertrophy.  2. HTN 3. COPD: Heavy smoker in past but quit in 1965.  4. Syncope/bradycardic arrest: 10/13 in setting of violent coughing.  Thought to be a vagal event.  5. Right TKR in 2010.  6. Inguinal hernia repair. 7. Kyphosis with chronic back pain.   SH: Divorced, lives alone.  Smoked up to 3 ppd but quit in 1965.  Retired Forensic scientist.  Confined to wheelchair.   FH: Noncontributory.   Current Outpatient Prescriptions  Medication Sig Dispense Refill  . amiodarone (PACERONE) 200 MG tablet Take 1 tablet (200 mg total) by mouth daily.  30 tablet  1  . aspirin 81 MG tablet Take 81 mg by mouth daily.      . Calcium Carb-Cholecalciferol (CALCIUM + D3) 600-200 MG-UNIT TABS Take 1 tablet by mouth daily.      .  Cholecalciferol (VITAMIN D-3) 1000 UNITS CAPS Take by mouth. Take once daily      . doxepin (SINEQUAN) 10 MG capsule Take 10 mg by mouth daily.      Marland Kitchen dutasteride (AVODART) 0.5 MG capsule Take 0.5 mg by mouth daily.      . Melatonin 5 MG TABS Take 5 mg by mouth daily.      . mirtazapine (REMERON) 7.5 MG tablet Take 1 tablet (7.5 mg total) by mouth at bedtime.      Marland Kitchen omeprazole (PRILOSEC) 20 MG capsule Take 20 mg by mouth daily.      Marland Kitchen oxycodone (ROXICODONE) 30 MG immediate release tablet Take 60 mg by mouth 3 (three) times daily.       . polyethylene glycol (MIRALAX / GLYCOLAX) packet Take 17 g by mouth daily.      . sodium chloride (OCEAN) 0.65 % nasal spray Place 2 sprays into the nose 3 (three) times daily as needed. For dryness.      . Tamsulosin HCl (FLOMAX) 0.4 MG CAPS Take 0.4 mg by mouth daily.      . temazepam (RESTORIL) 30 MG capsule Take 30 mg by mouth at bedtime.      . furosemide (LASIX) 20 MG tablet Every other day       No current facility-administered medications  for this visit.    BP 110/62  Pulse 73  Ht 5\' 6"  (1.676 m)  Wt 159 lb (72.122 kg)  BMI 25.68 kg/m2 General: NAD, kyphotic, frail Neck: JVP 7 cm, no thyromegaly or thyroid nodule.  Lungs: Dry crackles at the bases bilaterally. CV: Nondisplaced PMI.  Heart irregular S1/S2, no S3/S4, no murmur.  1+ edema 1/2 to knees bilaterally. No carotid bruit.  Normal pedal pulses.  Abdomen: Soft, nontender, no hepatosplenomegaly, no distention.  Neurologic: Alert and oriented x 3.  Psych: Normal affect. Extremities: No clubbing or cyanosis.   Assessment/Plan: 1. Atrial fibrillation: Paroxysmal.  He is in rate controlled atrial fibrillation today.  At last visit in this office he was in NSR.  He does not notice that he is in atrial fibrillation.  No change to therapy today.  Continue amiodarone.  If he proves to be in persistent atrial fibrillation would consider replacing amiodarone with Toprol XL.  He is not on coumadin due  to fall risk.  He needs LFTs and TSH today given amiodarone use.  2. Diastolic CHF: Chronic.  Some edema but no JVD.  Continue current Lasix dosing. Check BMET.  3. Deconditioning: Patient has very limited mobility due to back pain and is basically confined to his wheelchair.  I encouraged him to continue to use his exercise bike.  4. Bradycardic arrest: Patient had a bradycardic arrest associated with violent coughing in the setting of PNA in 10/13.  He required CPR and epinephrine.  No recurrence of syncope or lightheadedness.  I suspect this was a vagally mediated event in the settting of coughing.   Marca Ancona 12/08/2012

## 2012-12-08 NOTE — Patient Instructions (Addendum)
Your physician recommends that you return for lab work today---liver profile/TSH/BMET.  Your physician wants you to follow-up in: 6 months with Dr Shirlee Latch. (August 2014). You will receive a reminder letter in the mail two months in advance. If you don't receive a letter, please call our office to schedule the follow-up appointment.

## 2013-04-30 IMAGING — CR DG CHEST 1V PORT
1 series · 1 of 1 positions shown · non-contrast
Comparison: 08/09/2012 and prior chest radiographs

CLINICAL DATA: Endotracheal tube replacement. Respiratory and heart
failure, COPD, shortness of breath.

PORTABLE CHEST - 1 VIEW

[AP]
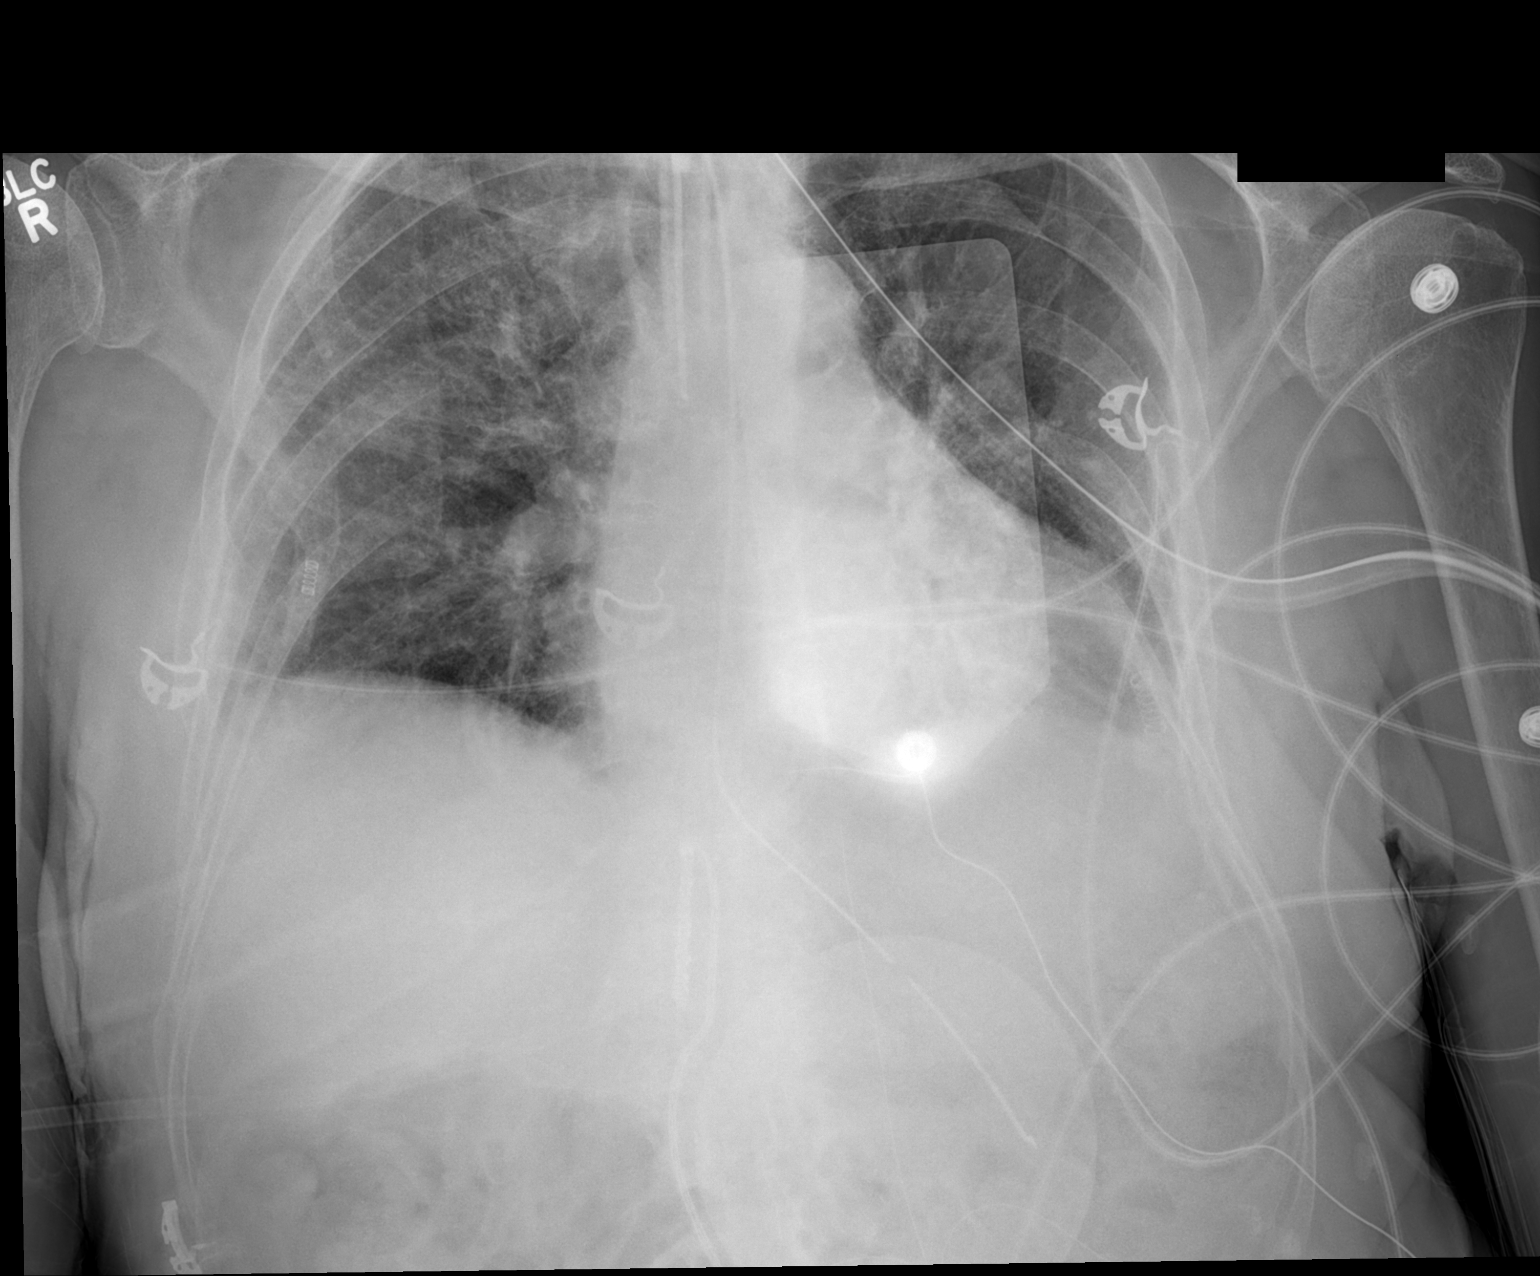

[1 of 1 positions shown; findings below may reference images not displayed]

FINDINGS: Upper limits normal heart size noted.
An endotracheal tube is identified 1.5 cm above the carina.
Mild interstitial opacities are again identified.
Left lower lung retrocardiac opacity is again identified - question
atelectasis versus airspace disease.
An NG tube is identified with tip overlying the mid stomach.
Other support apparatus are unchanged.
There is no evidence of pneumothorax.
IMPRESSION: Endotracheal tube tip 1.5 cm above the carina, otherwise unchanged
chest radiograph as described.

## 2013-05-01 IMAGING — CR DG CHEST 1V PORT
1 series · 1 of 1 positions shown · non-contrast
Comparison: Chest 08/09/2012.

CLINICAL DATA: Endotracheal tube placement.

PORTABLE CHEST - 1 VIEW

[AP]
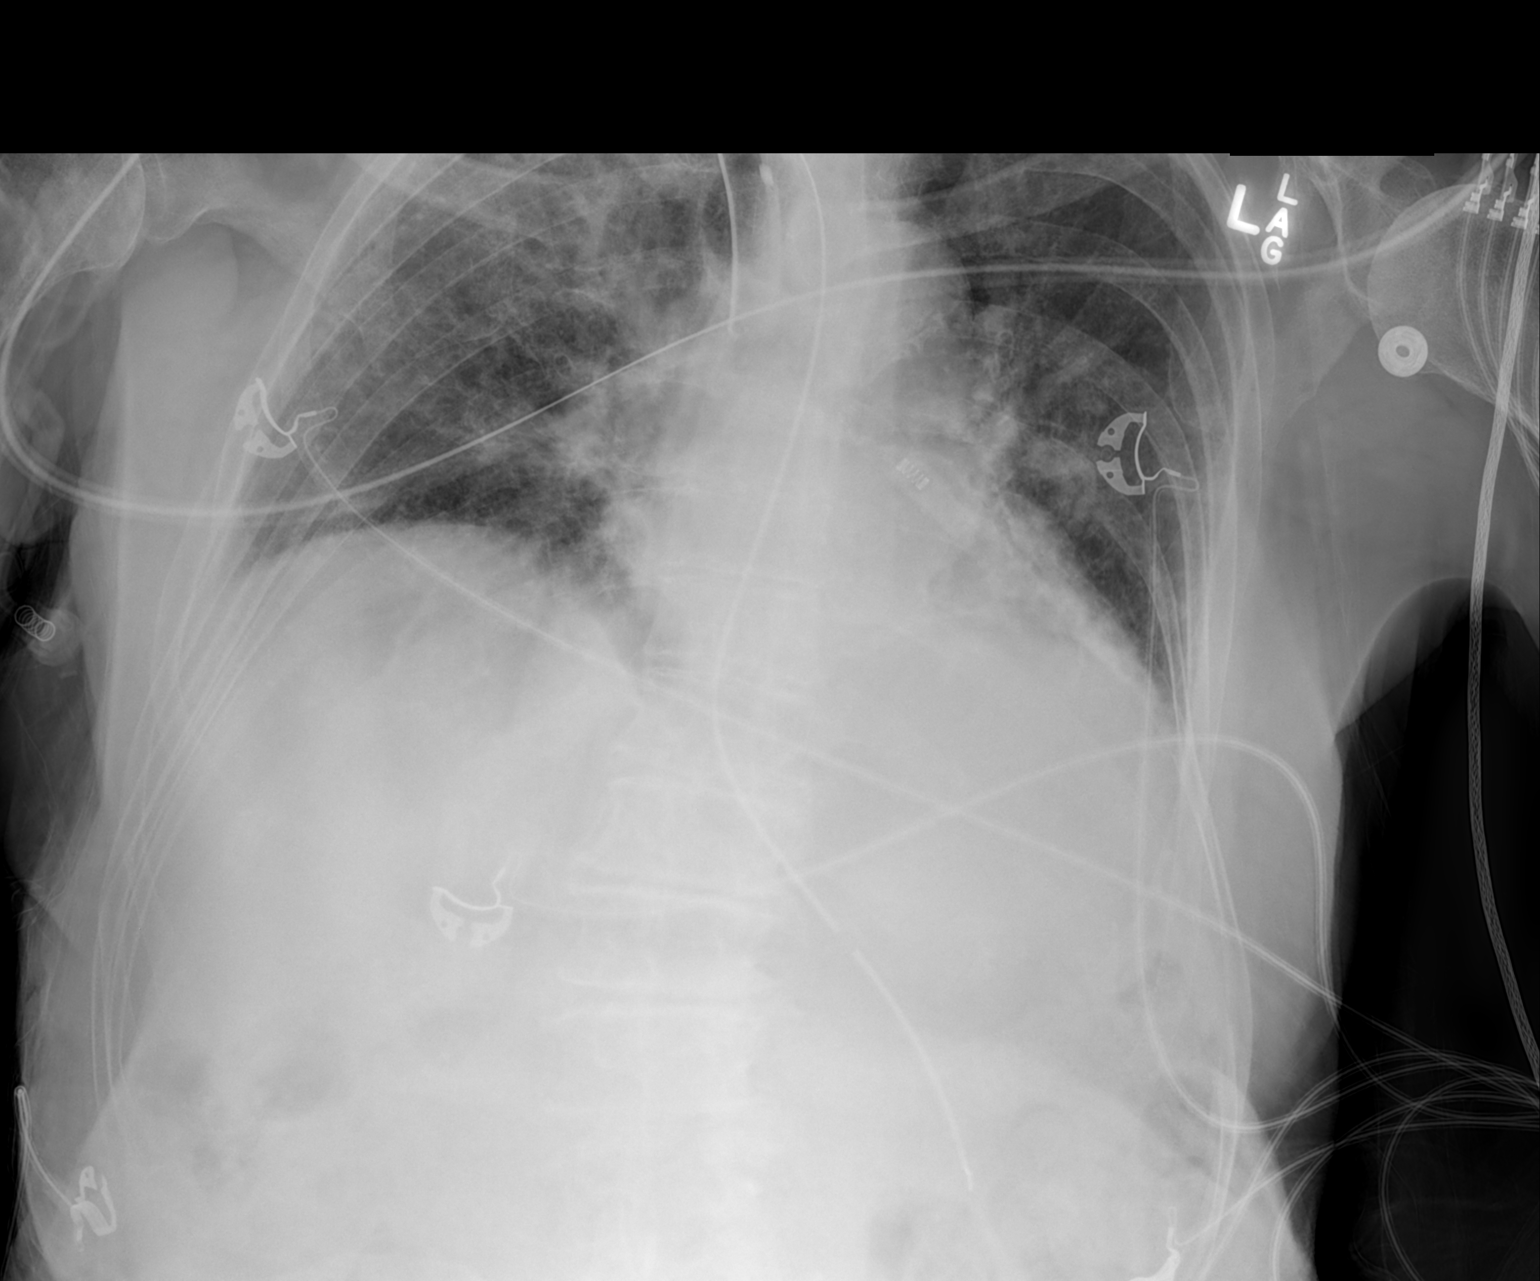

[1 of 1 positions shown; findings below may reference images not displayed]

FINDINGS: Endotracheal tube is unchanged in position at the level
of the carina.  Lung volumes are low.  Right upper and left lower
lobe airspace opacities appear unchanged.  Heart size is normal.
No pneumothorax or pleural fluid.
IMPRESSION: 1.  Endotracheal tube tip is at the carina.  Recommend withdrawal
of 2 cm.
2.  No change in right upper and left lower lobe airspace disease
which could be due to asymmetric edema or pneumonia.

## 2013-08-02 NOTE — Progress Notes (Signed)
This note has been reviewed and this clinician agrees with information provided.  

## 2013-12-12 ENCOUNTER — Emergency Department (HOSPITAL_COMMUNITY): Payer: Medicare Other

## 2013-12-12 ENCOUNTER — Inpatient Hospital Stay (HOSPITAL_COMMUNITY)
Admission: EM | Admit: 2013-12-12 | Discharge: 2013-12-17 | DRG: 871 | Disposition: A | Discharge status: Nursing Home (Includes ICF) | Payer: Medicare Other | Attending: Internal Medicine | Admitting: Internal Medicine

## 2013-12-12 ENCOUNTER — Encounter (HOSPITAL_COMMUNITY): Payer: Self-pay | Admitting: Emergency Medicine

## 2013-12-12 ENCOUNTER — Inpatient Hospital Stay (HOSPITAL_COMMUNITY): Payer: Medicare Other

## 2013-12-12 DIAGNOSIS — Z8674 Personal history of sudden cardiac arrest: Secondary | ICD-10-CM

## 2013-12-12 DIAGNOSIS — I5032 Chronic diastolic (congestive) heart failure: Secondary | ICD-10-CM | POA: Diagnosis present

## 2013-12-12 DIAGNOSIS — I495 Sick sinus syndrome: Secondary | ICD-10-CM | POA: Diagnosis present

## 2013-12-12 DIAGNOSIS — R34 Anuria and oliguria: Secondary | ICD-10-CM | POA: Diagnosis present

## 2013-12-12 DIAGNOSIS — Z993 Dependence on wheelchair: Secondary | ICD-10-CM

## 2013-12-12 DIAGNOSIS — I452 Bifascicular block: Secondary | ICD-10-CM | POA: Diagnosis present

## 2013-12-12 DIAGNOSIS — F0390 Unspecified dementia without behavioral disturbance: Secondary | ICD-10-CM | POA: Diagnosis present

## 2013-12-12 DIAGNOSIS — R6521 Severe sepsis with septic shock: Secondary | ICD-10-CM

## 2013-12-12 DIAGNOSIS — I4510 Unspecified right bundle-branch block: Secondary | ICD-10-CM | POA: Diagnosis present

## 2013-12-12 DIAGNOSIS — J4489 Other specified chronic obstructive pulmonary disease: Secondary | ICD-10-CM | POA: Diagnosis present

## 2013-12-12 DIAGNOSIS — Z7982 Long term (current) use of aspirin: Secondary | ICD-10-CM

## 2013-12-12 DIAGNOSIS — A419 Sepsis, unspecified organism: Secondary | ICD-10-CM | POA: Diagnosis present

## 2013-12-12 DIAGNOSIS — E8779 Other fluid overload: Secondary | ICD-10-CM | POA: Diagnosis present

## 2013-12-12 DIAGNOSIS — J9600 Acute respiratory failure, unspecified whether with hypoxia or hypercapnia: Secondary | ICD-10-CM | POA: Diagnosis present

## 2013-12-12 DIAGNOSIS — E876 Hypokalemia: Secondary | ICD-10-CM | POA: Diagnosis present

## 2013-12-12 DIAGNOSIS — I4891 Unspecified atrial fibrillation: Secondary | ICD-10-CM | POA: Diagnosis present

## 2013-12-12 DIAGNOSIS — J189 Pneumonia, unspecified organism: Secondary | ICD-10-CM | POA: Diagnosis present

## 2013-12-12 DIAGNOSIS — D649 Anemia, unspecified: Secondary | ICD-10-CM

## 2013-12-12 DIAGNOSIS — Z79899 Other long term (current) drug therapy: Secondary | ICD-10-CM

## 2013-12-12 DIAGNOSIS — R509 Fever, unspecified: Secondary | ICD-10-CM

## 2013-12-12 DIAGNOSIS — I509 Heart failure, unspecified: Secondary | ICD-10-CM | POA: Diagnosis present

## 2013-12-12 DIAGNOSIS — M549 Dorsalgia, unspecified: Secondary | ICD-10-CM | POA: Diagnosis present

## 2013-12-12 DIAGNOSIS — I10 Essential (primary) hypertension: Secondary | ICD-10-CM | POA: Diagnosis present

## 2013-12-12 DIAGNOSIS — R1310 Dysphagia, unspecified: Secondary | ICD-10-CM | POA: Diagnosis present

## 2013-12-12 DIAGNOSIS — G8929 Other chronic pain: Secondary | ICD-10-CM | POA: Diagnosis present

## 2013-12-12 DIAGNOSIS — K225 Diverticulum of esophagus, acquired: Secondary | ICD-10-CM | POA: Diagnosis present

## 2013-12-12 DIAGNOSIS — J690 Pneumonitis due to inhalation of food and vomit: Secondary | ICD-10-CM | POA: Diagnosis present

## 2013-12-12 DIAGNOSIS — J449 Chronic obstructive pulmonary disease, unspecified: Secondary | ICD-10-CM | POA: Diagnosis present

## 2013-12-12 DIAGNOSIS — Z888 Allergy status to other drugs, medicaments and biological substances status: Secondary | ICD-10-CM

## 2013-12-12 DIAGNOSIS — R6520 Severe sepsis without septic shock: Secondary | ICD-10-CM

## 2013-12-12 DIAGNOSIS — D539 Nutritional anemia, unspecified: Secondary | ICD-10-CM | POA: Diagnosis present

## 2013-12-12 DIAGNOSIS — F3289 Other specified depressive episodes: Secondary | ICD-10-CM | POA: Diagnosis present

## 2013-12-12 DIAGNOSIS — F329 Major depressive disorder, single episode, unspecified: Secondary | ICD-10-CM | POA: Diagnosis present

## 2013-12-12 DIAGNOSIS — R001 Bradycardia, unspecified: Secondary | ICD-10-CM | POA: Diagnosis present

## 2013-12-12 DIAGNOSIS — I440 Atrioventricular block, first degree: Secondary | ICD-10-CM | POA: Diagnosis present

## 2013-12-12 DIAGNOSIS — Z87891 Personal history of nicotine dependence: Secondary | ICD-10-CM

## 2013-12-12 DIAGNOSIS — R578 Other shock: Secondary | ICD-10-CM | POA: Diagnosis present

## 2013-12-12 DIAGNOSIS — R112 Nausea with vomiting, unspecified: Secondary | ICD-10-CM | POA: Diagnosis present

## 2013-12-12 DIAGNOSIS — R339 Retention of urine, unspecified: Secondary | ICD-10-CM | POA: Diagnosis present

## 2013-12-12 LAB — COMPREHENSIVE METABOLIC PANEL
ALBUMIN: 3.5 g/dL (ref 3.5–5.2)
ALK PHOS: 92 U/L (ref 39–117)
ALT: 16 U/L (ref 0–53)
AST: 18 U/L (ref 0–37)
BUN: 30 mg/dL — ABNORMAL HIGH (ref 6–23)
CALCIUM: 9.3 mg/dL (ref 8.4–10.5)
CO2: 27 mEq/L (ref 19–32)
Chloride: 98 mEq/L (ref 96–112)
Creatinine, Ser: 0.69 mg/dL (ref 0.50–1.35)
GFR calc non Af Amer: 82 mL/min — ABNORMAL LOW (ref >90)
Glucose, Bld: 100 mg/dL — ABNORMAL HIGH (ref 70–99)
POTASSIUM: 4.3 meq/L (ref 3.7–5.3)
SODIUM: 140 meq/L (ref 137–147)
TOTAL PROTEIN: 7.2 g/dL (ref 6.0–8.3)
Total Bilirubin: 0.9 mg/dL (ref 0.3–1.2)

## 2013-12-12 LAB — CBC WITH DIFFERENTIAL/PLATELET
BASOS ABS: 0 10*3/uL (ref 0.0–0.1)
Basophils Relative: 0 % (ref 0–1)
EOS ABS: 0 10*3/uL (ref 0.0–0.7)
Eosinophils Relative: 0 % (ref 0–5)
HCT: 26.5 % — ABNORMAL LOW (ref 39.0–52.0)
HEMOGLOBIN: 9.1 g/dL — AB (ref 13.0–17.0)
LYMPHS PCT: 8 % — AB (ref 12–46)
Lymphs Abs: 0.8 10*3/uL (ref 0.7–4.0)
MCH: 39.9 pg — AB (ref 26.0–34.0)
MCHC: 34.3 g/dL (ref 30.0–36.0)
MCV: 116.2 fL — ABNORMAL HIGH (ref 78.0–100.0)
MONO ABS: 0.9 10*3/uL (ref 0.1–1.0)
Monocytes Relative: 9 % (ref 3–12)
NEUTROS PCT: 83 % — AB (ref 43–77)
Neutro Abs: 7.9 10*3/uL — ABNORMAL HIGH (ref 1.7–7.7)
PLATELETS: 542 10*3/uL — AB (ref 150–400)
RBC: 2.28 MIL/uL — ABNORMAL LOW (ref 4.22–5.81)
RDW: 16.1 % — AB (ref 11.5–15.5)
WBC: 9.6 10*3/uL (ref 4.0–10.5)

## 2013-12-12 LAB — URINALYSIS, ROUTINE W REFLEX MICROSCOPIC
BILIRUBIN URINE: NEGATIVE
GLUCOSE, UA: NEGATIVE mg/dL
Hgb urine dipstick: NEGATIVE
KETONES UR: NEGATIVE mg/dL
LEUKOCYTES UA: NEGATIVE
NITRITE: NEGATIVE
PH: 5 (ref 5.0–8.0)
Protein, ur: NEGATIVE mg/dL
SPECIFIC GRAVITY, URINE: 1.018 (ref 1.005–1.030)
Urobilinogen, UA: 1 mg/dL (ref 0.0–1.0)

## 2013-12-12 LAB — MRSA PCR SCREENING: MRSA BY PCR: NEGATIVE

## 2013-12-12 LAB — LIPASE, BLOOD: Lipase: 17 U/L (ref 11–59)

## 2013-12-12 LAB — TROPONIN I: Troponin I: <0.3 ng/mL (ref <0.30)

## 2013-12-12 LAB — INFLUENZA PANEL BY PCR (TYPE A & B)
H1N1 flu by pcr: NOT DETECTED
INFLBPCR: NEGATIVE
Influenza A By PCR: NEGATIVE

## 2013-12-12 LAB — HIV ANTIBODY (ROUTINE TESTING W REFLEX): HIV: NONREACTIVE

## 2013-12-12 LAB — I-STAT CG4 LACTIC ACID, ED: Lactic Acid, Venous: 2.07 mmol/L (ref 0.5–2.2)

## 2013-12-12 LAB — LACTIC ACID, PLASMA: Lactic Acid, Venous: 1.1 mmol/L (ref 0.5–2.2)

## 2013-12-12 LAB — STREP PNEUMONIAE URINARY ANTIGEN: Strep Pneumo Urinary Antigen: NEGATIVE

## 2013-12-12 MED ORDER — SODIUM CHLORIDE 0.9 % IV SOLN: INTRAVENOUS | Status: DC

## 2013-12-12 MED ORDER — ONDANSETRON HCL 4 MG/2ML IJ SOLN: 4.0000 mg | Freq: Four times a day (QID) | INTRAMUSCULAR | Status: DC | PRN

## 2013-12-12 MED ORDER — DONEPEZIL HCL 5 MG PO TABS
5.0000 mg | ORAL_TABLET | Freq: Every day | ORAL | Status: DC
  Administered 2013-12-12 – 2013-12-16 (×5): 5 mg via ORAL
  Filled 2013-12-12 (×6): qty 1

## 2013-12-12 MED ORDER — POLYETHYLENE GLYCOL 3350 17 G PO PACK
17.0000 g | PACK | Freq: Every day | ORAL | Status: DC | PRN
  Filled 2013-12-12: qty 1

## 2013-12-12 MED ORDER — DUTASTERIDE 0.5 MG PO CAPS
0.5000 mg | ORAL_CAPSULE | Freq: Every day | ORAL | Status: DC
  Administered 2013-12-13 – 2013-12-17 (×5): 0.5 mg via ORAL
  Filled 2013-12-12 (×5): qty 1

## 2013-12-12 MED ORDER — DIVALPROEX SODIUM 125 MG PO CPSP
125.0000 mg | ORAL_CAPSULE | Freq: Two times a day (BID) | ORAL | Status: DC
  Administered 2013-12-12 – 2013-12-17 (×10): 125 mg via ORAL
  Filled 2013-12-12 (×11): qty 1

## 2013-12-12 MED ORDER — SODIUM CHLORIDE 0.9 % IV BOLUS (SEPSIS)
500.0000 mL | Freq: Once | INTRAVENOUS | Status: AC
  Administered 2013-12-12: 500 mL via INTRAVENOUS

## 2013-12-12 MED ORDER — ISOPROTERENOL HCL 0.2 MG/ML IJ SOLN: 2.0000 ug/min | INTRAVENOUS | Status: DC

## 2013-12-12 MED ORDER — SENNA 8.6 MG PO TABS
2.0000 | ORAL_TABLET | Freq: Every day | ORAL | Status: DC
  Administered 2013-12-13 – 2013-12-16 (×4): 17.2 mg via ORAL
  Filled 2013-12-12 (×5): qty 2

## 2013-12-12 MED ORDER — PIPERACILLIN-TAZOBACTAM 3.375 G IVPB 30 MIN
3.3750 g | Freq: Once | INTRAVENOUS | Status: AC
  Administered 2013-12-12: 3.375 g via INTRAVENOUS
  Filled 2013-12-12: qty 50

## 2013-12-12 MED ORDER — ENOXAPARIN SODIUM 40 MG/0.4ML ~~LOC~~ SOLN
40.0000 mg | Freq: Every day | SUBCUTANEOUS | Status: DC
  Administered 2013-12-12 – 2013-12-14 (×3): 40 mg via SUBCUTANEOUS
  Filled 2013-12-12 (×4): qty 0.4

## 2013-12-12 MED ORDER — DOCUSATE SODIUM 100 MG PO CAPS
100.0000 mg | ORAL_CAPSULE | Freq: Two times a day (BID) | ORAL | Status: DC
  Administered 2013-12-12 – 2013-12-17 (×10): 100 mg via ORAL
  Filled 2013-12-12 (×11): qty 1

## 2013-12-12 MED ORDER — DOPAMINE-DEXTROSE 3.2-5 MG/ML-% IV SOLN
2.0000 ug/kg/min | INTRAVENOUS | Status: DC
  Administered 2013-12-12: 5 ug/kg/min via INTRAVENOUS
  Administered 2013-12-13: 7 ug/kg/min via INTRAVENOUS
  Administered 2013-12-13: 7.5 ug/kg/min via INTRAVENOUS
  Administered 2013-12-13: 8.267 ug/kg/min via INTRAVENOUS
  Administered 2013-12-13: 9.159 ug/kg/min via INTRAVENOUS
  Filled 2013-12-12: qty 250

## 2013-12-12 MED ORDER — SODIUM CHLORIDE 0.9 % IV SOLN
INTRAVENOUS | Status: AC
  Administered 2013-12-12: 18:00:00 via INTRAVENOUS

## 2013-12-12 MED ORDER — FLUTICASONE PROPIONATE HFA 110 MCG/ACT IN AERO
1.0000 | INHALATION_SPRAY | Freq: Two times a day (BID) | RESPIRATORY_TRACT | Status: DC
  Administered 2013-12-12 – 2013-12-17 (×10): 1 via RESPIRATORY_TRACT
  Filled 2013-12-12: qty 12

## 2013-12-12 MED ORDER — SODIUM CHLORIDE 0.9 % IV BOLUS (SEPSIS)
250.0000 mL | Freq: Once | INTRAVENOUS | Status: AC
  Administered 2013-12-12: 250 mL via INTRAVENOUS

## 2013-12-12 MED ORDER — TAMSULOSIN HCL 0.4 MG PO CAPS
0.4000 mg | ORAL_CAPSULE | Freq: Every day | ORAL | Status: DC
  Administered 2013-12-12 – 2013-12-17 (×6): 0.4 mg via ORAL
  Filled 2013-12-12 (×6): qty 1

## 2013-12-12 MED ORDER — MIRTAZAPINE 7.5 MG PO TABS
7.5000 mg | ORAL_TABLET | Freq: Every day | ORAL | Status: DC
  Administered 2013-12-12 – 2013-12-16 (×5): 7.5 mg via ORAL
  Filled 2013-12-12 (×6): qty 1

## 2013-12-12 MED ORDER — SERTRALINE HCL 50 MG PO TABS
50.0000 mg | ORAL_TABLET | Freq: Every day | ORAL | Status: DC
  Administered 2013-12-13 – 2013-12-17 (×5): 50 mg via ORAL
  Filled 2013-12-12 (×5): qty 1

## 2013-12-12 MED ORDER — OSELTAMIVIR PHOSPHATE 30 MG PO CAPS
30.0000 mg | ORAL_CAPSULE | Freq: Two times a day (BID) | ORAL | Status: DC
  Administered 2013-12-12 – 2013-12-13 (×2): 30 mg via ORAL
  Filled 2013-12-12 (×3): qty 1

## 2013-12-12 MED ORDER — PIPERACILLIN-TAZOBACTAM 3.375 G IVPB
3.3750 g | Freq: Three times a day (TID) | INTRAVENOUS | Status: DC
  Administered 2013-12-12 – 2013-12-16 (×13): 3.375 g via INTRAVENOUS
  Filled 2013-12-12 (×17): qty 50

## 2013-12-12 MED ORDER — TEMAZEPAM 15 MG PO CAPS
30.0000 mg | ORAL_CAPSULE | Freq: Every day | ORAL | Status: DC
  Administered 2013-12-12 – 2013-12-16 (×4): 30 mg via ORAL
  Filled 2013-12-12 (×4): qty 2

## 2013-12-12 MED ORDER — ATROPINE SULFATE 0.1 MG/ML IJ SOLN
INTRAMUSCULAR | Status: AC
  Administered 2013-12-12: 0.5 mg
  Filled 2013-12-12: qty 10

## 2013-12-12 MED ORDER — PANTOPRAZOLE SODIUM 40 MG PO TBEC
40.0000 mg | DELAYED_RELEASE_TABLET | Freq: Every day | ORAL | Status: DC
  Administered 2013-12-13 – 2013-12-17 (×5): 40 mg via ORAL
  Filled 2013-12-12 (×5): qty 1

## 2013-12-12 MED ORDER — FENTANYL CITRATE 0.05 MG/ML IJ SOLN
12.5000 ug | INTRAMUSCULAR | Status: DC | PRN
  Administered 2013-12-13 – 2013-12-15 (×2): 12.5 ug via INTRAVENOUS
  Filled 2013-12-12 (×2): qty 2

## 2013-12-12 MED ORDER — ASPIRIN 81 MG PO CHEW
81.0000 mg | CHEWABLE_TABLET | Freq: Every day | ORAL | Status: DC
  Administered 2013-12-13 – 2013-12-17 (×5): 81 mg via ORAL
  Filled 2013-12-12 (×5): qty 1

## 2013-12-12 MED ORDER — SALINE SPRAY 0.65 % NA SOLN
2.0000 | Freq: Three times a day (TID) | NASAL | Status: DC | PRN
  Filled 2013-12-12: qty 44

## 2013-12-12 MED ORDER — VANCOMYCIN HCL 500 MG IV SOLR
500.0000 mg | Freq: Two times a day (BID) | INTRAVENOUS | Status: DC
  Administered 2013-12-13 – 2013-12-15 (×6): 500 mg via INTRAVENOUS
  Filled 2013-12-12 (×6): qty 500

## 2013-12-12 MED ORDER — POLYETHYLENE GLYCOL 3350 17 G PO PACK
17.0000 g | PACK | Freq: Every day | ORAL | Status: DC
  Administered 2013-12-14 – 2013-12-15 (×2): 17 g via ORAL
  Filled 2013-12-12 (×7): qty 1

## 2013-12-12 MED ORDER — IPRATROPIUM-ALBUTEROL 0.5-2.5 (3) MG/3ML IN SOLN
3.0000 mL | RESPIRATORY_TRACT | Status: DC
  Administered 2013-12-12 – 2013-12-13 (×4): 3 mL via RESPIRATORY_TRACT
  Filled 2013-12-12 (×4): qty 3

## 2013-12-12 MED ORDER — ATROPINE SULFATE 1 MG/ML IJ SOLN
0.5000 mg | Freq: Once | INTRAMUSCULAR | Status: DC
  Filled 2013-12-12: qty 0.5

## 2013-12-12 MED ORDER — VANCOMYCIN HCL IN DEXTROSE 1-5 GM/200ML-% IV SOLN
1000.0000 mg | Freq: Once | INTRAVENOUS | Status: AC
  Administered 2013-12-12: 1000 mg via INTRAVENOUS
  Filled 2013-12-12: qty 200

## 2013-12-12 MED ORDER — SODIUM CHLORIDE 0.65 % NA SOLN: 2.0000 | Freq: Three times a day (TID) | NASAL | Status: DC | PRN

## 2013-12-12 MED ORDER — SODIUM CHLORIDE 0.9 % IV BOLUS (SEPSIS): 1000.0000 mL | INTRAVENOUS | Status: DC | PRN

## 2013-12-12 NOTE — ED Provider Notes (Signed)
CSN: [id number]     Arrival date & time 12/12/13  4098 History   First MD Initiated Contact with Patient 12/12/13 [phone]     Chief Complaint  Patient presents with  . Emesis  . Fever     (Consider location/radiation/quality/duration/timing/severity/associated sxs/prior Treatment) HPI Comments: Patient presents to the ER for evaluation of fever, nausea and vomiting. Patient brought to the ER from skilled nursing facility by ambulance. Symptoms began overnight. There has not been any diarrhea associated with his symptoms. Patient has baseline dementia, does not add any further history. Level V Caveat due to dementia.   Past Medical History  Diagnosis Date  . Atrial fibrillation with slow ventricular response     Symptoms of weakness  . Paroxysmal atrial fibrillation     Hx of, currently on amiodarone load  . Syncope     Probably related to sinus pause, finally conversion from atrial fibrillation to sinus rhythm  . COPD (chronic obstructive pulmonary disease)   . Hypertension   . Abnormal CXR 07/06/2007; 06/15/2010    Eleveted R HD 07/06/2007 > no change 09/92/2011  . Chronic back pain   . 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 Joint replacement     Family History  Problem Relation Age of Onset  . Cancer Maternal Grandmother   . Cancer Paternal Grandmother    History  Substance Use Topics  . Smoking status: Former Smoker -- 3.00 packs/day for 10 years    Quit date: 10/14/1953  . Smokeless tobacco: Not on file  . Alcohol Use: 3.5 oz/week    7 drink(s) per week    Review of Systems  Unable to perform ROS: Dementia      Allergies  Lisinopril  Home Medications   Current Outpatient Rx  Name  Route  Sig  Dispense  Refill  . amiodarone (PACERONE) 200 MG tablet   Oral   Take 1 tablet (200 mg total) by mouth daily.   30 tablet   1   . aspirin 81 MG tablet   Oral   Take 81 mg by mouth  daily.         . Calcium Carb-Cholecalciferol (CALCIUM + D3) 600-200 MG-UNIT TABS   Oral   Take 1 tablet by mouth daily.         . Cholecalciferol (VITAMIN D-3) 1000 UNITS CAPS   Oral   Take by mouth. Take once daily         . divalproex (DEPAKOTE SPRINKLE) 125 MG capsule      125 mg 2 (two) times daily.          Marland Kitchen donepezil (ARICEPT) 5 MG tablet   Oral   Take 5 mg by mouth at bedtime.          . dutasteride (AVODART) 0.5 MG capsule   Oral   Take 0.5 mg by mouth daily.         Marland Kitchen FLOVENT HFA 110 MCG/ACT inhaler   Inhalation   Inhale 1 puff into the lungs 2 (two) times daily.          . furosemide (LASIX) 40 MG tablet   Oral   Take 40 mg by mouth daily.         . Melatonin 5 MG TABS   Oral   Take 5 mg by mouth daily.         . mineral oil liquid  Place 2 drops into each ear once a week.         . mirtazapine (REMERON) 7.5 MG tablet   Oral   Take 1 tablet (7.5 mg total) by mouth at bedtime.         Marland Kitchen. omeprazole (PRILOSEC) 20 MG capsule   Oral   Take 20 mg by mouth daily.         . sertraline (ZOLOFT) 50 MG tablet   Oral   Take 50 mg by mouth daily.          . Tamsulosin HCl (FLOMAX) 0.4 MG CAPS   Oral   Take 0.4 mg by mouth daily.         . temazepam (RESTORIL) 30 MG capsule   Oral   Take 30 mg by mouth at bedtime.         . polyethylene glycol (MIRALAX / GLYCOLAX) packet   Oral   Take 17 g by mouth daily as needed for mild constipation.          . sodium chloride (OCEAN) 0.65 % nasal spray   Nasal   Place 2 sprays into the nose 3 (three) times daily as needed. For dryness.          BP 85/51  Pulse 72  Temp(Src) 101.7 F (38.7 C) (Oral)  Resp 27  Ht 5\' 6"  (1.676 m)  Wt 145 lb (65.772 kg)  BMI 23.41 kg/m2  SpO2 96% Physical Exam  Constitutional: He appears well-developed and well-nourished. No distress.  HENT:  Head: Normocephalic and atraumatic.  Right Ear: Hearing normal.  Left Ear: Hearing normal.   Nose: Nose normal.  Mouth/Throat: Oropharynx is clear and moist and mucous membranes are normal.  Eyes: Conjunctivae and EOM are normal. Pupils are equal, round, and reactive to light.  Neck: Normal range of motion. Neck supple.  Cardiovascular: Regular rhythm, S1 normal and S2 normal.  Exam reveals no gallop and no friction rub.   No murmur heard. Pulmonary/Chest: Tachypnea noted. No respiratory distress. He has rales in the right lower field and the left lower field. He exhibits no tenderness.  Abdominal: Soft. Normal appearance and bowel sounds are normal. There is no hepatosplenomegaly. There is no tenderness. There is no rebound, no guarding, no tenderness at McBurney's point and negative Murphy's sign. No hernia.  Musculoskeletal: Normal range of motion.  Neurological: He is alert. He has normal strength. He is disoriented. No cranial nerve deficit or sensory deficit. Coordination normal. GCS eye subscore is 4. GCS verbal subscore is 4. GCS motor subscore is 6.  Skin: Skin is warm, dry and intact. No rash noted. No cyanosis.  Psychiatric: He has a normal mood and affect. His speech is normal and behavior is normal.    ED Course  Procedures (including critical care time) Labs Review Labs Reviewed  CBC WITH DIFFERENTIAL - Abnormal; Notable for the following:    RBC 2.28 (*)    Hemoglobin 9.1 (*)    HCT 26.5 (*)    MCV 116.2 (*)    MCH 39.9 (*)    RDW 16.1 (*)    Platelets 542 (*)    Neutrophils Relative % 83 (*)    Lymphocytes Relative 8 (*)    Neutro Abs 7.9 (*)    All other components within normal limits  COMPREHENSIVE METABOLIC PANEL - Abnormal; Notable for the following:    Glucose, Bld 100 (*)    BUN 30 (*)    GFR calc non Af  Amer 82 (*)    All other components within normal limits  URINE CULTURE  CULTURE, BLOOD (ROUTINE X 2)  CULTURE, BLOOD (ROUTINE X 2)  URINALYSIS, ROUTINE W REFLEX MICROSCOPIC  LIPASE, BLOOD  I-STAT CG4 LACTIC ACID, ED   Imaging Review Dg  Chest Port 1 View  12/12/2013   CLINICAL DATA:  Fever and shortness of breath  EXAM: PORTABLE CHEST - 1 VIEW  COMPARISON:  DG CHEST 2V dated 08/24/2012; DG CHEST 1V PORT dated 08/11/2012; CT CHEST W/O CM dated 11/20/2011; DG CHEST 2 VIEW dated 04/07/2012  FINDINGS: The examination is degraded due to portable technique and kyphotic projection.  Grossly unchanged enlarged cardiac silhouette and mediastinal contours. Worsening coarse interstitial opacities within the right mid lung. Grossly unchanged bilateral perihilar and left basilar opacities, likely atelectasis or scar. No definite evidence of edema. No definite pneumothorax, though note, evaluation degraded secondary to patient's overlying chin. Grossly unchanged bones.  IMPRESSION: Degraded examination and demonstrates development of interstitial opacities within the right mid lung with differential considerations including early asymmetric pulmonary edema and (favored) developing atypical infection/aspiration.   Electronically Signed   By: Simonne Come M.D.   On: 12/12/2013 10:31     EKG Interpretation   Date/Time:  Sunday December 12 2013 09:59:10 EST Ventricular Rate:  63 PR Interval:    QRS Duration: 145 QT Interval:  478 QTC Calculation: 489 R Axis:   130 Text Interpretation:  Atrial fibrillation RBBB and LPFB No significant  change since last tracing Confirmed by Walida Cajas  MD, Karelly Dewalt [phone]) on  12/12/2013 10:07:21 AM      MDM   Final diagnoses:  Fever  Aspiration pneumonia   Patient presents to the ER from nursing home. Patient has been running a fever and had vomiting through the night. Upon arrival to the ER, patient is noted to be slightly tachypneic and hypoxic. He had diffuse rales at the bases of the lungs. Workup for source of fever was initiated. He has a leukocytosis with evidence of infiltrate, possible aspiration pneumonia on x-ray. Urinalysis clear. Patient appeared to be in atrial fibrillation on arrival, now is in sinus  rhythm, but has been mildly hypotensive. Patient administered gentle IV fluids, does have a history of CHF with diastolic dysfunction. Will require hospitalization in the step down unit for further management. Initiated on Zosyn and vancomycin for coverage of aspiration pneumonia as well as healthcare associated pneumonia.    Gilda Crease, MD 12/12/13 1257

## 2013-12-12 NOTE — Consult Note (Signed)
Name: [name] MRN: [id number] DOB: Nov 15, 1921    ADMISSION DATE:  12/12/2013 CONSULTATION DATE:  12/12/2013  REFERRING MD :  Malachi Bonds PRIMARY SERVICE: [facility]  CHIEF COMPLAINT:  hypotension  BRIEF PATIENT DESCRIPTION: 80 y.o , resident of assisted living presents with fever, nausea vomiting, RT infiltrate on CXR & hypotension not responding to fluids, lactate 2.0. PMH -chronic back pain currently wheelchair bound, esophageal diverticulum and dysphagia, mild dementia , PAF, chronic diastolic heart failure, h/o bradycardic arrest 2013   SIGNIFICANT EVENTS / STUDIES:    LINES / TUBES:   CULTURES: bld 31/ >> Urine 3/1 >>  ANTIBIOTICS: Zosyn 3/1 >> vanc 3/1 >>  HISTORY OF PRESENT ILLNESS:   80 y.o , resident of assisted living presents with fever, nausea vomiting, RT infiltrate on CXR & hypotension not responding to fluids, lactate 2.0. He reports vomiting x 1 day, similar episopde 2 wks ago, no abd pain or nausea. Febrile 101 in ED, hypotensive, mild hypoxia started on [place], 2 L fluid but bp remained in 70s, started on dopamine CXR showed rt infx, does not want foley He is a retired Forensic scientist. He smoked 3 packs per day until he quit in 1955   PMH -chronic back pain currently wheelchair bound, esophageal diverticulum and dysphagia, mild dementia , PAF, chronic diastolic heart failure, h/o bradycardic arrest 2013  PAST MEDICAL HISTORY :  Past Medical History  Diagnosis Date  . Atrial fibrillation with slow ventricular response     Symptoms of weakness  . Paroxysmal atrial fibrillation     Hx of, currently on amiodarone load  . Syncope     Probably related to sinus pause, finally conversion from atrial fibrillation to sinus rhythm  . COPD (chronic obstructive pulmonary disease)   . Hypertension   . Abnormal CXR 07/06/2007; 06/15/2010    Eleveted R HD 07/06/2007 > no change 09/92/2011  . Chronic back pain   . Esophageal diverticulum 08/08/2012    Large R sided per  esophagram   Past Surgical History  Procedure Laterality Date  . Joint replacement      right knee  . Appendectomy     Prior to Admission medications   Medication Sig Start Date End Date Taking? Authorizing Provider  amiodarone (PACERONE) 200 MG tablet Take 1 tablet (200 mg total) by mouth daily. 08/21/12  Yes Daniel J Angiulli, PA-C  aspirin 81 MG tablet Take 81 mg by mouth daily.   Yes Historical Provider, MD  Calcium Carb-Cholecalciferol (CALCIUM + D3) 600-200 MG-UNIT TABS Take 1 tablet by mouth daily.   Yes Historical Provider, MD  Cholecalciferol (VITAMIN D-3) 1000 UNITS CAPS Take by mouth. Take once daily   Yes Historical Provider, MD  divalproex (DEPAKOTE SPRINKLE) 125 MG capsule 125 mg 2 (two) times daily.  11/12/13  Yes Historical Provider, MD  donepezil (ARICEPT) 5 MG tablet Take 5 mg by mouth at bedtime.  11/16/13  Yes Historical Provider, MD  dutasteride (AVODART) 0.5 MG capsule Take 0.5 mg by mouth daily.   Yes Historical Provider, MD  FLOVENT HFA 110 MCG/ACT inhaler Inhale 1 puff into the lungs 2 (two) times daily.  11/19/13  Yes Historical Provider, MD  furosemide (LASIX) 40 MG tablet Take 40 mg by mouth daily.   Yes Historical Provider, MD  Melatonin 5 MG TABS Take 5 mg by mouth daily.   Yes Historical Provider, MD  mineral oil liquid Place 2 drops into each ear once a week.   Yes Historical Provider, MD  mirtazapine (REMERON) 7.5 MG tablet Take 1 tablet (7.5 mg total) by mouth at bedtime. 08/14/12  Yes Simonne Martinet, NP  omeprazole (PRILOSEC) 20 MG capsule Take 20 mg by mouth daily.   Yes Historical Provider, MD  sertraline (ZOLOFT) 50 MG tablet Take 50 mg by mouth daily.  12/10/13  Yes Historical Provider, MD  Tamsulosin HCl (FLOMAX) 0.4 MG CAPS Take 0.4 mg by mouth daily.   Yes Historical Provider, MD  temazepam (RESTORIL) 30 MG capsule Take 30 mg by mouth at bedtime.   Yes Historical Provider, MD  polyethylene glycol (MIRALAX / GLYCOLAX) packet Take 17 g by mouth daily as  needed for mild constipation.     Historical Provider, MD  sodium chloride (OCEAN) 0.65 % nasal spray Place 2 sprays into the nose 3 (three) times daily as needed. For dryness.    Historical Provider, MD   Allergies  Allergen Reactions  . Lisinopril Other (See Comments)    Unknown, listed on MAR    FAMILY HISTORY:  Family History  Problem Relation Age of Onset  . Cancer Maternal Grandmother   . Alzheimer's disease Mother   . COPD Father     possible CHF   SOCIAL HISTORY:  reports that he quit smoking about 60 years ago. He does not have any smokeless tobacco history on file. He reports that he drinks about 3.5 ounces of alcohol per week. He reports that he does not use illicit drugs.  REVIEW OF SYSTEMS:  No chest pain, dyspnea No nausea/ abd pain/ diarrhea No dizziness Denies urge to urinate, does not want foley   SUBJECTIVE:   VITAL SIGNS: Temp:  [100 F (37.8 C)-101.8 F (38.8 C)] 100 F (37.8 C) (03/01 1332) Pulse Rate:  [53-72] 53 (03/01 1332) Resp:  [20-31] 28 (03/01 1400) BP: (76-99)/(41-68) 76/42 mmHg (03/01 1400) SpO2:  [87 %-100 %] 100 % (03/01 1332) Weight:  [65.772 kg (145 lb)] 65.772 kg (145 lb) (03/01 1100) HEMODYNAMICS:   VENTILATOR SETTINGS:   INTAKE / OUTPUT: Intake/Output   None     PHYSICAL EXAMINATION: Gen. Pleasant, well-nourished, in no distress, normal affect ENT - no lesions, no post nasal drip, dry mucosa Neck: No JVD, no thyromegaly, no carotid bruits Lungs: no use of accessory muscles, no dullness to percussion, clear without rales or rhonchi  Cardiovascular: Rhythm irregular, heart sounds  normal, no murmurs, no peripheral edema Abdomen: soft and non-tender, no hepatosplenomegaly, BS normal. Musculoskeletal: No deformities, no cyanosis or clubbing Neuro:  alert, non focal Skin:  Warm, no lesions/ rash   LABS:  CBC  Recent Labs Lab 12/12/13 1000  WBC 9.6  HGB 9.1*  HCT 26.5*  PLT 542*   Coag's No results found for this  basename: APTT, INR,  in the last 168 hours BMET  Recent Labs Lab 12/12/13 1000  NA 140  K 4.3  CL 98  CO2 27  BUN 30*  CREATININE 0.69  GLUCOSE 100*   Electrolytes  Recent Labs Lab 12/12/13 1000  CALCIUM 9.3   Sepsis Markers  Recent Labs Lab 12/12/13 1037  LATICACIDVEN 2.07   ABG No results found for this basename: PHART, PCO2ART, PO2ART,  in the last 168 hours Liver Enzymes  Recent Labs Lab 12/12/13 1000  AST 18  ALT 16  ALKPHOS 92  BILITOT 0.9  ALBUMIN 3.5   Cardiac Enzymes No results found for this basename: TROPONINI, PROBNP,  in the last 168 hours Glucose No results found for this basename: GLUCAP,  in  the last 168 hours  Imaging Dg Chest Port 1 View  12/12/2013   CLINICAL DATA:  Fever and shortness of breath  EXAM: PORTABLE CHEST - 1 VIEW  COMPARISON:  DG CHEST 2V dated 08/24/2012; DG CHEST 1V PORT dated 08/11/2012; CT CHEST W/O CM dated 11/20/2011; DG CHEST 2 VIEW dated 04/07/2012  FINDINGS: The examination is degraded due to portable technique and kyphotic projection.  Grossly unchanged enlarged cardiac silhouette and mediastinal contours. Worsening coarse interstitial opacities within the right mid lung. Grossly unchanged bilateral perihilar and left basilar opacities, likely atelectasis or scar. No definite evidence of edema. No definite pneumothorax, though note, evaluation degraded secondary to patient's overlying chin. Grossly unchanged bones.  IMPRESSION: Degraded examination and demonstrates development of interstitial opacities within the right mid lung with differential considerations including early asymmetric pulmonary edema and (favored) developing atypical infection/aspiration.   Electronically Signed   By: Simonne ComeJohn  Watts M.D.   On: 12/12/2013 10:31     CXR: rt ASD  ASSESSMENT / PLAN:  PULMONARY A:Rt HCAP P:   O2 [place]   CARDIOVASCULAR A: Chronic diastolic heart failure Atrial fibn on amio EKG- RBBB with evolved IWMI (chronic) Septic  shock P:  Chk trops to r/o ischemia Dopamine gtt Place CVL  RENAL A:  Oliguria P:   Refuses foley  GASTROINTESTINAL A:  N/V P:   Chk lipase, no cause apparent ? gastritis  HEMATOLOGIC A:  No issues P:  Follow CBC  INFECTIOUS A:  Rt HCAP / aspiration P:   Zosyn/ vanc empiric  ENDOCRINE A:  No issues   P:     NEUROLOGIC A:  Mild dementia P:   Appears lucid & able to make decisions Can consent to CVL  TODAY'S SUMMARY: Presumed septic shock vs dehydration from vomiting, cover for HCAP & treat with fluids/ pressors  I have personally obtained a history, examined the patient, evaluated laboratory and imaging results, formulated the assessment and plan and placed orders. CRITICAL CARE: The patient is critically ill with multiple organ systems failure and requires high complexity decision making for assessment and support, frequent evaluation and titration of therapies, application of advanced monitoring technologies and extensive interpretation of multiple databases. Critical Care Time devoted to patient care services described in this note is 45 minutes.   Oretha MilchALVA,Akayla Brass V.  Pulmonary and Critical Care Medicine [facility] Pager: [phone]  12/12/2013, 4:13 PM

## 2013-12-12 NOTE — ED Notes (Signed)
I have just phoned Amy, RN on step-down; and will transport shortly.

## 2013-12-12 NOTE — H&P (Addendum)
[facility] History and Physical  [name] XLK:[id number] DOB: 05-28-1922 DOA: 12/12/2013  Referring physician:  Jaci Carrel PCP:  Ginette Otto, MD   Chief Complaint:  Nausea, vomiting, fever  HPI:  The patient is a 80 y.o. year-old male with history of PAF, chronic diastolic heart failure, COPD, HTN, chronic back pain currently wheelchair bound, esophageal diverticulum and dysphagia, and mild dementia  who presents with nausea, vomiting, and fever.  He presents from [facility].  Two years ago, he was admitted to [facility] with pneumonia at which time he had cardiac arrest.  The patient was last at their baseline health yesterday.  He states he was up overnight with nausea and heaves but denies vomiting.  This morning he developed fever and he was transported by EMS to the ER.  He states he has some nonproductive cough and mild SOB, but otherwise feels okay.  Denies fevers or chills (despite fever in ER), and current nausea.  Denies abdominal pain and dysuria.  Last BM was 2-3 days ago and was normal.  Denies chest pain.    In the ER, Pennington signs are notable for temperature of 101.8, pulse in the 50s, respirations 31, blood pressure initially 99/68 but after a liter and a half of IV fluids, trended down to 78/41. He was hypoxic to 87% on room air and was started on 2 L nasal cannula currently 100% oxygen saturations. His labs were notable for a white blood cell count of 9.6, hemoglobin 9.1, platelets 542, BMP notable for elevated BUN of 30, creatinine 0.69 at baseline. His lactate was 2. Troponin was not obtained. Chest x-ray demonstrates interstitial opacities within the right mid long concerning for her asymmetric pulmonary edema versus developing infection or aspiration. Given his recent nausea and heaving, high suspicion for aspiration pneumonia. He was given vancomycin and Zosyn after blood cultures were obtained. Urinalysis was unremarkable. Critical care has been notified of  patient's declining blood pressure despite IV fluids, and he is being admitted to the step down unit for close monitoring.  Review of Systems:  General:  Denies fevers, chills HEENT:  Denies changes to hearing and vision, rhinorrhea, sinus congestion, sore throat CV:  Denies chest pain and palpitations, lower extremity edema.  PULM:  Positive SOB, cough.   GI:  Denies nausea, vomiting, constipation, diarrhea.   GU:  Denies dysuria, frequency, urgency ENDO:  Denies polyuria, polydipsia.   HEME:  Denies hematemesis, blood in stools, melena, abnormal bruising or bleeding.  LYMPH:  Denies lymphadenopathy.   MSK:  Denies arthralgias, myalgias.   DERM:  Denies skin rash or ulcer.   NEURO:  Denies focal numbness, weakness, slurred speech, confusion, facial droop.  PSYCH:  Denies anxiety and depression.    Past Medical History  Diagnosis Date  . Atrial fibrillation with slow ventricular response     Symptoms of weakness  . Paroxysmal atrial fibrillation     Hx of, currently on amiodarone load  . Syncope     Probably related to sinus pause, finally conversion from atrial fibrillation to sinus rhythm  . COPD (chronic obstructive pulmonary disease)   . Hypertension   . Abnormal CXR 07/06/2007; 06/15/2010    Eleveted R HD 07/06/2007 > no change 09/92/2011  . Chronic back pain   . 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 Joint replacement      right knee  . Appendectomy  Social History:  reports that he quit smoking about 60 years ago. He does not have any smokeless tobacco history on file. He reports that he drinks about 3.5 ounces of alcohol per week. He reports that he does not use illicit drugs. Divorced Lives at [facility], wheelchair, does not ambulate Has children  Allergies  Allergen Reactions  . Lisinopril Other (See Comments)    Unknown, listed on MAR    Family History  Problem Relation  Age of Onset  . Cancer Maternal Grandmother   . Alzheimer's disease Mother   . COPD Father     possible CHF     Prior to Admission medications   Medication Sig Start Date End Date Taking? Authorizing Provider  amiodarone (PACERONE) 200 MG tablet Take 1 tablet (200 mg total) by mouth daily. 08/21/12  Yes Daniel J Angiulli, PA-C  aspirin 81 MG tablet Take 81 mg by mouth daily.   Yes Historical Provider, MD  Calcium Carb-Cholecalciferol (CALCIUM + D3) 600-200 MG-UNIT TABS Take 1 tablet by mouth daily.   Yes Historical Provider, MD  Cholecalciferol (VITAMIN D-3) 1000 UNITS CAPS Take by mouth. Take once daily   Yes Historical Provider, MD  divalproex (DEPAKOTE SPRINKLE) 125 MG capsule 125 mg 2 (two) times daily.  11/12/13  Yes Historical Provider, MD  donepezil (ARICEPT) 5 MG tablet Take 5 mg by mouth at bedtime.  11/16/13  Yes Historical Provider, MD  dutasteride (AVODART) 0.5 MG capsule Take 0.5 mg by mouth daily.   Yes Historical Provider, MD  FLOVENT HFA 110 MCG/ACT inhaler Inhale 1 puff into the lungs 2 (two) times daily.  11/19/13  Yes Historical Provider, MD  furosemide (LASIX) 40 MG tablet Take 40 mg by mouth daily.   Yes Historical Provider, MD  Melatonin 5 MG TABS Take 5 mg by mouth daily.   Yes Historical Provider, MD  mineral oil liquid Place 2 drops into each ear once a week.   Yes Historical Provider, MD  mirtazapine (REMERON) 7.5 MG tablet Take 1 tablet (7.5 mg total) by mouth at bedtime. 08/14/12  Yes Simonne Martinet, NP  omeprazole (PRILOSEC) 20 MG capsule Take 20 mg by mouth daily.   Yes Historical Provider, MD  sertraline (ZOLOFT) 50 MG tablet Take 50 mg by mouth daily.  12/10/13  Yes Historical Provider, MD  Tamsulosin HCl (FLOMAX) 0.4 MG CAPS Take 0.4 mg by mouth daily.   Yes Historical Provider, MD  temazepam (RESTORIL) 30 MG capsule Take 30 mg by mouth at bedtime.   Yes Historical Provider, MD  polyethylene glycol (MIRALAX / GLYCOLAX) packet Take 17 g by mouth daily as needed for  mild constipation.     Historical Provider, MD  sodium chloride (OCEAN) 0.65 % nasal spray Place 2 sprays into the nose 3 (three) times daily as needed. For dryness.    Historical Provider, MD   Physical Exam: Filed Vitals:   12/12/13 1013 12/12/13 1100 12/12/13 1223 12/12/13 1332  BP: 94/58  85/51 78/41  Pulse: 64  72 53  Temp:   101.7 F (38.7 C) 100 F (37.8 C)  TempSrc:   Oral Oral  Resp: 27  27 31   Height:  5\' 6"  (1.676 m)    Weight:  65.772 kg (145 lb)    SpO2: 96%  96% 100%     General:  Thin CM, NAD, appears dry  Eyes:  PERRL, anicteric, non-injected.  Mildly sunken  ENT:  Nares clear.  OP clear, non-erythematous without plaques or exudates.  Dry MM.  Neck:  Supple without TM or JVD.    Lymph:  No cervical, supraclavicular, or submandibular LAD.  Cardiovascular:  RRR, normal S1, S2, without m/r/g.  2+ pulses, warm extremities  Respiratory:  Course rales at bilateral bases, no wheeze or rhonchi, without increased WOB.  Abdomen:  NABS.  Soft, ND/NT.    Skin:  No rashes or focal lesions.  Musculoskeletal:  Normal bulk and tone.  No LE edema.  Psychiatric:  A & O x 4.  Appropriate affect.  Neurologic:  CN 3-12 intact.  5/5 strength.  Sensation intact.  Labs on Admission:  Basic Metabolic Panel:  Recent Labs Lab 12/12/13 1000  NA 140  K 4.3  CL 98  CO2 27  GLUCOSE 100*  BUN 30*  CREATININE 0.69  CALCIUM 9.3   Liver Function Tests:  Recent Labs Lab 12/12/13 1000  AST 18  ALT 16  ALKPHOS 92  BILITOT 0.9  PROT 7.2  ALBUMIN 3.5    Recent Labs Lab 12/12/13 1000  LIPASE 17   No results found for this basename: AMMONIA,  in the last 168 hours CBC:  Recent Labs Lab 12/12/13 1000  WBC 9.6  NEUTROABS 7.9*  HGB 9.1*  HCT 26.5*  MCV 116.2*  PLT 542*   Cardiac Enzymes: No results found for this basename: CKTOTAL, CKMB, CKMBINDEX, TROPONINI,  in the last 168 hours  BNP (last 3 results) No results found for this basename: PROBNP,  in  the last 8760 hours CBG: No results found for this basename: GLUCAP,  in the last 168 hours  Radiological Exams on Admission: Dg Chest Port 1 View  12/12/2013   CLINICAL DATA:  Fever and shortness of breath  EXAM: PORTABLE CHEST - 1 VIEW  COMPARISON:  DG CHEST 2V dated 08/24/2012; DG CHEST 1V PORT dated 08/11/2012; CT CHEST W/O CM dated 11/20/2011; DG CHEST 2 VIEW dated 04/07/2012  FINDINGS: The examination is degraded due to portable technique and kyphotic projection.  Grossly unchanged enlarged cardiac silhouette and mediastinal contours. Worsening coarse interstitial opacities within the right mid lung. Grossly unchanged bilateral perihilar and left basilar opacities, likely atelectasis or scar. No definite evidence of edema. No definite pneumothorax, though note, evaluation degraded secondary to patient's overlying chin. Grossly unchanged bones.  IMPRESSION: Degraded examination and demonstrates development of interstitial opacities within the right mid lung with differential considerations including early asymmetric pulmonary edema and (favored) developing atypical infection/aspiration.   Electronically Signed   By: Simonne Come M.D.   On: 12/12/2013 10:31    EKG: Independently reviewed.  A-fib, rate controlled, RBBB + LAFB, stable from prior (except baseline in V6 is poor)  Assessment/Plan Active Problems:   Aspiration pneumonia  ---  Acute hypoxic respiratory failure and septic vs. hypovolemic shock secondary to HCAP vs. Aspiration pneumonia -  Stepdown  -  Sputum culture -  Urine legionella Ag -  Urine S. 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  Given 1 dose of atropine 0.5mg  in ER with improvement in HR and subsequently started on dopamine gtt.   -  Cont ASA -  D/c amiodarone -  Start dopamine gtt  Nausea and vomiting, unclear etiology.  LFTs, lipase, and UA neg -  May be related to viral syndrome such as flu or secondary to primary process of pneumonia -  zofran prn  Chronic diastolic heart failure, possible cardiomegaly and pulmonary edema, however, clinically appears dry -  jucidious use of IVF -  Hold lasix -  ECHO  HTN, blood pressure low normal, not on BP medications  COPD, not on medication, distant smoking hx and not wheezing.   -  duonebs prn  Dysphagia and esophageal diverticulum -  CLD for now  -  Speech therapy  Depression and dementia, stable.  Continue home meds.  Diet:  CLD Access:  PIV IVF:  yes Proph:  lovenox  Code Status: full -  Repeatedly asked patient about code status.  A&Ox4.  Had cardiac arrest two years ago and survived and would like all efforts made to sustain life again this time including intubation.   Family Communication:  Spoke with patient's son.  He is going to clarify NOK vs. Healthcare power of attorney information with the patient's lawyer.   Disposition Plan: Admit to stepdown  Time spent: 60 min Breta Demedeiros [facility] Pager [phone]  If 7PM-7AM, please contact night-coverage [email or web address] Password [email or web address] 12/12/2013, 1:40 PM

## 2013-12-12 NOTE — ED Notes (Signed)
Bed: [id number] Expected date: 12/12/13 Expected time: 9:30 AM Means of arrival: Ambulance Comments: N/V/D since last night

## 2013-12-12 NOTE — Procedures (Signed)
Central Venous Catheter Insertion Procedure Note [name] [id number] [birth year]  Procedure: Insertion of Central Venous Catheter Indications: Assessment of intravascular volume and Drug and/or fluid administration  Procedure Details Consent: Risks of procedure as well as the alternatives and risks of each were explained to the (patient/caregiver).  Consent for procedure obtained. Time Out: Verified patient identification, verified procedure, site/side was marked, verified correct patient position, special equipment/implants available, medications/allergies/relevent history reviewed, required imaging and test results available.  Performed  Maximum sterile technique was used including antiseptics, cap, gloves, gown, hand hygiene, mask and sheet. Skin prep: Chlorhexidine; local anesthetic administered A antimicrobial bonded/coated triple lumen catheter was placed in the right internal jugular vein using the Seldinger technique.  Evaluation Blood flow good Complications: No apparent complications Patient did tolerate procedure well. Chest X-ray ordered to verify placement.  CXR: pending.  [name] V. 12/12/2013, 5:10 PM

## 2013-12-12 NOTE — ED Notes (Signed)
Dr. Malachi BondsShort is with him as I write this.  She is aware that current s.b.p. Is 77.  Orders rec'd.  He has not been in distress at any time during this visit, and he remains quite lucid.  He is able to tell us it is March 1st and he expresses his reticence at being admitted to hospital, preferring to "go home".  Dr. Malachi BondsShort assures him that his condition warrants admission.

## 2013-12-12 NOTE — ED Notes (Signed)
He remains awake and alert and I now take him to ICU.

## 2013-12-12 NOTE — ED Notes (Signed)
Note:  Pt. Has had a total of 2050 ml of IV fluid thus far.

## 2013-12-12 NOTE — ED Notes (Signed)
He is much more comfortable--no longer having shaking chills.

## 2013-12-12 NOTE — ED Notes (Signed)
Pt elects to not allow cath to be placed, pt is oriented and alert, able to void in urinal, Dr Blinda LeatherwoodPollina aware pt is requesting no cath.

## 2013-12-12 NOTE — Progress Notes (Signed)
Dr. Herma CarsonZ from [facility] verified with nursing staff that it is ok to draw blood off of central line with its current placement.

## 2013-12-12 NOTE — ED Notes (Signed)
[facility] found [facility]. To have been vomiting and "felt hot".  EMS were called and he arrives here with shaking chills, awake, alert and in no distress.

## 2013-12-12 NOTE — Progress Notes (Addendum)
ANTIBIOTIC CONSULT NOTE - INITIAL  Pharmacy Consult for Vancomycin Indication: pneumonia  Allergies  Allergen Reactions  . Lisinopril Other (See Comments)    [facility]    Patient Measurements: Height: 5\' 6"  (167.6 cm) Weight: 145 lb (65.772 kg) IBW/kg (Calculated) : 63.8   Vital Signs: Temp: 101.8 F (38.8 C) (03/01 0948) Temp src: Rectal (03/01 0948) BP: 94/58 mmHg (03/01 1013) Pulse Rate: 64 (03/01 1013)   Labs:  Recent Labs  12/12/13 1000  WBC 9.6  HGB 9.1*  PLT 542*  CREATININE 0.69   Estimated Creatinine Clearance: 56.5 ml/min (by C-G formula based on Cr of 0.69). No results found for this basename: VANCOTROUGH, VANCOPEAK, VANCORANDOM, GENTTROUGH, GENTPEAK, GENTRANDOM, TOBRATROUGH, TOBRAPEAK, TOBRARND, AMIKACINPEAK, AMIKACINTROU, AMIKACIN,  in the last 72 hours   Medical History: Past Medical History  Diagnosis Date  . Atrial fibrillation with slow ventricular response     Symptoms of weakness  . Paroxysmal atrial fibrillation     Hx of, currently on amiodarone load  . Syncope     Probably related to sinus pause, finally conversion from atrial fibrillation to sinus rhythm  . COPD (chronic obstructive pulmonary disease)   . Hypertension   . Abnormal CXR 07/06/2007; 06/15/2010    Eleveted R HD 07/06/2007 > no change 09/92/2011  . Chronic back pain   . Esophageal diverticulum 08/08/2012    Large R sided per esophagram    Assessment: [age] yoM from [facility] presented 3/1 with c/o fever, nausea and vomiting. CXR with interstitial opacities in the R mid lung - differential includes early asymmetric pulmonary edema and favoring developing atypical infection/aspiration. MD ordered Vanc and Zosyn per pharmacy to dose for PNA.  3/1 >> Vanc >>  3/1 >> Zosyn x 1   Tmax: 101.8 WBCs: wnl Renal: Scr 0.69, CG 57, N 64  3/1 blood x 2 >> in process 3/1 urine >> in process  Goal of Therapy:  Vancomycin trough level 15-20 mcg/ml  Plan:   Vancomycin  1 g IV x 1 then 500 mg IV q12h  Zosyn 3.375 gm IV q8h extended dosing over 8 hours  Pharmacy will f/u  Geoffry Paradisehuyvan Analeya Luallen, PharmD, BCPS Pager: [phone] 11:53 AM Pharmacy #: [id number]

## 2013-12-12 NOTE — ED Notes (Signed)
He has just had a sustained episode of profound bradycardia (h.r. 25-35) which has just responded well to 0.5mg  Atropine IV.  He remains asymptomatic throughout this episode.  Dr. Malachi BondsShort is here and witnesses this also.

## 2013-12-13 ENCOUNTER — Inpatient Hospital Stay (HOSPITAL_COMMUNITY): Payer: Medicare Other

## 2013-12-13 ENCOUNTER — Encounter (HOSPITAL_COMMUNITY): Payer: Self-pay | Admitting: Cardiology

## 2013-12-13 DIAGNOSIS — J189 Pneumonia, unspecified organism: Secondary | ICD-10-CM | POA: Diagnosis present

## 2013-12-13 DIAGNOSIS — A419 Sepsis, unspecified organism: Secondary | ICD-10-CM | POA: Diagnosis present

## 2013-12-13 DIAGNOSIS — R001 Bradycardia, unspecified: Secondary | ICD-10-CM | POA: Diagnosis present

## 2013-12-13 DIAGNOSIS — R6521 Severe sepsis with septic shock: Secondary | ICD-10-CM

## 2013-12-13 DIAGNOSIS — I359 Nonrheumatic aortic valve disorder, unspecified: Secondary | ICD-10-CM

## 2013-12-13 DIAGNOSIS — I440 Atrioventricular block, first degree: Secondary | ICD-10-CM | POA: Diagnosis present

## 2013-12-13 DIAGNOSIS — I4510 Unspecified right bundle-branch block: Secondary | ICD-10-CM | POA: Diagnosis present

## 2013-12-13 DIAGNOSIS — R112 Nausea with vomiting, unspecified: Secondary | ICD-10-CM

## 2013-12-13 DIAGNOSIS — I498 Other specified cardiac arrhythmias: Secondary | ICD-10-CM

## 2013-12-13 DIAGNOSIS — D649 Anemia, unspecified: Secondary | ICD-10-CM

## 2013-12-13 LAB — BASIC METABOLIC PANEL
BUN: 22 mg/dL (ref 6–23)
CHLORIDE: 107 meq/L (ref 96–112)
CO2: 24 mEq/L (ref 19–32)
Calcium: 8 mg/dL — ABNORMAL LOW (ref 8.4–10.5)
Creatinine, Ser: 0.69 mg/dL (ref 0.50–1.35)
GFR calc non Af Amer: 82 mL/min — ABNORMAL LOW (ref >90)
Glucose, Bld: 103 mg/dL — ABNORMAL HIGH (ref 70–99)
Potassium: 3.2 mEq/L — ABNORMAL LOW (ref 3.7–5.3)
Sodium: 143 mEq/L (ref 137–147)

## 2013-12-13 LAB — CBC
HCT: 20.5 % — ABNORMAL LOW (ref 39.0–52.0)
HEMATOCRIT: 21.2 % — AB (ref 39.0–52.0)
Hemoglobin: 7.1 g/dL — ABNORMAL LOW (ref 13.0–17.0)
Hemoglobin: 7 g/dL — ABNORMAL LOW (ref 13.0–17.0)
MCH: 39.2 pg — ABNORMAL HIGH (ref 26.0–34.0)
MCH: 40 pg — AB (ref 26.0–34.0)
MCHC: 33.5 g/dL (ref 30.0–36.0)
MCHC: 34.1 g/dL (ref 30.0–36.0)
MCV: 117.1 fL — ABNORMAL HIGH (ref 78.0–100.0)
MCV: 117.1 fL — AB (ref 78.0–100.0)
PLATELETS: 423 10*3/uL — AB (ref 150–400)
Platelets: 461 10*3/uL — ABNORMAL HIGH (ref 150–400)
RBC: 1.81 MIL/uL — AB (ref 4.22–5.81)
RBC: 1.75 MIL/uL — ABNORMAL LOW (ref 4.22–5.81)
RDW: 16.4 % — ABNORMAL HIGH (ref 11.5–15.5)
RDW: 16.2 % — AB (ref 11.5–15.5)
WBC: 7.2 10*3/uL (ref 4.0–10.5)
WBC: 5.2 10*3/uL (ref 4.0–10.5)

## 2013-12-13 LAB — LEGIONELLA ANTIGEN, URINE: Legionella Antigen, Urine: NEGATIVE

## 2013-12-13 LAB — LACTIC ACID, PLASMA: LACTIC ACID, VENOUS: 0.8 mmol/L (ref 0.5–2.2)

## 2013-12-13 LAB — URINE CULTURE
Colony Count: NO GROWTH
Culture: NO GROWTH

## 2013-12-13 LAB — TROPONIN I: Troponin I: <0.3 ng/mL (ref <0.30)

## 2013-12-13 LAB — PROCALCITONIN: PROCALCITONIN: 0.87 ng/mL

## 2013-12-13 LAB — CARBOXYHEMOGLOBIN
CARBOXYHEMOGLOBIN: 1.6 % — AB (ref 0.5–1.5)
Methemoglobin: 1.8 % — ABNORMAL HIGH (ref 0.0–1.5)
O2 SAT: 50 %
Total hemoglobin: 13.1 g/dL — ABNORMAL LOW (ref 13.5–18.0)

## 2013-12-13 LAB — CORTISOL: CORTISOL PLASMA: 13 ug/dL

## 2013-12-13 LAB — MAGNESIUM: MAGNESIUM: 1.7 mg/dL (ref 1.5–2.5)

## 2013-12-13 MED ORDER — DOPAMINE-DEXTROSE 3.2-5 MG/ML-% IV SOLN
2.0000 ug/kg/min | INTRAVENOUS | Status: DC
  Administered 2013-12-13: 6 ug/kg/min via INTRAVENOUS
  Administered 2013-12-15: 5 ug/kg/min via INTRAVENOUS
  Filled 2013-12-13 (×2): qty 250

## 2013-12-13 MED ORDER — IPRATROPIUM-ALBUTEROL 0.5-2.5 (3) MG/3ML IN SOLN
3.0000 mL | Freq: Four times a day (QID) | RESPIRATORY_TRACT | Status: DC
  Administered 2013-12-13 – 2013-12-17 (×17): 3 mL via RESPIRATORY_TRACT
  Filled 2013-12-13 (×17): qty 3

## 2013-12-13 MED ORDER — POTASSIUM CHLORIDE 10 MEQ/100ML IV SOLN
10.0000 meq | INTRAVENOUS | Status: AC
  Administered 2013-12-13 (×4): 10 meq via INTRAVENOUS
  Filled 2013-12-13 (×4): qty 100

## 2013-12-13 MED ORDER — IPRATROPIUM-ALBUTEROL 0.5-2.5 (3) MG/3ML IN SOLN
RESPIRATORY_TRACT | Status: AC
  Administered 2013-12-13: 3 mL
  Filled 2013-12-13: qty 3

## 2013-12-13 MED ORDER — SODIUM CHLORIDE 0.9 % IV SOLN
INTRAVENOUS | Status: DC
  Administered 2013-12-13 – 2013-12-14 (×2): via INTRAVENOUS

## 2013-12-13 MED ORDER — MAGNESIUM SULFATE 40 MG/ML IJ SOLN
2.0000 g | Freq: Once | INTRAMUSCULAR | Status: AC
  Administered 2013-12-13: 2 g via INTRAVENOUS
  Filled 2013-12-13: qty 50

## 2013-12-13 NOTE — Progress Notes (Signed)
Echocardiogram 2D Echocardiogram has been performed.  [name], [name] M 12/13/2013, 10:58 AM

## 2013-12-13 NOTE — Progress Notes (Signed)
eLink Physician-Brief Progress Note Patient Name: Meredeth IdeClyde M Consoli DOB: [birth year] MRN: [id number]  Date of Service  12/13/2013   HPI/Events of Note  Cbc noted at 4 am, no bleeding, nut was positive 4 liters   eICU Interventions  likley diluiotnal, check cbc   Intervention Category Intermediate Interventions: Hypervolemia - evaluation and management  Nelda BucksFEINSTEIN,DANIEL J. 12/13/2013, 5:44 PM

## 2013-12-13 NOTE — Progress Notes (Signed)
[facility] PROGRESS NOTE  [name] UJW:[id number] DOB: July 06, 1922 DOA: 12/12/2013 PCP: Ginette OttoSTONEKING,HAL THOMAS, MD  Assessment/Plan  Acute hypoxic respiratory failure and septic vs. hypovolemic shock secondary to HCAP vs. Aspiration pneumonia  - Sputum culture not obtained - Urine legionella Ag negative - Urine S. 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  Episode of bradycardia with hypotension improved with atropine in emergency department -  Bradycardia into the 20s with pauses greater than 2 seconds -  TSH  -  Cardiology consultation - Cont ASA  - Amiodarone discontinued - Wean dopamine gtt as tolerated  Nausea and vomiting, unclear etiology. LFTs, lipase, and UA neg  - May be related to viral syndrome such as flu or secondary to primary process of pneumonia  - zofran prn   Chronic diastolic heart failure, possible cardiomegaly and pulmonary edema, however, clinically appears dry  - Continue IV fluids - Hold lasix  - ECHO:  Ejection fraction of 55-60%, mild aortic regurg, left atrium moderately dilated  HTN, blood pressure low normal, not on BP medications   COPD, not on medication, distant smoking hx and not wheezing. Continue duonebs prn   Dysphagia and esophageal diverticulum  - Dysphasia 3 with thin liquids - Appreciate Speech therapy   Depression and dementia, stable. Continue home meds.  Acute urinary retention -  Check PVR and if > 300mL, place foley  Macrocytic anemia, decreasing hgb -  Iron studies -  B12 -  Folate -  TSH  -   Occult stool -  Transfuse for hemoglobin < 7 or evidence of active bleeding with hemodynamic instability  Hypokalemia, replete with IV KCl.   -  Magnesium 1.7, give 2gm magnesium -  Check phos in AM  Diet:  Dysphagia 3 Access:  Malposition Central line, placed arch first IVF:  Yes Proph:  Lovenox  Code Status: Full Family Communication: Patient alone Disposition Plan: Pending improvement in blood pressure and heart rate   Consultants:  Critical care  Cardiology  Procedures:  Chest x-ray  Antibiotics:  Vancomycin from 3/1  Zosyn from 3/1   HPI/Subjective:  Patient states that he is feeling a little better today. He feels thirsty. Has some mild cough nonproductive, denies nausea, had one episode of diarrhea he states however this is not recorded on the computer and the nurse to not report this to me on rounds.  Objective: Filed Vitals:   12/13/13 1000 12/13/13 1030 12/13/13 1200 12/13/13 1240  BP: 117/66 108/47 102/42   Pulse: 76 70 54   Temp:   98.6 F (37 C)   TempSrc:   Core (Comment)   Resp: 10 22 19    Height:      Weight:    58 kg (127 lb 13.9 oz)  SpO2: 98% 100% 100%     Intake/Output Summary (Last 24 hours) at 12/13/13 1414 Last data filed at 12/13/13 1246  Gross per 24 hour  Intake 6375.1 ml  Output    400 ml  Net 5975.1 ml   Filed Weights   12/12/13 1530 12/13/13 0000  12/13/13 1240  Weight: 60.4 kg (133 lb 2.5 oz) 61.8 kg (136 lb 3.9 oz) 58 kg (127 lb 13.9 oz)    Exam:   General:  Thin Caucasian male, No acute distress  HEENT:  Sunken eyes, dry skin, NCAT, MMM  Cardiovascular:  IRRR, bradycardic, nl S1, S2 no mrg, 2+ pulses, warm extremities  Respiratory:  Rales at the bilateral bases, no rhonchi or wheeze, no increased WOB  Abdomen:   NABS, soft, mildly distended, bladder feels very full and TTP inferior quadrants  MSK:   Normal tone and bulk, no LEE  Neuro:  Grossly intact  Data Reviewed: Basic Metabolic Panel:  Recent  Labs Lab 12/12/13 1000 12/13/13 0400  NA 140 143  K 4.3 3.2*  CL 98 107  CO2 27 24  GLUCOSE 100* 103*  BUN 30* 22  CREATININE 0.69 0.69  CALCIUM 9.3 8.0*  MG  --  1.7   Liver Function Tests:  Recent Labs Lab 12/12/13 1000  AST 18  ALT 16  ALKPHOS 92  BILITOT 0.9  PROT 7.2  ALBUMIN 3.5    Recent Labs Lab 12/12/13 1000  LIPASE 17   No results found for this basename: AMMONIA,  in the last 168 hours CBC:  Recent Labs Lab 12/12/13 1000 12/13/13 0400  WBC 9.6 7.2  NEUTROABS 7.9*  --   HGB 9.1* 7.1*  HCT 26.5* 21.2*  MCV 116.2* 117.1*  PLT 542* 461*   Cardiac Enzymes:  Recent Labs Lab 12/12/13 1609 12/12/13 2139 12/13/13 0339  TROPONINI <0.30 <0.30 <0.30   BNP (last 3 results) No results found for this basename: PROBNP,  in the last 8760 hours CBG: No results found for this basename: GLUCAP,  in the last 168 hours  Recent Results (from the past 240 hour(s))  CULTURE, BLOOD (ROUTINE X 2)     Status: None   Collection Time    12/12/13 10:00 AM      Result Value Ref Range Status   Specimen Description BLOOD BLOOD RIGHT FOREARM   Final   Special Requests BOTTLES DRAWN AEROBIC AND ANAEROBIC 5CC EACH   Final   Culture  Setup Time     Final   Value: 12/12/2013 17:17     Performed at [facility]   Culture     Final   Value:        BLOOD CULTURE RECEIVED NO GROWTH TO DATE CULTURE WILL BE HELD FOR 5 DAYS BEFORE ISSUING A FINAL NEGATIVE REPORT     Performed at [facility]   Report Status PENDING   Incomplete  CULTURE, BLOOD (ROUTINE X 2)     Status: None   Collection Time    12/12/13 11:05 AM      Result Value Ref Range Status   Specimen Description BLOOD BLOOD LEFT FOREARM   Final   Special Requests BOTTLES DRAWN AEROBIC AND ANAEROBIC 5CC EACH   Final   Culture  Setup Time     Final   Value: 12/12/2013 17:17     Performed at [facility]   Culture     Final   Value:        BLOOD CULTURE RECEIVED NO GROWTH TO DATE CULTURE  WILL BE HELD FOR 5 DAYS BEFORE ISSUING A FINAL NEGATIVE REPORT     Performed at [facility]   Report Status PENDING   Incomplete  MRSA PCR SCREENING     Status: None   Collection Time  12/12/13  3:33 PM      Result Value Ref Range Status   MRSA by PCR NEGATIVE  NEGATIVE Final   Comment:            The GeneXpert MRSA Assay (FDA     approved for NASAL specimens     only), is one component of a     comprehensive MRSA colonization     surveillance program. It is not     intended to diagnose MRSA     infection nor to guide or     monitor treatment for     MRSA infections.     Studies: Dg Chest Port 1 View  12/13/2013   CLINICAL DATA:  Pneumonia.  EXAM: PORTABLE CHEST - 1 VIEW  COMPARISON:  Chest x-ray from yesterday  FINDINGS: Unchanged patchy bilateral airspace disease, most notable in the peripheral right chest. Unchanged malpositioning of the right IJ catheter, directed towards the right axilla.  Hypoaeration. No effusion or pneumothorax visible. Unchanged cardiomegaly.  IMPRESSION: 1. Persistent malpositioning of the right IJ catheter, tip directed to the axilla. 2. Unchanged patchy airspace disease compatible with history of pneumonia.   Electronically Signed   By: Tiburcio Pea M.D.   On: 12/13/2013 06:14   Dg Chest Port 1 View  12/12/2013   CLINICAL DATA:  Central line placement.  EXAM: PORTABLE CHEST - 1 VIEW  COMPARISON:  12/12/2013 10:17 a.m.  FINDINGS: Right central line is malpositioned directed laterally with the tip overlying the right axilla. This needs to be repositioned. No gross pneumothorax. Patient's chin overlies the lung apices limiting evaluation for detection of tiny pneumothorax.  Asymmetric airspace disease greater on right minimally changed since prior exam. This may represent asymmetric pulmonary edema versus infectious infiltrate. Clinical correlation recommended.  Mild cardiomegaly.  Calcified mildly tortuous aorta.  IMPRESSION: Right central line is  malpositioned directed laterally with the tip overlying the right axilla. This needs to be repositioned. No gross pneumothorax. Patient's chin overlies the lung apices limiting evaluation for detection of tiny pneumothorax.  Asymmetric airspace disease greater on right minimally changed since prior exam. This may represent asymmetric pulmonary edema versus infectious infiltrate. Clinical correlation recommended.  These results were called by telephone at the time of interpretation on 12/12/2013 at 5:18 PM to Amy, patient's nurse, who verbally acknowledged these results.   Electronically Signed   By: Bridgett Larsson M.D.   On: 12/12/2013 17:21   Dg Chest Port 1 View  12/12/2013   CLINICAL DATA:  Fever and shortness of breath  EXAM: PORTABLE CHEST - 1 VIEW  COMPARISON:  DG CHEST 2V dated 08/24/2012; DG CHEST 1V PORT dated 08/11/2012; CT CHEST W/O CM dated 11/20/2011; DG CHEST 2 VIEW dated 04/07/2012  FINDINGS: The examination is degraded due to portable technique and kyphotic projection.  Grossly unchanged enlarged cardiac silhouette and mediastinal contours. Worsening coarse interstitial opacities within the right mid lung. Grossly unchanged bilateral perihilar and left basilar opacities, likely atelectasis or scar. No definite evidence of edema. No definite pneumothorax, though note, evaluation degraded secondary to patient's overlying chin. Grossly unchanged bones.  IMPRESSION: Degraded examination and demonstrates development of interstitial opacities within the right mid lung with differential considerations including early asymmetric pulmonary edema and (favored) developing atypical infection/aspiration.   Electronically Signed   By: Simonne Come M.D.   On: 12/12/2013 10:31    Scheduled Meds: . aspirin  81 mg Oral Daily  . atropine  0.5 mg Intravenous Once  . divalproex  125 mg Oral BID  . docusate sodium  100 mg Oral BID  . donepezil  5 mg Oral QHS  . dutasteride  0.5 mg Oral Daily  . enoxaparin (LOVENOX)  injection  40 mg Subcutaneous QHS  . fluticasone  1 puff Inhalation BID  . ipratropium-albuterol  3 mL Nebulization QID  . mirtazapine  7.5 mg Oral QHS  . oseltamivir  30 mg Oral BID  . pantoprazole  40 mg Oral Daily  . piperacillin-tazobactam (ZOSYN)  IV  3.375 g Intravenous Q8H  . polyethylene glycol  17 g Oral Daily  . senna  2 tablet Oral QHS  . sertraline  50 mg Oral Daily  . tamsulosin  0.4 mg Oral Daily  . temazepam  30 mg Oral QHS  . vancomycin  500 mg Intravenous Q12H   Continuous Infusions: . sodium chloride 75 mL/hr at 12/13/13 0930  . DOPamine      Active Problems:   HYPERTENSION, BENIGN   Atrial fibrillation   DIASTOLIC HEART FAILURE, CHRONIC   COPD (chronic obstructive pulmonary disease)   Aspiration pneumonia   Nausea and vomiting    Time spent: 30 min    Latresha Yahr, [facility]  [facility] Pager [phone]. If 7PM-7AM, please contact night-coverage at [email or web address], password [facility] 12/13/2013, 2:14 PM  LOS: 1 day

## 2013-12-13 NOTE — Progress Notes (Signed)
CARE MANAGEMENT NOTE 12/13/2013  Patient:  [name],[name]   Account Number:  [id number]  Date Initiated:  12/13/2013  Documentation initiated by:  DAVIS,RHONDA  Subjective/Objective Assessment:   pt with confirmed pna, required vasopressorsdue hypotension, bradycardic event /     Action/Plan:   [facility]   Anticipated DC Date:  12/16/2013   Anticipated DC Plan:  SKILLED NURSING FACILITY  In-house referral  Clinical Social Worker      DC Planning Services  NA      [facility]  NA   Choice offered to / List presented to:  NA   DME arranged  NA      DME agency  NA     HH arranged  NA      HH agency  NA   Status of service:  In process, will continue to follow Medicare Important Message given?  NA - LOS <3 / Initial given by admissions (If response is "NO", the following Medicare IM given date fields will be blank) Date Medicare IM given:   Date Additional Medicare IM given:    Discharge Disposition:    Per UR Regulation:  Reviewed for med. necessity/level of care/duration of stay  If discussed at Long Length of Stay Meetings, dates discussed:    Comments:  [id number]/Rhonda Stark JockDavis, RN, BSN, [place] [phone] Chart Reviewed for discharge and hospital needs. Discharge needs at time of review:  None present will follow for needs. Review of patient progress due on [id number].

## 2013-12-13 NOTE — Progress Notes (Signed)
Name: [name] MRN: [id number] DOB: 10-01-1922    ADMISSION DATE:  12/12/2013 CONSULTATION DATE:  12/13/2013  REFERRING MD :  Malachi Bonds PRIMARY SERVICE: [facility]  CHIEF COMPLAINT:  hypotension  BRIEF PATIENT DESCRIPTION: 80 y.o , resident of assisted living presents with fever, nausea vomiting, RT infiltrate on CXR & hypotension not responding to fluids, lactate 2.0. PMH -chronic back pain currently wheelchair bound, esophageal diverticulum and dysphagia, mild dementia , PAF, chronic diastolic heart failure, h/o bradycardic arrest 2013   SIGNIFICANT EVENTS / STUDIES:    LINES / TUBES:   CULTURES: bld 31/ >> Urine 3/1 >> 3/1 flu panel>>neg  ANTIBIOTICS: Zosyn 3/1 >> vanc 3/1 >> 3/1 tamiflu>>   HISTORY OF PRESENT ILLNESS:   80 y.o , resident of assisted living presents with fever, nausea vomiting, RT infiltrate on CXR & hypotension not responding to fluids, lactate 2.0. He reports vomiting x 1 day, similar episopde 2 wks ago, no abd pain or nausea. Febrile 101 in ED, hypotensive, mild hypoxia started on [place], 2 L fluid but bp remained in 70s, started on dopamine CXR showed rt infx, does not want foley He is a retired Forensic scientist. He smoked 3 packs per day until he quit in 1955   SUBJECTIVE:  Alert, HOH, off pressors.  VITAL SIGNS: Temp:  [97.8 F (36.6 C)-101.7 F (38.7 C)] 97.8 F (36.6 C) (03/02 0752) Pulse Rate:  [51-93] 77 (03/02 0700) Resp:  [9-32] 26 (03/02 0700) BP: (73-149)/(31-79) 131/79 mmHg (03/02 0700) SpO2:  [95 %-100 %] 100 % (03/02 0752) Weight:  [60.4 kg (133 lb 2.5 oz)-65.772 kg (145 lb)] 61.8 kg (136 lb 3.9 oz) (03/02 0000) HEMODYNAMICS:   VENTILATOR SETTINGS:   INTAKE / OUTPUT: Intake/Output     03/01 0701 - 03/02 0700 03/02 0701 - 03/03 0700   P.O. 40    I.V. (mL/kg) 3095.9 (50.1) 125 (2)   IV Piggyback 1200    Total Intake(mL/kg) 4335.9 (70.2) 125 (2)   Urine (mL/kg/hr) 400    Total Output 400     Net +3935.9 +125        Urine  Occurrence 2 x      PHYSICAL EXAMINATION: Gen. Pleasant, well-nourished, in no distress, normal affect. HOH. kyphotic Neck: No JVD, LAN Lungs: no use of accessory muscles, no dullness to percussion, clear without rales or rhonchi . Diminished in bases. Very kyphotic  Cardiovascular: Rhythm irregular, no murmur Abdomen: soft and non-tender, no hepatosplenomegaly, BS normal. Musculoskeletal: No deformities, no cyanosis or clubbing Neuro:  alert, non focal Skin:  Warm, no lesions/ rash   LABS:  CBC  Recent Labs Lab 12/12/13 1000 12/13/13 0400  WBC 9.6 7.2  HGB 9.1* 7.1*  HCT 26.5* 21.2*  PLT 542* 461*   Coag's No results found for this basename: APTT, INR,  in the last 168 hours BMET  Recent Labs Lab 12/12/13 1000 12/13/13 0400  NA 140 143  K 4.3 3.2*  CL 98 107  CO2 27 24  BUN 30* 22  CREATININE 0.69 0.69  GLUCOSE 100* 103*   Electrolytes  Recent Labs Lab 12/12/13 1000 12/13/13 0400  CALCIUM 9.3 8.0*  MG  --  1.7   Sepsis Markers  Recent Labs Lab 12/12/13 1037 12/12/13 1609 12/13/13 0400  LATICACIDVEN 2.07 1.1 0.8   ABG No results found for this basename: PHART, PCO2ART, PO2ART,  in the last 168 hours Liver Enzymes  Recent Labs Lab 12/12/13 1000  AST 18  ALT 16  ALKPHOS  92  BILITOT 0.9  ALBUMIN 3.5   Cardiac Enzymes  Recent Labs Lab 12/12/13 1609 12/12/13 2139 12/13/13 0339  TROPONINI <0.30 <0.30 <0.30   Glucose No results found for this basename: GLUCAP,  in the last 168 hours  Imaging Dg Chest Port 1 View  12/13/2013   CLINICAL DATA:  Pneumonia.  EXAM: PORTABLE CHEST - 1 VIEW  COMPARISON:  Chest x-ray from yesterday  FINDINGS: Unchanged patchy bilateral airspace disease, most notable in the peripheral right chest. Unchanged malpositioning of the right IJ catheter, directed towards the right axilla.  Hypoaeration. No effusion or pneumothorax visible. Unchanged cardiomegaly.  IMPRESSION: 1. Persistent malpositioning of the right IJ  catheter, tip directed to the axilla. 2. Unchanged patchy airspace disease compatible with history of pneumonia.   Electronically Signed   By: Tiburcio Pea M.D.   On: 12/13/2013 06:14   Dg Chest Port 1 View  12/12/2013   CLINICAL DATA:  Central line placement.  EXAM: PORTABLE CHEST - 1 VIEW  COMPARISON:  12/12/2013 10:17 a.m.  FINDINGS: Right central line is malpositioned directed laterally with the tip overlying the right axilla. This needs to be repositioned. No gross pneumothorax. Patient's chin overlies the lung apices limiting evaluation for detection of tiny pneumothorax.  Asymmetric airspace disease greater on right minimally changed since prior exam. This may represent asymmetric pulmonary edema versus infectious infiltrate. Clinical correlation recommended.  Mild cardiomegaly.  Calcified mildly tortuous aorta.  IMPRESSION: Right central line is malpositioned directed laterally with the tip overlying the right axilla. This needs to be repositioned. No gross pneumothorax. Patient's chin overlies the lung apices limiting evaluation for detection of tiny pneumothorax.  Asymmetric airspace disease greater on right minimally changed since prior exam. This may represent asymmetric pulmonary edema versus infectious infiltrate. Clinical correlation recommended.  These results were called by telephone at the time of interpretation on 12/12/2013 at 5:18 PM to Amy, patient's nurse, who verbally acknowledged these results.   Electronically Signed   By: Bridgett Larsson M.D.   On: 12/12/2013 17:21   Dg Chest Port 1 View  12/12/2013   CLINICAL DATA:  Fever and shortness of breath  EXAM: PORTABLE CHEST - 1 VIEW  COMPARISON:  DG CHEST 2V dated 08/24/2012; DG CHEST 1V PORT dated 08/11/2012; CT CHEST W/O CM dated 11/20/2011; DG CHEST 2 VIEW dated 04/07/2012  FINDINGS: The examination is degraded due to portable technique and kyphotic projection.  Grossly unchanged enlarged cardiac silhouette and mediastinal contours. Worsening  coarse interstitial opacities within the right mid lung. Grossly unchanged bilateral perihilar and left basilar opacities, likely atelectasis or scar. No definite evidence of edema. No definite pneumothorax, though note, evaluation degraded secondary to patient's overlying chin. Grossly unchanged bones.  IMPRESSION: Degraded examination and demonstrates development of interstitial opacities within the right mid lung with differential considerations including early asymmetric pulmonary edema and (favored) developing atypical infection/aspiration.   Electronically Signed   By: Simonne Come M.D.   On: 12/12/2013 10:31     CXR:  3/2: 1. Persistent malpositioning of the right IJ catheter, tip directed  to the axilla.  2. Unchanged patchy airspace disease compatible with history of  pneumonia.    ASSESSMENT / PLAN:  PULMONARY A:Rt HCAP P:   O2 Diagonal abx as per ID section    CARDIOVASCULAR A: Chronic diastolic heart failure Atrial fibn on amiodarone and bradycardia EKG- RBBB with evolved IWMI (chronic) Septic shock P:  Wean dopamine as able May need cardiology consultation regarding his brady (at  times into the 20's  RENAL A:  Oliguria P:   Refuses foley, placed 3/2 with 1000 cc in bladder May need urology consult, will follow UOP  GASTROINTESTINAL A:  N/V P:   Follow,   HEMATOLOGIC A:  No issues P:  Follow CBC  INFECTIOUS A:  Rt HCAP / aspiration P:   Zosyn/ vanc empiric  ENDOCRINE A:  No issues   P:     NEUROLOGIC A:  Mild dementia P:   Appears lucid & able to make decisions  TODAY'S SUMMARY: Presumed septic shock vs dehydration from vomiting, cover for HCAP & treat with fluids/ pressors. Foley discussed for retention but pt refused. Would leave cvl alone may be able to dc line soon.   Brett CanalesSteve Minor ACNP [facility] Pager [phone] till 3 pm If no answer page [phone] 12/13/2013, 10:34 AM   Levy Pupaobert Justun Anaya, MD, PhD 12/13/2013, 2:37 PM [facility] [phone] or if no answer [phone]

## 2013-12-13 NOTE — Consult Note (Addendum)
Reason for Consult: bradycardia  Requesting Physician: Dr Sheran Fava  HPI: This is a 80 y.o. male, frail, lives at an assisted living facility, with a past medical history significant for PAF, RBBB, 1st degree AVB. He has had episodes of bradycardia in the past felt to be associated with vagal stimulation. He saw Dr Caryl Comes in Nov 2013 and at that time he felt that no pacemaker was indicated. The pt was admitted from his care facility 3/115 with HCAP, sepsis, shock requiring pressors. Today he had bradycardia into the 20s. This resolved spontaneously and the pt denies any symptoms. He also denies any history of syncope.    PMHx:  Past Medical History  Diagnosis Date  . Atrial fibrillation with slow ventricular response     Symptoms of weakness  . Paroxysmal atrial fibrillation     Hx of, currently on amiodarone load  . Syncope     Probably related to sinus pause, finally conversion from atrial fibrillation to sinus rhythm  . COPD (chronic obstructive pulmonary disease)   . Hypertension   . Abnormal CXR 07/06/2007; 06/15/2010    Eleveted R HD 07/06/2007 > no change 09/92/2011  . Chronic back pain   . 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 Joint replacement      right knee  . Appendectomy      FAMHx: Cancer, Alzheimers   SOCHx:  reports that he quit smoking about 60 years ago. He does not have any smokeless tobacco history on file. He reports that he drinks about 3.5 ounces of alcohol per week. He reports that he does not use illicit drugs.  ALLERGIES: Allergies  Allergen Reactions  . Lisinopril Other (See Comments)    Unknown, listed on MAR    ROS: A comprehensive review of systems was negative, except where noted above. See H&P for complete ROS  HOME MEDICATIONS: Prescriptions prior to admission  Medication Sig Dispense Refill  . amiodarone (PACERONE) 200 MG tablet Take 1 tablet (200 mg total)  by mouth daily.  30 tablet  1  . aspirin 81 MG tablet Take 81 mg by mouth daily.      . Calcium Carb-Cholecalciferol (CALCIUM + D3) 600-200 MG-UNIT TABS Take 1 tablet by mouth daily.      . Cholecalciferol (VITAMIN D-3) 1000 UNITS CAPS Take by mouth. Take once daily      . divalproex (DEPAKOTE SPRINKLE) 125 MG capsule 125 mg 2 (two) times daily.       Marland Kitchen donepezil (ARICEPT) 5 MG tablet Take 5 mg by mouth at bedtime.       . dutasteride (AVODART) 0.5 MG capsule Take 0.5 mg by mouth daily.      Marland Kitchen FLOVENT HFA 110 MCG/ACT inhaler Inhale 1 puff into the lungs 2 (two) times daily.       . furosemide (LASIX) 40 MG tablet Take 40 mg by mouth daily.      . Melatonin 5 MG TABS Take 5 mg by mouth daily.      . mineral oil liquid Place 2 drops into each ear once a week.      . mirtazapine (REMERON) 7.5 MG tablet Take 1 tablet (7.5 mg total) by mouth at bedtime.      Marland Kitchen omeprazole (PRILOSEC) 20 MG capsule Take 20 mg by mouth daily.      . sertraline (ZOLOFT) 50 MG tablet Take 50 mg by mouth daily.       Marland Kitchen  Tamsulosin HCl (FLOMAX) 0.4 MG CAPS Take 0.4 mg by mouth daily.      . temazepam (RESTORIL) 30 MG capsule Take 30 mg by mouth at bedtime.      . polyethylene glycol (MIRALAX / GLYCOLAX) packet Take 17 g by mouth daily as needed for mild constipation.       . sodium chloride (OCEAN) 0.65 % nasal spray Place 2 sprays into the nose 3 (three) times daily as needed. For dryness.        HOSPITAL MEDICATIONS: I have reviewed the patient's current medications.  VITALS: Blood pressure 102/42, pulse 54, temperature 98.6 F (37 C), temperature source Core (Comment), resp. rate 19, height '5\' 9"'  (1.753 m), weight 127 lb 13.9 oz (58 kg), SpO2 98.00%.  PHYSICAL EXAM: General appearance: alert, cooperative, no distress and frail, poor dentition Neck: no carotid bruit and no JVD Lungs: decreased breath sounds Heart: regular rate and rhythm and decreased heart sounds Abdomen: soft, non-tender; bowel sounds normal;  no masses,  no organomegaly and supra pibic surgical scar Extremities: chronic venous changes, trace edema Pulses: diminnished Skin: pale, cool, dry Neurologic: Grossly normal  LABS: Results for orders placed during the hospital encounter of 12/12/13 (from the past 48 hour(s))  URINALYSIS, ROUTINE W REFLEX MICROSCOPIC     Status: None   Collection Time    12/12/13  9:58 AM      Result Value Ref Range   Color, Urine YELLOW  YELLOW   APPearance CLEAR  CLEAR   Specific Gravity, Urine 1.018  1.005 - 1.030   pH 5.0  5.0 - 8.0   Glucose, UA NEGATIVE  NEGATIVE mg/dL   Hgb urine dipstick NEGATIVE  NEGATIVE   Bilirubin Urine NEGATIVE  NEGATIVE   Ketones, ur NEGATIVE  NEGATIVE mg/dL   Protein, ur NEGATIVE  NEGATIVE mg/dL   Urobilinogen, UA 1.0  0.0 - 1.0 mg/dL   Nitrite NEGATIVE  NEGATIVE   Leukocytes, UA NEGATIVE  NEGATIVE   Comment: MICROSCOPIC NOT DONE ON URINES WITH NEGATIVE PROTEIN, BLOOD, LEUKOCYTES, NITRITE, OR GLUCOSE <1000 mg/dL.  URINE CULTURE     Status: None   Collection Time    12/12/13  9:58 AM      Result Value Ref Range   Specimen Description URINE, CATHETERIZED     Special Requests NONE     Culture  Setup Time       Value: 12/12/2013 19:18     Performed at [place]       Value: NO GROWTH     Performed at [facility]   Culture       Value: NO GROWTH     Performed at [facility]   Report Status 12/13/2013 FINAL    CBC WITH DIFFERENTIAL     Status: Abnormal   Collection Time    12/12/13 10:00 AM      Result Value Ref Range   WBC 9.6  4.0 - 10.5 K/uL   RBC 2.28 (*) 4.22 - 5.81 MIL/uL   Hemoglobin 9.1 (*) 13.0 - 17.0 g/dL   HCT 26.5 (*) 39.0 - 52.0 %   MCV 116.2 (*) 78.0 - 100.0 fL   MCH 39.9 (*) 26.0 - 34.0 pg   MCHC 34.3  30.0 - 36.0 g/dL   RDW 16.1 (*) 11.5 - 15.5 %   Platelets 542 (*) 150 - 400 K/uL   Neutrophils Relative % 83 (*) 43 - 77 %   Lymphocytes Relative 8 (*)  12 - 46 %   Monocytes Relative 9  3 - 12 %    Eosinophils Relative 0  0 - 5 %   Basophils Relative 0  0 - 1 %   Neutro Abs 7.9 (*) 1.7 - 7.7 K/uL   Lymphs Abs 0.8  0.7 - 4.0 K/uL   Monocytes Absolute 0.9  0.1 - 1.0 K/uL   Eosinophils Absolute 0.0  0.0 - 0.7 K/uL   Basophils Absolute 0.0  0.0 - 0.1 K/uL   RBC Morphology POLYCHROMASIA PRESENT    COMPREHENSIVE METABOLIC PANEL     Status: Abnormal   Collection Time    12/12/13 10:00 AM      Result Value Ref Range   Sodium 140  137 - 147 mEq/L   Potassium 4.3  3.7 - 5.3 mEq/L   Chloride 98  96 - 112 mEq/L   CO2 27  19 - 32 mEq/L   Glucose, Bld 100 (*) 70 - 99 mg/dL   BUN 30 (*) 6 - 23 mg/dL   Creatinine, Ser 0.69  0.50 - 1.35 mg/dL   Calcium 9.3  8.4 - 10.5 mg/dL   Total Protein 7.2  6.0 - 8.3 g/dL   Albumin 3.5  3.5 - 5.2 g/dL   AST 18  0 - 37 U/L   ALT 16  0 - 53 U/L   Alkaline Phosphatase 92  39 - 117 U/L   Total Bilirubin 0.9  0.3 - 1.2 mg/dL   GFR calc non Af Amer 82 (*) >90 mL/min   GFR calc Af Amer >90  >90 mL/min   Comment: (NOTE)     The eGFR has been calculated using the CKD EPI equation.     This calculation has not been validated in all clinical situations.     eGFR's persistently <90 mL/min signify possible Chronic Kidney     Disease.  CULTURE, BLOOD (ROUTINE X 2)     Status: None   Collection Time    12/12/13 10:00 AM      Result Value Ref Range   Specimen Description BLOOD BLOOD RIGHT FOREARM     Special Requests BOTTLES DRAWN AEROBIC AND ANAEROBIC 5CC EACH     Culture  Setup Time       Value: 12/12/2013 17:17     Performed at [facility]   Culture       Value:        BLOOD CULTURE RECEIVED NO GROWTH TO DATE CULTURE WILL BE HELD FOR 5 DAYS BEFORE ISSUING A FINAL NEGATIVE REPORT     Performed at [facility]   Report Status PENDING    LIPASE, BLOOD     Status: None   Collection Time    12/12/13 10:00 AM      Result Value Ref Range   Lipase 17  11 - 59 U/L  I-STAT CG4 LACTIC ACID, ED     Status: None   Collection Time    12/12/13  10:37 AM      Result Value Ref Range   Lactic Acid, Venous 2.07  0.5 - 2.2 mmol/L  CULTURE, BLOOD (ROUTINE X 2)     Status: None   Collection Time    12/12/13 11:05 AM      Result Value Ref Range   Specimen Description BLOOD BLOOD LEFT FOREARM     Special Requests BOTTLES DRAWN AEROBIC AND ANAEROBIC 5CC EACH     Culture  Setup Time  Value: 12/12/2013 17:17     Performed at [facility]   Culture       Value:        BLOOD CULTURE RECEIVED NO GROWTH TO DATE CULTURE WILL BE HELD FOR 5 DAYS BEFORE ISSUING A FINAL NEGATIVE REPORT     Performed at [facility]   Report Status PENDING    INFLUENZA PANEL BY PCR (TYPE A & B, H1N1)     Status: None   Collection Time    12/12/13  2:01 PM      Result Value Ref Range   Influenza A By PCR NEGATIVE  NEGATIVE   Influenza B By PCR NEGATIVE  NEGATIVE   H1N1 flu by pcr NOT DETECTED  NOT DETECTED   Comment:            The Xpert Flu assay (FDA approved for     nasal aspirates or washes and     nasopharyngeal swab specimens), is     intended as an aid in the diagnosis of     influenza and should not be used as     a sole basis for treatment.     Performed at [facility]  MRSA PCR SCREENING     Status: None   Collection Time    12/12/13  3:33 PM      Result Value Ref Range   MRSA by PCR NEGATIVE  NEGATIVE   Comment:            The GeneXpert MRSA Assay (FDA     approved for NASAL specimens     only), is one component of a     comprehensive MRSA colonization     surveillance program. It is not     intended to diagnose MRSA     infection nor to guide or     monitor treatment for     MRSA infections.  HIV ANTIBODY (ROUTINE TESTING)     Status: None   Collection Time    12/12/13  4:09 PM      Result Value Ref Range   HIV NON REACTIVE  NON REACTIVE   Comment: Performed at [place] ACID, PLASMA     Status: None   Collection Time    12/12/13  4:09 PM      Result Value Ref Range   Lactic Acid,  Venous 1.1  0.5 - 2.2 mmol/L  TROPONIN I     Status: None   Collection Time    12/12/13  4:09 PM      Result Value Ref Range   Troponin I <0.30  <0.30 ng/mL   Comment:            Due to the release kinetics of cTnI,     a negative result within the first hours     of the onset of symptoms does not rule out     myocardial infarction with certainty.     If myocardial infarction is still suspected,     repeat the test at appropriate intervals.  CORTISOL     Status: None   Collection Time    12/12/13  4:09 PM      Result Value Ref Range   Cortisol, Plasma 13.0     Comment: (NOTE)     AM:  4.3 - 22.4 ug/dL     PM:  3.1 - 16.7 ug/dL     Performed at [place]  SCREEN     Status: None   Collection Time    12/12/13  4:09 PM      Result Value Ref Range   ABO/RH(D) A POS     Antibody Screen NEG     Sample Expiration 12/15/2013    CARBOXYHEMOGLOBIN     Status: Abnormal   Collection Time    12/12/13  5:30 PM      Result Value Ref Range   Total hemoglobin 13.1 (*) 13.5 - 18.0 g/dL   O2 Saturation 50.0     Carboxyhemoglobin 1.6 (*) 0.5 - 1.5 %   Methemoglobin 1.8 (*) 0.0 - 1.5 %  LEGIONELLA ANTIGEN, URINE     Status: None   Collection Time    12/12/13  6:19 PM      Result Value Ref Range   Specimen Description URINE, CLEAN CATCH     Special Requests NONE     Legionella Antigen, Urine       Value: Negative for Legionella pneumophilia serogroup 1     Performed at [facility]   Report Status 12/13/2013 FINAL    STREP PNEUMONIAE URINARY ANTIGEN     Status: None   Collection Time    12/12/13  6:19 PM      Result Value Ref Range   Strep Pneumo Urinary Antigen NEGATIVE  NEGATIVE   Comment: PERFORMED AT [facility]                Infection due to S. pneumoniae     cannot be absolutely ruled out     since the antigen present     may be below the detection limit     of the test.     Performed at [facility]  TROPONIN I     Status: None    Collection Time    12/12/13  9:39 PM      Result Value Ref Range   Troponin I <0.30  <0.30 ng/mL   Comment:            Due to the release kinetics of cTnI,     a negative result within the first hours     of the onset of symptoms does not rule out     myocardial infarction with certainty.     If myocardial infarction is still suspected,     repeat the test at appropriate intervals.  TROPONIN I     Status: None   Collection Time    12/13/13  3:39 AM      Result Value Ref Range   Troponin I <0.30  <0.30 ng/mL   Comment:            Due to the release kinetics of cTnI,     a negative result within the first hours     of the onset of symptoms does not rule out     myocardial infarction with certainty.     If myocardial infarction is still suspected,     repeat the test at appropriate intervals.  LACTIC ACID, PLASMA     Status: None   Collection Time    12/13/13  4:00 AM      Result Value Ref Range   Lactic Acid, Venous 0.8  0.5 - 2.2 mmol/L  BASIC METABOLIC PANEL     Status: Abnormal   Collection Time    12/13/13  4:00 AM      Result Value Ref Range   Sodium 143  137 - 147 mEq/L   Potassium 3.2 (*) 3.7 - 5.3 mEq/L   Comment: RESULT REPEATED AND VERIFIED     DELTA CHECK NOTED   Chloride 107  96 - 112 mEq/L   Comment: RESULT REPEATED AND VERIFIED     DELTA CHECK NOTED   CO2 24  19 - 32 mEq/L   Glucose, Bld 103 (*) 70 - 99 mg/dL   BUN 22  6 - 23 mg/dL   Creatinine, Ser 0.69  0.50 - 1.35 mg/dL   Calcium 8.0 (*) 8.4 - 10.5 mg/dL   GFR calc non Af Amer 82 (*) >90 mL/min   GFR calc Af Amer >90  >90 mL/min   Comment: (NOTE)     The eGFR has been calculated using the CKD EPI equation.     This calculation has not been validated in all clinical situations.     eGFR's persistently <90 mL/min signify possible Chronic Kidney     Disease.  CBC     Status: Abnormal   Collection Time    12/13/13  4:00 AM      Result Value Ref Range   WBC 7.2  4.0 - 10.5 K/uL   RBC 1.81 (*) 4.22  - 5.81 MIL/uL   Hemoglobin 7.1 (*) 13.0 - 17.0 g/dL   Comment: REPEATED TO VERIFY     DELTA CHECK NOTED   HCT 21.2 (*) 39.0 - 52.0 %   MCV 117.1 (*) 78.0 - 100.0 fL   MCH 39.2 (*) 26.0 - 34.0 pg   MCHC 33.5  30.0 - 36.0 g/dL   RDW 16.4 (*) 11.5 - 15.5 %   Platelets 461 (*) 150 - 400 K/uL  MAGNESIUM     Status: None   Collection Time    12/13/13  4:00 AM      Result Value Ref Range   Magnesium 1.7  1.5 - 2.5 mg/dL  PROCALCITONIN     Status: None   Collection Time    12/13/13  4:30 AM      Result Value Ref Range   Procalcitonin 0.87     Comment:            Interpretation:     PCT > 0.5 ng/mL and <= 2 ng/mL:     Systemic infection (sepsis) is possible,     but other conditions are known to elevate     PCT as well.     (NOTE)             ICU PCT Algorithm               Non ICU PCT Algorithm        ----------------------------     ------------------------------             PCT < 0.25 ng/mL                 PCT < 0.1 ng/mL         Stopping of antibiotics            Stopping of antibiotics           strongly encouraged.               strongly encouraged.        ----------------------------     ------------------------------           PCT level decrease by               PCT < 0.25 ng/mL           >=  80% from peak PCT           OR PCT 0.25 - 0.5 ng/mL          Stopping of antibiotics                                                 encouraged.         Stopping of antibiotics               encouraged.        ----------------------------     ------------------------------           PCT level decrease by              PCT >= 0.25 ng/mL           < 80% from peak PCT            AND PCT >= 0.5 ng/mL            Continuing antibiotics                                                  encouraged.           Continuing antibiotics                encouraged.        ----------------------------     ------------------------------         PCT level increase compared          PCT > 0.5 ng/mL              with peak PCT AND              PCT >= 0.5 ng/mL             Escalation of antibiotics                                              strongly encouraged.          Escalation of antibiotics            strongly encouraged.    EKG: NSR, 1st degree AVB, RBBB  IMAGING: Dg Chest Port 1 View  12/13/2013   CLINICAL DATA:  Pneumonia.  EXAM: PORTABLE CHEST - 1 VIEW  COMPARISON:  Chest x-ray from yesterday  FINDINGS: Unchanged patchy bilateral airspace disease, most notable in the peripheral right chest. Unchanged malpositioning of the right IJ catheter, directed towards the right axilla.  Hypoaeration. No effusion or pneumothorax visible. Unchanged cardiomegaly.  IMPRESSION: 1. Persistent malpositioning of the right IJ catheter, tip directed to the axilla. 2. Unchanged patchy airspace disease compatible with history of pneumonia.   Electronically Signed   By: Jorje Guild M.D.   On: 12/13/2013 06:14   Echo: 12/13/13- Study Conclusions  - Left ventricle: The cavity size was normal. Wall thickness was normal. Systolic function was normal. The estimated ejection fraction was in the range of 55% to 60%. - Aortic valve: Mild regurgitation. - Mitral valve: Calcified annulus. Mildly thickened leaflets . Mild regurgitation. - Left atrium:  The atrium was moderately dilated. - Atrial septum: No defect or patent foramen ovale was identified.    IMPRESSION: Active Problems:   Acute respiratory failure   Aspiration pneumonia   Bradycardia- transient, asymptomatic   HCAP (healthcare-associated pneumonia)   Septic shock(785.52)- on Dopamine   HYPERTENSION, BENIGN   COPD (chronic obstructive pulmonary disease)   Chronic diastolic CHF (congestive heart failure)   RBBB   1St degree AV block   Nausea and vomiting   RECOMMENDATION: When I discussed pacemaker with Mr [name] he was not inclined to pursue this. He says he was asymptomatic during the episode. He is on no rate affecting drugs that I can  see. He is a full code. MD to see  Time Spent Directly with Patient: 35 minutes  Erlene Quan [phone] beeper 12/13/2013, 3:47 PM   I personally seen and examined and agree with above. I reviewed previous note from Dr. Caryl Comes 09/11/12. At that time, no further cardiac workup was planned.  He has not experienced syncope, dizziness. He does have significant bradycardia and sinoatrial node dysfunction. At times when sleeping, heart rate drops into the 20s. It is likely that increased vagal tone during sleeping is precipitating significant bradycardia.  I had lengthy discussion with him about the risks, benefits of pacemaker placement, focusing on the potential benefits. He was not interested in pursuing pacemaker. He was curious as to what would happen if his heart were to become too slow to function or fibrillate. I described to him the potential clinical outcome, including death and he shook his head in understanding. After once again asking him if he would like for [place] to pursue pacemaker, he respectfully declined. I also respect his wishes.  If he changes his mind, we can always have Dr. Caryl Comes or one of his associates see him once again and consider pacemaker placement.   All AV nodal blocking agents have been discontinued. Amiodarone has been discontinued.  Consider weaning dopamine to off.  Agree with transfusion for hemoglobin less than 7.  We will continue to follow.  Candee Furbish, MD

## 2013-12-13 NOTE — Progress Notes (Signed)
[facility] FOR AM LAB REPLACEMENT ONLY  The patient does not apply for the [facility] Electrolyte Re[facility] Protocol based on the criteria listed below:   1. Is GFR >/= 40 ml/min? yes  Patient's GFR today is 82 2. Is urine output >/= 0.5 ml/kg/hr for the last 6 hours? no Patient's UOP is 0.2 ml/kg/hr 3. Is BUN < 60 mg/dL? yes  Patient's BUN today is 22 4. Abnormal electrolyte(s)K 3.2 5. Ordered repletion with: NA 6. If a panic level lab has been reported, has the CCM MD in charge been notified? yes.   Physician:  Boone MasterYacoub  [facility] 12/13/2013 6:26 AM

## 2013-12-13 NOTE — Evaluation (Signed)
SLP Cancellation Note  Patient Details Name: [name] MRN: [id number] DOB: [birth year]   Cancelled treatment:       Reason Eval/Treat Not Completed: Patient at procedure or test/unavailable (pt having dopplers done, will return for eval as schedule permits)   Mills KollerKimball, Tiffanyann Deroo Ann Gabryela Kimbrell, MS [facility] S[facility] [phone]

## 2013-12-13 NOTE — Evaluation (Signed)
Clinical/Bedside Swallow Evaluation Patient Details  Name: [name] MRN: [id number] Date of Birth: 03-Apr-1922  Today's Date: 12/13/2013 Time: 9562-1308 SLP Time Calculation (min): 32 min  Past Medical History:  Past Medical History  Diagnosis Date  . Atrial fibrillation with slow ventricular response     Symptoms of weakness  . Paroxysmal atrial fibrillation     Hx of, currently on amiodarone load  . Syncope     Probably related to sinus pause, finally conversion from atrial fibrillation to sinus rhythm  . COPD (chronic obstructive pulmonary disease)   . Hypertension   . Abnormal CXR 07/06/2007; 06/15/2010    Eleveted R HD 07/06/2007 > no change 09/92/2011  . Chronic back pain   . 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 Joint replacement      right knee  . Appendectomy     HPI:  80 yo male adm from [name] with N/V fevers.  Pt found to have right infiltrate on CXR + hypotension.  PMH + for heart failure, mild dementia, COPD, wheelchair bound, previous smoker stopped in 1955, dysphagia dating back to at least 2012.  Bse of swallow ordered.     Assessment / Plan / Recommendation Clinical Impression  Pt presents with functional oropharyngeal swallow based on clinical swallow evaluation - without s/s of aspiration with cracker, puree, thin.  Pt without lower dentures and therefore recommend continue dys3 diet.   Pt has h/o mild oral and cervical esophageal dysphagia dx in 09/2012 when on CIR for stay after bradycardiac arrest with deconditioning.  Known presbyesophagus and esophageal divertiulae.  Pt reports poor appetite in the last 2 years and states he eats a good breakfast, minimal lunch and no dinner.    He denies dysphagia symptoms and admits to laying down soon after eating. Pt further denies choking on emesis during vomiting episodes.  SLP advised pt to importance  to sit upright after meals with known dysphagia.  Also reviewed previous MBS and esophagram tests with pt and precautions recommended with clinical reasoning.  Pt reports he does not recall previously diagnosed dysphagia (which is different than in Dec 2013).    Recommend continue diet with precautions to mitigate chronic dysphagia.  SLP to follow up x1 to assure tolerance.      Aspiration Risk  Mild    Diet Recommendation Dysphagia 3 (Mechanical Soft);Thin liquid   Liquid Administration via: Straw Medication Administration: Whole meds with liquid Supervision: Patient able to self feed Compensations: Small sips/bites;Follow solids with liquid Postural Changes and/or Swallow Maneuvers: Seated upright 90 degrees;Upright 30-60 min after meal    Other  Recommendations Oral Care Recommendations: Oral care BID   Follow Up Recommendations    tbd   Frequency and Duration min 1 x/week  2 weeks   Pertinent Vitals/Pain Afebrile, decreased      Swallow Study Prior Functional Status   see hhx    General Date of Onset: 12/12/13 HPI: 80 yo male adm from [name] with N/V fevers.  Pt found to have right infiltrate on CXR + hypotension.  PMH + for heart failure, mild dementia, COPD, wheelchair bound, previous smoker stopped in 1955, dysphagia dating back to at least 2012.  Bse of swallow ordered.   Type of Study: Bedside swallow evaluation Previous Swallow Assessment: mbs 09/14/2012 mild oral and mild cervical esophageal dysphagia likely due to curvature of spine, known  esophageal diverticulae and esophageal dysmotility rec dys3/thin with follow solids with drinks and intermittent dry swallow to aid esoph clearance  Diet Prior to this Study: Dysphagia 3 (soft);Thin liquids Temperature Spikes Noted: No Respiratory Status: Nasal cannula History of Recent Intubation: No Behavior/Cognition: Alert;Cooperative;Pleasant mood Oral Cavity - Dentition: Dentures, not available;Adequate natural  dentition (upper two front teeth missing, lower has denture but not here and does not have someone to bring to hospital for him) Self-Feeding Abilities: Able to feed self Patient Positioning: Upright in bed Baseline Vocal Quality: Clear Volitional Cough: Strong Volitional Swallow: Able to elicit    Oral/Motor/Sensory Function Overall Oral Motor/Sensory Function: Appears within functional limits for tasks assessed   Ice Chips Ice chips: Not tested   Thin Liquid Thin Liquid: Within functional limits Presentation: Self Fed;Straw    Nectar Thick Nectar Thick Liquid: Not tested   Honey Thick Honey Thick Liquid: Not tested   Puree Puree: Within functional limits Presentation: Spoon   Solid   GO    Solid: Within functional limits Other Comments: mild compromised mastication likely due to lack of lower dentures       Donavan Burnetamara Randie Bloodgood, MS [facility] SLP [phone]

## 2013-12-14 ENCOUNTER — Inpatient Hospital Stay (HOSPITAL_COMMUNITY): Payer: Medicare Other

## 2013-12-14 DIAGNOSIS — I4891 Unspecified atrial fibrillation: Secondary | ICD-10-CM

## 2013-12-14 LAB — TSH: TSH: 0.28 u[IU]/mL — AB (ref 0.350–4.500)

## 2013-12-14 LAB — BASIC METABOLIC PANEL
BUN: 13 mg/dL (ref 6–23)
CO2: 26 mEq/L (ref 19–32)
CREATININE: 0.63 mg/dL (ref 0.50–1.35)
Calcium: 8.2 mg/dL — ABNORMAL LOW (ref 8.4–10.5)
Chloride: 110 mEq/L (ref 96–112)
GFR, EST NON AFRICAN AMERICAN: 85 mL/min — AB (ref >90)
Glucose, Bld: 103 mg/dL — ABNORMAL HIGH (ref 70–99)
Potassium: 3.8 mEq/L (ref 3.7–5.3)
Sodium: 145 mEq/L (ref 137–147)

## 2013-12-14 LAB — PHOSPHORUS: Phosphorus: 2.6 mg/dL (ref 2.3–4.6)

## 2013-12-14 LAB — CBC
HCT: 20.3 % — ABNORMAL LOW (ref 39.0–52.0)
Hemoglobin: 6.9 g/dL — CL (ref 13.0–17.0)
MCH: 40.4 pg — ABNORMAL HIGH (ref 26.0–34.0)
MCHC: 34 g/dL (ref 30.0–36.0)
MCV: 118.7 fL — ABNORMAL HIGH (ref 78.0–100.0)
PLATELETS: 408 10*3/uL — AB (ref 150–400)
RBC: 1.71 MIL/uL — ABNORMAL LOW (ref 4.22–5.81)
RDW: 16.6 % — ABNORMAL HIGH (ref 11.5–15.5)
WBC: 5 10*3/uL (ref 4.0–10.5)

## 2013-12-14 LAB — FERRITIN: FERRITIN: 246 ng/mL (ref 22–322)

## 2013-12-14 LAB — PREPARE RBC (CROSSMATCH)

## 2013-12-14 LAB — IRON AND TIBC
Iron: 57 ug/dL (ref 42–135)
Saturation Ratios: 34 % (ref 20–55)
TIBC: 170 ug/dL — ABNORMAL LOW (ref 215–435)
UIBC: 113 ug/dL — ABNORMAL LOW (ref 125–400)

## 2013-12-14 LAB — VITAMIN B12: Vitamin B-12: 270 pg/mL (ref 211–911)

## 2013-12-14 LAB — MAGNESIUM: MAGNESIUM: 2.1 mg/dL (ref 1.5–2.5)

## 2013-12-14 LAB — TRANSFERRIN: TRANSFERRIN: 125 mg/dL — AB (ref 200–360)

## 2013-12-14 LAB — FOLATE RBC: RBC Folate: 1325 ng/mL — ABNORMAL HIGH (ref >280)

## 2013-12-14 LAB — PROCALCITONIN: Procalcitonin: 0.42 ng/mL

## 2013-12-14 MED ORDER — ENSURE COMPLETE PO LIQD
237.0000 mL | Freq: Two times a day (BID) | ORAL | Status: DC
  Administered 2013-12-14: 237 mL via ORAL

## 2013-12-14 MED ORDER — ENSURE COMPLETE PO LIQD
237.0000 mL | Freq: Three times a day (TID) | ORAL | Status: DC
  Administered 2013-12-14 – 2013-12-17 (×9): 237 mL via ORAL

## 2013-12-14 NOTE — Progress Notes (Addendum)
[facility] PROGRESS NOTE  [name] UJW:[id number] DOB: April 11, 1922 DOA: 12/12/2013 PCP: Ginette Otto, MD  Brief summary  The patient is a 80 y.o. year-old male with history of PAF, chronic diastolic heart failure, COPD, HTN, chronic back pain currently wheelchair bound, esophageal diverticulum and dysphagia, and mild dementia who presented with nausea, vomiting, and fever from [facility].  He was found to have septic shock secondary to aspiration pneumonia and bradycardia.  Still on dopamine.  Full code.    Assessment/Plan  Acute hypoxic respiratory failure and septic vs. hypovolemic shock secondary to HCAP vs. Aspiration pneumonia  - Sputum culture not obtained - Urine legionella Ag negative - Urine S. 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  Episode of bradycardia with hypotension improved with atropine in emergency department -  Bradycardia resolving, HR now in 80s -  TSH pending -  Appreciate Cardiology assistance - Cont ASA  - Amiodarone discontinued - Wean dopamine gtt as tolerated  Nausea and vomiting, unclear etiology. LFTs, lipase, and UA neg  - May be related to viral syndrome such as flu or secondary to primary process of pneumonia  - zofran prn   Chronic diastolic heart failure, possible cardiomegaly and pulmonary edema, appears euvolemic today, good uop yesterday - d/c IV fluids - Hold lasix  - ECHO:  Ejection fraction of 55-60%, mild aortic regurg, left atrium moderately dilated  HTN, blood pressure low normal, not on BP medications   COPD, not on medication, distant smoking  hx and not wheezing. Continue duonebs prn   Dysphagia and esophageal diverticulum  - Dysphasia 3 with thin liquids - Appreciate Speech therapy   Depression and dementia, stable. Continue home meds.  Acute urinary retention, continue foley  Macrocytic anemia, decreasing hgb -  Iron studies pending -  B12 pending -  Folate pending -  TSH pending -  Occult stool not obtained -  Transfuse 1 unit PRBC  Hypokalemia and hypomagnesemia resolved.    Diet:  Dysphagia 3 Access:  Malposition Central line, placed arch first IVF:  Yes Proph:  Lovenox  Code Status: Full Family Communication: Patient alone.  Offered palliative care consult to establish goals of care with family, however,  Disposition Plan:  Weaning dopamine as tolerated.  Start OOB, and PT/OT  Consultants:  Critical care  Cardiology  Procedures:  Chest x-ray  Antibiotics:  Vancomycin from 3/1  Zosyn from 3/1   HPI/Subjective:  Patient sleepy.  Denies SOB, severe cough, wheezing, nausea.  Was not hungry yesterday and did not eat.  Has not been OOB.    Objective: Filed Vitals:   12/14/13 0500 12/14/13 0600 12/14/13 0800 12/14/13 0823  BP: 114/63 126/62 108/61   Pulse: 87 87 77   Temp: 98.8 F (37.1 C) 98.8 F (37.1 C) 98.8 F (37.1 C)   TempSrc:   Core (Comment)   Resp: 18 22 21    Height:      Weight:      SpO2: 100% 97% 98% 100%    Intake/Output Summary (Last 24 hours) at 12/14/13 0902 Last data filed at 12/14/13 0800  Gross per 24  hour  Intake 3289.16 ml  Output   2325 ml  Net 964.16 ml   Filed Weights   12/13/13 0000 12/13/13 1240 12/14/13 0400  Weight: 61.8 kg (136 lb 3.9 oz) 58 kg (127 lb 13.9 oz) 61.7 kg (136 lb 0.4 oz)    Exam:   General:  Thin Caucasian male, No acute distress  HEENT:  NCAT, MMM  Cardiovascular:  RRR, nl S1, S2 no mrg, 2+ pulses, warm extremities  Respiratory:  Rales at the bilateral bases, no rhonchi or wheeze, no increased WOB  Abdomen:   NABS, soft,  NT/ND  MSK:   Normal tone and bulk, no LEE  Neuro:  Grossly intact  Data Reviewed: Basic Metabolic Panel:  Recent Labs Lab 12/12/13 1000 12/13/13 0400 12/14/13 0425  NA 140 143 145  K 4.3 3.2* 3.8  CL 98 107 110  CO2 27 24 26   GLUCOSE 100* 103* 103*  BUN 30* 22 13  CREATININE 0.69 0.69 0.63  CALCIUM 9.3 8.0* 8.2*  MG  --  1.7 2.1  PHOS  --   --  2.6   Liver Function Tests:  Recent Labs Lab 12/12/13 1000  AST 18  ALT 16  ALKPHOS 92  BILITOT 0.9  PROT 7.2  ALBUMIN 3.5    Recent Labs Lab 12/12/13 1000  LIPASE 17   No results found for this basename: AMMONIA,  in the last 168 hours CBC:  Recent Labs Lab 12/12/13 1000 12/13/13 0400 12/13/13 1900 12/14/13 0425  WBC 9.6 7.2 5.2 5.0  NEUTROABS 7.9*  --   --   --   HGB 9.1* 7.1* 7.0* 6.9*  HCT 26.5* 21.2* 20.5* 20.3*  MCV 116.2* 117.1* 117.1* 118.7*  PLT 542* 461* 423* 408*   Cardiac Enzymes:  Recent Labs Lab 12/12/13 1609 12/12/13 2139 12/13/13 0339  TROPONINI <0.30 <0.30 <0.30   BNP (last 3 results) No results found for this basename: PROBNP,  in the last 8760 hours CBG: No results found for this basename: GLUCAP,  in the last 168 hours  Recent Results (from the past 240 hour(s))  URINE CULTURE     Status: None   Collection Time    12/12/13  9:58 AM      Result Value Ref Range Status   Specimen Description URINE, CATHETERIZED   Final   Special Requests NONE   Final   Culture  Setup Time     Final   Value: 12/12/2013 19:18     [facility]   Colony Count     Final   Value: NO GROWTH     [facility]   Culture     Final   Value: NO GROWTH     Performed at [facility]   Report Status 12/13/2013 FINAL   Final  CULTURE, BLOOD (ROUTINE X 2)     Status: None   Collection Time    12/12/13 10:00 AM      Result Value Ref Range Status   Specimen Description BLOOD BLOOD RIGHT FOREARM   Final   Special Requests BOTTLES DRAWN AEROBIC AND  ANAEROBIC 5CC EACH   Final   Culture  Setup Time     Final   Value: 12/12/2013 17:17     Performed at [facility]   Culture     Final   Value:        BLOOD CULTURE RECEIVED NO GROWTH TO DATE CULTURE WILL BE HELD FOR 5 DAYS  BEFORE ISSUING A FINAL NEGATIVE REPORT     Performed at [facility]   Report Status PENDING   Incomplete  CULTURE, BLOOD (ROUTINE X 2)     Status: None   Collection Time    12/12/13 11:05 AM      Result Value Ref Range Status   Specimen Description BLOOD BLOOD LEFT FOREARM   Final   Special Requests BOTTLES DRAWN AEROBIC AND ANAEROBIC 5CC EACH   Final   Culture  Setup Time     Final   Value: 12/12/2013 17:17     Performed at [facility]   Culture     Final   Value:        BLOOD CULTURE RECEIVED NO GROWTH TO DATE CULTURE WILL BE HELD FOR 5 DAYS BEFORE ISSUING A FINAL NEGATIVE REPORT     Performed at [facility]   Report Status PENDING   Incomplete  MRSA PCR SCREENING     Status: None   Collection Time    12/12/13  3:33 PM      Result Value Ref Range Status   MRSA by PCR NEGATIVE  NEGATIVE Final   Comment:            The GeneXpert MRSA Assay (FDA     approved for NASAL specimens     only), is one component of a     comprehensive MRSA colonization     surveillance program. It is not     intended to diagnose MRSA     infection nor to guide or     monitor treatment for     MRSA infections.     Studies: Dg Chest Port 1 View  12/14/2013   CLINICAL DATA:  Pneumonia.  Central line.  EXAM: PORTABLE CHEST - 1 VIEW  COMPARISON:  12/13/2013  FINDINGS: Right jugular Central venous catheter extends into the right axillary vein and is unchanged. No pneumothorax  Hypoventilation with decreased lung volume and bibasilar atelectasis.  Diffuse bilateral airspace disease may represent pneumonia. This also is unchanged.  Left lower lobe consolidation is slightly more prominent may be due to infiltrate and effusion.  IMPRESSION: Right jugular  catheter tip extends into the right axillary vein unchanged  Diffuse bilateral airspace disease may represent ARDS or pneumonia.  Increase in left lower lobe opacity.   Electronically Signed   By: Marlan Palau M.D.   On: 12/14/2013 07:22   Dg Chest Port 1 View  12/13/2013   CLINICAL DATA:  Pneumonia.  EXAM: PORTABLE CHEST - 1 VIEW  COMPARISON:  Chest x-ray from yesterday  FINDINGS: Unchanged patchy bilateral airspace disease, most notable in the peripheral right chest. Unchanged malpositioning of the right IJ catheter, directed towards the right axilla.  Hypoaeration. No effusion or pneumothorax visible. Unchanged cardiomegaly.  IMPRESSION: 1. Persistent malpositioning of the right IJ catheter, tip directed to the axilla. 2. Unchanged patchy airspace disease compatible with history of pneumonia.   Electronically Signed   By: Tiburcio Pea M.D.   On: 12/13/2013 06:14   Dg Chest Port 1 View  12/12/2013   CLINICAL DATA:  Central line placement.  EXAM: PORTABLE CHEST - 1 VIEW  COMPARISON:  12/12/2013 10:17 a.m.  FINDINGS: Right central line is malpositioned directed laterally with the tip overlying the right axilla. This needs to be repositioned. No gross pneumothorax. Patient's chin overlies the lung apices limiting evaluation for detection of tiny pneumothorax.  Asymmetric airspace disease greater on right minimally changed since prior  exam. This may represent asymmetric pulmonary edema versus infectious infiltrate. Clinical correlation recommended.  Mild cardiomegaly.  Calcified mildly tortuous aorta.  IMPRESSION: Right central line is malpositioned directed laterally with the tip overlying the right axilla. This needs to be repositioned. No gross pneumothorax. Patient's chin overlies the lung apices limiting evaluation for detection of tiny pneumothorax.  Asymmetric airspace disease greater on right minimally changed since prior exam. This may represent asymmetric pulmonary edema versus infectious  infiltrate. Clinical correlation recommended.  These results were called by telephone at the time of interpretation on 12/12/2013 at 5:18 PM to Amy, patient's nurse, who verbally acknowledged these results.   Electronically Signed   By: Bridgett Larsson M.D.   On: 12/12/2013 17:21   Dg Chest Port 1 View  12/12/2013   CLINICAL DATA:  Fever and shortness of breath  EXAM: PORTABLE CHEST - 1 VIEW  COMPARISON:  DG CHEST 2V dated 08/24/2012; DG CHEST 1V PORT dated 08/11/2012; CT CHEST W/O CM dated 11/20/2011; DG CHEST 2 VIEW dated 04/07/2012  FINDINGS: The examination is degraded due to portable technique and kyphotic projection.  Grossly unchanged enlarged cardiac silhouette and mediastinal contours. Worsening coarse interstitial opacities within the right mid lung. Grossly unchanged bilateral perihilar and left basilar opacities, likely atelectasis or scar. No definite evidence of edema. No definite pneumothorax, though note, evaluation degraded secondary to patient's overlying chin. Grossly unchanged bones.  IMPRESSION: Degraded examination and demonstrates development of interstitial opacities within the right mid lung with differential considerations including early asymmetric pulmonary edema and (favored) developing atypical infection/aspiration.   Electronically Signed   By: Simonne Come M.D.   On: 12/12/2013 10:31    Scheduled Meds: . aspirin  81 mg Oral Daily  . atropine  0.5 mg Intravenous Once  . divalproex  125 mg Oral BID  . docusate sodium  100 mg Oral BID  . donepezil  5 mg Oral QHS  . dutasteride  0.5 mg Oral Daily  . enoxaparin (LOVENOX) injection  40 mg Subcutaneous QHS  . fluticasone  1 puff Inhalation BID  . ipratropium-albuterol  3 mL Nebulization QID  . mirtazapine  7.5 mg Oral QHS  . pantoprazole  40 mg Oral Daily  . piperacillin-tazobactam (ZOSYN)  IV  3.375 g Intravenous Q8H  . polyethylene glycol  17 g Oral Daily  . senna  2 tablet Oral QHS  . sertraline  50 mg Oral Daily  . tamsulosin   0.4 mg Oral Daily  . temazepam  30 mg Oral QHS  . vancomycin  500 mg Intravenous Q12H   Continuous Infusions: . DOPamine 8 mcg/kg/min (12/14/13 0800)    Active Problems:   HYPERTENSION, BENIGN   Acute respiratory failure   COPD (chronic obstructive pulmonary disease)   Chronic diastolic CHF (congestive heart failure)   Aspiration pneumonia   Nausea and vomiting   Bradycardia- transient, asymptomatic   HCAP (healthcare-associated pneumonia)   Septic shock(785.52)- on Dopamine   RBBB   1St degree AV block    Time spent: 30 min    Martie Fulgham  [facility] Pager [phone]. If 7PM-7AM, please contact night-coverage at [email or web address], password [facility] 12/14/2013, 9:02 AM  LOS: 2 days

## 2013-12-14 NOTE — Progress Notes (Signed)
Speech Language Pathology Treatment: Dysphagia  Patient Details Name: [name] M Portner MRN: [id number] DOB: 1922/10/09 Today's Date: 12/14/2013 Time: [id number] SLP Time Calculation (min): 25 min  Assessment / Plan / Recommendation Clinical Impression  Pt accepting minimal amounts of intake only today - stating he is "not hungry".  Full lunch tray at bedside of which pt agreed to consume icecream and milk.  Session facilitated by SLP providing verbal cues to follow compensation strategies to mitigate known multifactorial dysphagia. No symptoms of airway compromise noted, minimal delayed swallow response.  Pt consumed only 2 ounces of ice-cream and 3 straw sips of milk.  SLP offered to have meats ground for pt, which he agreed for ease of swallowing.   Will follow up once more to assure pt using compensation strategies found helpful to mitigate dysphagia/asp risk given esophageal issues. Wrote down precautions on paper and presented to pt again *paper from yesterday not located in room.   Note results of CXR.     HPI HPI: 80 yo male adm from [facility] with N/V fevers.  Pt found to have right infiltrate on CXR + hypotension.  PMH + for heart failure, mild dementia, COPD, wheelchair bound, previous smoker stopped in 1955, dysphagia dating back to at least 2012.  Bedside swallow evaluation completed yesterday with recommendations for dys3/thin diet with precautions given h/o dysphagia.  Today pt seen for dietary tolerance.    Pertinent Vitals Afebrile, decreased, intake listed as 15%  SLP Plan  Continue with current plan of care    Recommendations Diet recommendations: Dysphagia 3 (mechanical soft) (ground meat, extra gravy/sauce) Medication Administration: Whole meds with liquid Supervision: Patient able to self feed;Staff to assist with self feeding (assist as needed) Compensations: Slow rate;Small sips/bites;Follow solids with liquid (intermittent dry swallow ) Postural Changes and/or Swallow  Maneuvers: Out of bed for meals;Seated upright 90 degrees;Upright 30-60 min after meal              Oral Care Recommendations: Oral care BID Plan: Continue with current plan of care    GO     Donavan Burnetamara Dmauri Rosenow, MS [facility] SLP [phone]

## 2013-12-14 NOTE — Progress Notes (Signed)
[facility] aware of critical hemoglobin.  Orders receved.

## 2013-12-14 NOTE — Progress Notes (Addendum)
Clinical Social Work Department BRIEF PSYCHOSOCIAL ASSESSMENT 12/15/2013  Patient:  [name], [name]     Account Number:  [id number]     Admit date:  12/12/2013  Clinical Social Worker:  Ulyess Blossom  Date/Time:  12/14/2013 02:00 PM  Referred by:  Physician  Date Referred:  12/14/2013 Referred for  ALF Placement   Other Referral:   Interview type:  Patient Other interview type:    PSYCHOSOCIAL DATA Living Status:  FACILITY Admitted from facility:  [facility] Level of care:  Assisted Living Primary support name:  Peziah,Suzanne/daughter Primary support relationship to patient:  CHILD, ADULT Degree of support available:   inadequate, pt reports that pt children are not involved and pt does not want CSW to contact pt children    CURRENT CONCERNS Current Concerns  Post-Acute Placement   Other Concerns:    SOCIAL WORK ASSESSMENT / PLAN CSW received referral that pt admitted from [facility].    CSW visited pt room and pt had lawyer present at bedside. CSW allowed time for pt to meet with lawyer and lawyer engaged this CSW upon exiting the room. Pt lawyer inquired how pt law office could obtain medical records as pt signed medical release and CSW discussed that pt law office would have to obtain medical record through [facility] record department. Per lawyer, pt lawyer would like to be updated on pt progression in the hospital and anticipation of when pt will be ready for discharge. CSW discussed that CSW can provide this information only if pt agrees for me to update pt lawyer. Pt lawyer provided this CSW with their contact information.      CSW met with pt at bedside. CSW introduced self and explained role. Pt confirmed that he is a resident at [facility]. Pt inquired with CSW when he would be able to leave hospital and CSW discussed that it will likely still be a few days as pt is not yet medically stable. Pt reports that he is wheelchair bound and requires  assistance with transfer to wheelchair. Pt discussed that he has been satisfied with the care at [facility] and hopes to return there. CSW discussed with pt that PT plans to evaluate pt in order to make sure that pt is at baseline with transfers in order to provide information to [facility] to ensure facility can continue to meet pt needs. CSW discussed that PT may recommend [facility], but pt states that he does not feel that wold be necessary as he is wheelchair bound and can get PT at [facility] if needed.    CSW discussed with pt about pt lawyer requesting updates about when pt will be medically ready for discharge. Pt agreeable to CSW updating pt lawyer when appropriate and CSW will continue to follow up with pt to ensure he is agreeable to CSW contacting pt lawyer when needed.    CSW contacted [facility] and spoke with resident care coordinator, Carla Drape. Per [facility], pt has complex family dynamics and pt has disengaged from his family and consulted a law firm in order to make arrangements for lawyer to make decisions if pt unable to make his own decisions. [facility] would like for CSW to send updates when pt nearing being medically ready for discharge in order to ensure that facility can continue to meet pt needs. [facility] is hopeful that pt may be agreeable to Palliative Medicine consult to establish goals of care and  see if pt may be agreeable to having palliative services follow at ALF for pain management.    CSW will discuss with MD and have MD determine if PMT GOC is appropriate.    CSW will send clinicals to [facility] as pt stabilizes and is able to participate in PT.    CSW will continue to follow up with pt regarding disposition planning.   Assessment/plan status:  Psychosocial Support/Ongoing Assessment of Needs Other assessment/ plan:   discharge planning   Information/referral to community resources:   Referral back to [facility], will  discuss with MD about potential consult to PMT for [place] as ALF is hopeful that this may be addressed in hospital    PATIENT'S/FAMILY'S RESPONSE TO PLAN OF CARE: Pt alert and oriented x 4. Pt engaged in conversation, but answers questions short and directly. Pt hopeful to return to [facility] as he is satisfied with the care there and he feels they would be able to continue to manage his care. Pt did not elaborate on situation with pt children, but has been adamant about no-one in the medical team to contact pt family members.     Alison Murray, MSW, [place] Work [phone]

## 2013-12-14 NOTE — Progress Notes (Signed)
PT Cancellation Note  Patient Details Name: [name] MRN: [id number] DOB: 05-31-1922   Cancelled Treatment:    Reason Eval/Treat Not Completed: Patient not medically ready--still on dopamine per RN. Will check back another day. Thanks.   Rebeca AlertJannie Kirsten Spearing, MPT Pager: [phone]

## 2013-12-14 NOTE — Progress Notes (Signed)
eLink Physician-Brief Progress Note Patient Name: [name] DOB: October 23, 1921 MRN: [id number]  Date of Service  12/14/2013   HPI/Events of Note  Hgb down to 6.9 from 7.0 with drift down from 9,1 on 3/1   eICU Interventions  Plan: Transfuse 1 u pRBC Post-transfusion CBC   Intervention Category Intermediate Interventions: Bleeding - evaluation and treatment with blood products  Caesar Mannella 12/14/2013, 5:52 AM

## 2013-12-14 NOTE — Progress Notes (Signed)
INITIAL NUTRITION ASSESSMENT  DOCUMENTATION CODES Per approved criteria  -Not Applicable   INTERVENTION: - Ensure Complete TID - Encouraged increased meal intake - Will continue to monitor   NUTRITION DIAGNOSIS: Inadequate oral intake related to poor appetite as evidenced by <25% meal intake.   Goal: Pt to consume >90% of meals/supplements  Monitor:  Weights, labs, intake  Reason for Assessment: Consult for assessment   80 y.o. male  Admitting Dx: Nausea, vomiting, fever  ASSESSMENT: Pt with history of paroxysmal atrial fibrillation, chronic diastolic heart failure, COPD, HTN, chronic back pain currently wheelchair bound, esophageal diverticulum and dysphagia, and mild dementia who presents with nausea, vomiting, and fever. He presents from [facility].  The patient was last at his baseline health 12/11/13. He states he was up overnight with nausea and heaves but denies vomiting. On 12/12/13 he developed fever and he was transported by EMS to the ER. He states he has some nonproductive cough and mild SOB, but otherwise feels okay. Last BM was 2-3 days ago and was normal.   Met with pt who reports not liking the food here and eating better at [facility]. Kept falling asleep during conversation. Reports he has been losing weight, unsure how much. Past weight trend shows pt's weight was 159 pounds last month, 136 pounds currently, requested RN re-check weight. Requested chocolate Ensure, which was provided. Did not appear visibly malnourished in upper body. Had SLP evaluation yesterday, pt noted to be at mild aspiration risk. PO intake this morning was 15% of breakfast.    Height: Ht Readings from Last 1 Encounters:  12/12/13 '5\' 9"'  (1.753 m)    Weight: Wt Readings from Last 1 Encounters:  12/14/13 136 lb 0.4 oz (61.7 kg)    Ideal Body Weight: 160 lb  % Ideal Body Weight: 85%  Wt Readings from Last 10 Encounters:  12/14/13 136 lb 0.4 oz (61.7 kg)  12/08/12 159 lb (72.122  kg)  09/11/12 150 lb (68.04 kg)  09/03/12 153 lb 6.4 oz (69.582 kg)  08/22/12 159 lb 6.3 oz (72.3 kg)  08/12/12 139 lb 12.4 oz (63.4 kg)  05/08/12 140 lb (63.504 kg)  04/10/12 152 lb 5.4 oz (69.1 kg)  03/26/11 153 lb (69.4 kg)  06/15/10 162 lb (73.483 kg)    Usual Body Weight: 159 lb   % Usual Body Weight: 85%  BMI:  Body mass index is 20.08 kg/(m^2).  Estimated Nutritional Needs: Kcal: 1850-2050 Protein: 90-110g Fluid: 1.8-2L/day  Skin: Stage II sacral pressure ulcer   Diet Order: Dysphagia 3, thin  EDUCATION NEEDS: -No education needs identified at this time   Intake/Output Summary (Last 24 hours) at 12/14/13 1016 Last data filed at 12/14/13 1000  Gross per 24 hour  Intake 3265.86 ml  Output   2525 ml  Net 740.86 ml    Last BM: 3/1  Labs:   Recent Labs Lab 12/12/13 1000 12/13/13 0400 12/14/13 0425  NA 140 143 145  K 4.3 3.2* 3.8  CL 98 107 110  CO2 '27 24 26  ' BUN 30* 22 13  CREATININE 0.69 0.69 0.63  CALCIUM 9.3 8.0* 8.2*  MG  --  1.7 2.1  PHOS  --   --  2.6  GLUCOSE 100* 103* 103*    CBG (last 3)  No results found for this basename: GLUCAP,  in the last 72 hours  Scheduled Meds: . aspirin  81 mg Oral Daily  . atropine  0.5 mg Intravenous Once  . divalproex  125  mg Oral BID  . docusate sodium  100 mg Oral BID  . donepezil  5 mg Oral QHS  . dutasteride  0.5 mg Oral Daily  . enoxaparin (LOVENOX) injection  40 mg Subcutaneous QHS  . feeding supplement (ENSURE COMPLETE)  237 mL Oral BID BM  . fluticasone  1 puff Inhalation BID  . ipratropium-albuterol  3 mL Nebulization QID  . mirtazapine  7.5 mg Oral QHS  . pantoprazole  40 mg Oral Daily  . piperacillin-tazobactam (ZOSYN)  IV  3.375 g Intravenous Q8H  . polyethylene glycol  17 g Oral Daily  . senna  2 tablet Oral QHS  . sertraline  50 mg Oral Daily  . tamsulosin  0.4 mg Oral Daily  . temazepam  30 mg Oral QHS  . vancomycin  500 mg Intravenous Q12H    Continuous Infusions: . DOPamine  8 mcg/kg/min (12/14/13 1000)    Past Medical History  Diagnosis Date  . Atrial fibrillation with slow ventricular response     Symptoms of weakness  . Paroxysmal atrial fibrillation     Hx of, currently on amiodarone load  . Syncope     Probably related to sinus pause, finally conversion from atrial fibrillation to sinus rhythm  . COPD (chronic obstructive pulmonary disease)   . Hypertension   . Abnormal CXR 07/06/2007; 06/15/2010    Eleveted R HD 07/06/2007 > no change 09/92/2011  . Chronic back pain   . Esophageal diverticulum 08/08/2012    Large R sided per esophagram    Past Surgical History  Procedure Laterality Date  . Joint replacement      right knee  . Appendectomy      Mikey College MS, RD, LDN [phone] Pager [phone] After Hours Pager

## 2013-12-14 NOTE — Progress Notes (Signed)
       Patient Name: [name] Date of Encounter: 12/14/2013    SUBJECTIVE: No CV problems over night.   TELEMETRY:  No significant bradycardia episodes: Filed Vitals:   12/14/13 0200 12/14/13 0400 12/14/13 0500 12/14/13 0600  BP: 82/52  114/63 126/62  Pulse: 73  87 87  Temp: 99.1 F (37.3 C) 98.1 F (36.7 C) 98.8 F (37.1 C) 98.8 F (37.1 C)  TempSrc: Core (Comment) Core (Comment)    Resp: 15  18 22   Height:      Weight:  136 lb 0.4 oz (61.7 kg)    SpO2: 100%  100% 97%    Intake/Output Summary (Last 24 hours) at 12/14/13 0708 Last data filed at 12/14/13 0600  Gross per 24 hour  Intake 3335.26 ml  Output   2225 ml  Net 1110.26 ml    LABS: Basic Metabolic Panel:  Recent Labs  57/84/6901/11/28 0400 12/14/13 0425  NA 143 145  K 3.2* 3.8  CL 107 110  CO2 24 26  GLUCOSE 103* 103*  BUN 22 13  CREATININE 0.69 0.63  CALCIUM 8.0* 8.2*  MG 1.7 2.1  PHOS  --  2.6   CBC:  Recent Labs  12/12/13 1000  12/13/13 1900 12/14/13 0425  WBC 9.6  < > 5.2 5.0  NEUTROABS 7.9*  --   --   --   HGB 9.1*  < > 7.0* 6.9*  HCT 26.5*  < > 20.5* 20.3*  MCV 116.2*  < > 117.1* 118.7*  PLT 542*  < > 423* 408*  < > = values in this interval not displayed. Cardiac Enzymes:  Recent Labs  12/12/13 1609 12/12/13 2139 12/13/13 0339  TROPONINI <0.30 <0.30 <0.30   BNP: No components found with this basename: POCBNP,  Hemoglobin A1C: No results found for this basename: HGBA1C,  in the last 72 hours Fasting Lipid Panel: No results found for this basename: CHOL, HDL, LDLCALC, TRIG, CHOLHDL, LDLDIRECT,  in the last 72 hours  Radiology/Studies:  No new  Physical Exam: Blood pressure 126/62, pulse 87, temperature 98.8 F (37.1 C), temperature source Core (Comment), resp. rate 22, height 5\' 9"  (1.753 m), weight 136 lb 0.4 oz (61.7 kg), SpO2 97.00%. Weight change: -17 lb 2.1 oz (-7.772 kg)   No significant abn or rhythm or auscultatory abnormality  ASSESSMENT:  Asymptomatic  bradycardia.  Plan:  Call us if we can help.  Windy FastSigned, Haidy Kackley III,Ellise Kovack W 12/14/2013, 7:08 AM

## 2013-12-14 NOTE — Progress Notes (Signed)
Name: [name] MRN: [id number] DOB: [birth year]    ADMISSION DATE:  12/12/2013 CONSULTATION DATE:  12/14/2013  REFERRING MD :  Malachi Bonds PRIMARY SERVICE: [facility]  CHIEF COMPLAINT:  hypotension  BRIEF PATIENT DESCRIPTION: 80 y.o , resident of assisted living presents with fever, nausea vomiting, RT infiltrate on CXR & hypotension not responding to fluids, lactate 2.0. PMH -chronic back pain currently wheelchair bound, esophageal diverticulum and dysphagia, mild dementia , PAF, chronic diastolic heart failure, h/o bradycardic arrest 2013   SIGNIFICANT EVENTS / STUDIES:    LINES / TUBES:   CULTURES: bld 31/ >> Urine 3/1 >>neg 3/1 flu panel>>neg  ANTIBIOTICS: Zosyn 3/1 >> vanc 3/1 >> 3/1 tamiflu>>off   HISTORY OF PRESENT ILLNESS:   81 y.o , resident of assisted living presents with fever, nausea vomiting, RT infiltrate on CXR & hypotension not responding to fluids, lactate 2.0. He reports vomiting x 1 day, similar episopde 2 wks ago, no abd pain or nausea. 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 780 240   I.V. (mL/kg) 1764 (28.6) 166.1 (2.7)   IV Piggyback 800    Total Intake(mL/kg) 3344 (54.2) 406.1 (6.6)   Urine (mL/kg/hr) 2225 (1.5) 300 (1.3)   Total Output 2225 300    Net +1119 +106.1          PHYSICAL EXAMINATION: Gen. Pleasant, well-nourished, in no distress, normal affect. HOH. kyphotic Neck: No JVD, LAN Lungs: no use of accessory muscles, , clear without rales or rhonchi . Diminished in bases. Very kyphotic  Cardiovascular: Rhythm irregular, no murmur, on dopa Abdomen: soft and non-tender, no hepatosplenomegaly, BS normal. Musculoskeletal: No deformities, no cyanosis or clubbing Neuro:  alert, non focal Skin:  Warm, no lesions/ rash   LABS:  CBC  Recent Labs Lab 12/13/13 0400 12/13/13 1900 12/14/13 0425  WBC 7.2 5.2 5.0  HGB 7.1* 7.0* 6.9*  HCT 21.2* 20.5* 20.3*  PLT 461* 423* 408*   Coag's No results found for this basename: APTT, INR,  in the last 168 hours BMET  Recent Labs Lab 12/12/13 1000 12/13/13 0400 12/14/13 0425  NA 140 143 145  K 4.3 3.2* 3.8  CL 98 107 110  CO2 27 24 26   BUN 30* 22 13  CREATININE 0.69 0.69 0.63  GLUCOSE 100* 103* 103*   Electrolytes  Recent Labs Lab 12/12/13 1000 12/13/13 0400 12/14/13 0425  CALCIUM 9.3 8.0* 8.2*  MG  --  1.7 2.1  PHOS  --   --  2.6   Sepsis Markers  Recent Labs Lab 12/12/13 1037 12/12/13 1609 12/13/13 0400 12/13/13 0430 12/14/13 0425  LATICACIDVEN 2.07 1.1 0.8  --   --  PROCALCITON  --   --   --  0.87 0.42   ABG No results found for this basename: PHART, PCO2ART, PO2ART,  in the last 168 hours Liver Enzymes  Recent Labs Lab 12/12/13 1000  AST 18  ALT 16  ALKPHOS 92  BILITOT 0.9  ALBUMIN 3.5   Cardiac Enzymes  Recent Labs Lab 12/12/13 1609 12/12/13 2139 12/13/13 0339  TROPONINI <0.30 <0.30 <0.30   Glucose No results found for this basename: GLUCAP,  in the last 168 hours  Imaging Dg Chest Port 1 View  12/14/2013   CLINICAL DATA:  Pneumonia.  Central line.  EXAM: PORTABLE CHEST - 1 VIEW  COMPARISON:  12/13/2013  FINDINGS: Right jugular Central venous catheter extends into the right axillary vein and is unchanged. No pneumothorax   Hypoventilation with decreased lung volume and bibasilar atelectasis.  Diffuse bilateral airspace disease may represent pneumonia. This also is unchanged.  Left lower lobe consolidation is slightly more prominent may be due to infiltrate and effusion.  IMPRESSION: Right jugular catheter tip extends into the right axillary vein unchanged  Diffuse bilateral airspace disease may represent ARDS or pneumonia.  Increase in left lower lobe opacity.   Electronically Signed   By: Marlan Palauharles  Clark M.D.   On: 12/14/2013 07:22   Dg Chest Port 1 View  12/13/2013   CLINICAL DATA:  Pneumonia.  EXAM: PORTABLE CHEST - 1 VIEW  COMPARISON:  Chest x-ray from yesterday  FINDINGS: Unchanged patchy bilateral airspace disease, most notable in the peripheral right chest. Unchanged malpositioning of the right IJ catheter, directed towards the right axilla.  Hypoaeration. No effusion or pneumothorax visible. Unchanged cardiomegaly.  IMPRESSION: 1. Persistent malpositioning of the right IJ catheter, tip directed to the axilla. 2. Unchanged patchy airspace disease compatible with history of pneumonia.   Electronically Signed   By: Tiburcio PeaJonathan  Watts M.D.   On: 12/13/2013 06:14   Dg Chest Port 1 View  12/12/2013   CLINICAL DATA:  Central line placement.  EXAM: PORTABLE CHEST - 1 VIEW  COMPARISON:  12/12/2013 10:17 a.m.  FINDINGS: Right central line is malpositioned directed laterally with the tip overlying the right axilla. This needs to be repositioned. No gross pneumothorax. Patient's chin overlies the lung apices limiting evaluation for detection of tiny pneumothorax.  Asymmetric airspace disease greater on right minimally changed since prior exam. This may represent asymmetric pulmonary edema versus infectious infiltrate. Clinical correlation recommended.  Mild cardiomegaly.  Calcified mildly tortuous aorta.  IMPRESSION: Right central line is malpositioned directed laterally with the tip overlying the right axilla. This needs to be  repositioned. No gross pneumothorax. Patient's chin overlies the lung apices limiting evaluation for detection of tiny pneumothorax.  Asymmetric airspace disease greater on right minimally changed since prior exam. This may represent asymmetric pulmonary edema versus infectious infiltrate. Clinical correlation recommended.  These results were called by telephone at the time of interpretation on 12/12/2013 at 5:18 PM to Amy, patient's nurse, who verbally acknowledged these results.   Electronically Signed   By: Bridgett LarssonSteve  Olson M.D.   On: 12/12/2013 17:21        ASSESSMENT / PLAN:  PULMONARY A:Rt HCAP, slowly improving P:   O2 [place] abx as per ID section    CARDIOVASCULAR A: Chronic diastolic heart failure Atrial fibn on amiodarone and bradycardia, now sb/sr EKG- RBBB with evolved IWMI (chronic) Septic shock P:  Wean dopamine as long as not symptomatic  See cards note > he would not want a pacer; should be  OK to wean dopa to off  RENAL  Intake/Output Summary (Last 24 hours) at 12/14/13 1051 Last data filed at 12/14/13 1000  Gross per 24 hour  Intake 3265.86 ml  Output   2525 ml  Net 740.86 ml    A:  Oliguria (resolved with foley) P:   foley  GASTROINTESTINAL A:  N/V P:   Follow,   HEMATOLOGIC A:  No issues P:  Follow CBC  INFECTIOUS A:  Rt HCAP / aspiration P:   Zosyn/ vanc empiric  ENDOCRINE A:  No issues   P:     NEUROLOGIC A:  Mild dementia P:   Appears lucid & able to make decisions  TODAY'S SUMMARY:  Wean dopa to off. Little for PCCM to add, we will be available PRN.   Brett Canales Minor ACNP [facility] Pager [phone] till 3 pm If no answer page [phone] 12/14/2013, 10:47 AM  Levy Pupa, MD, PhD 12/14/2013, 12:30 PM [facility] [phone] or if no answer [phone]

## 2013-12-14 NOTE — Progress Notes (Signed)
Patient refuses to receive blood despite education.

## 2013-12-15 ENCOUNTER — Inpatient Hospital Stay (HOSPITAL_COMMUNITY): Payer: Medicare Other

## 2013-12-15 LAB — CBC
HCT: 19.7 % — ABNORMAL LOW (ref 39.0–52.0)
HCT: 26.5 % — ABNORMAL LOW (ref 39.0–52.0)
HCT: 24.8 % — ABNORMAL LOW (ref 39.0–52.0)
HEMOGLOBIN: 9 g/dL — AB (ref 13.0–17.0)
Hemoglobin: 6.5 g/dL — CL (ref 13.0–17.0)
Hemoglobin: 8.5 g/dL — ABNORMAL LOW (ref 13.0–17.0)
MCH: 39.4 pg — ABNORMAL HIGH (ref 26.0–34.0)
MCH: 36.3 pg — AB (ref 26.0–34.0)
MCH: 36 pg — AB (ref 26.0–34.0)
MCHC: 33 g/dL (ref 30.0–36.0)
MCHC: 34 g/dL (ref 30.0–36.0)
MCHC: 34.3 g/dL (ref 30.0–36.0)
MCV: 119.4 fL — ABNORMAL HIGH (ref 78.0–100.0)
MCV: 106.9 fL — ABNORMAL HIGH (ref 78.0–100.0)
MCV: 105.1 fL — ABNORMAL HIGH (ref 78.0–100.0)
PLATELETS: 364 10*3/uL (ref 150–400)
Platelets: 391 10*3/uL (ref 150–400)
Platelets: 371 10*3/uL (ref 150–400)
RBC: 1.65 MIL/uL — ABNORMAL LOW (ref 4.22–5.81)
RBC: 2.48 MIL/uL — ABNORMAL LOW (ref 4.22–5.81)
RBC: 2.36 MIL/uL — AB (ref 4.22–5.81)
RDW: 16.3 % — ABNORMAL HIGH (ref 11.5–15.5)
WBC: 5.3 10*3/uL (ref 4.0–10.5)
WBC: 5.9 10*3/uL (ref 4.0–10.5)
WBC: 4.4 10*3/uL (ref 4.0–10.5)

## 2013-12-15 LAB — BASIC METABOLIC PANEL
BUN: 10 mg/dL (ref 6–23)
CO2: 28 mEq/L (ref 19–32)
CREATININE: 0.53 mg/dL (ref 0.50–1.35)
Calcium: 8.1 mg/dL — ABNORMAL LOW (ref 8.4–10.5)
Chloride: 109 mEq/L (ref 96–112)
Glucose, Bld: 92 mg/dL (ref 70–99)
Potassium: 4.1 mEq/L (ref 3.7–5.3)
Sodium: 143 mEq/L (ref 137–147)

## 2013-12-15 LAB — MAGNESIUM: MAGNESIUM: 1.9 mg/dL (ref 1.5–2.5)

## 2013-12-15 LAB — PROCALCITONIN: PROCALCITONIN: 0.24 ng/mL

## 2013-12-15 LAB — T4, FREE: Free T4: 0.9 ng/dL (ref 0.80–1.80)

## 2013-12-15 LAB — PREPARE RBC (CROSSMATCH)

## 2013-12-15 LAB — VANCOMYCIN, TROUGH: VANCOMYCIN TR: 10.6 ug/mL (ref 10.0–20.0)

## 2013-12-15 LAB — T3, FREE: T3, Free: 1.4 pg/mL — ABNORMAL LOW (ref 2.3–4.2)

## 2013-12-15 MED ORDER — ACETAMINOPHEN 325 MG PO TABS
650.0000 mg | ORAL_TABLET | Freq: Four times a day (QID) | ORAL | Status: DC | PRN
  Administered 2013-12-15 – 2013-12-16 (×2): 650 mg via ORAL
  Filled 2013-12-15 (×2): qty 2

## 2013-12-15 MED ORDER — HYDROCORTISONE NA SUCCINATE PF 100 MG IJ SOLR
50.0000 mg | Freq: Four times a day (QID) | INTRAMUSCULAR | Status: DC
  Administered 2013-12-15 – 2013-12-17 (×7): 50 mg via INTRAVENOUS
  Filled 2013-12-15 (×2): qty 1
  Filled 2013-12-15 (×2): qty 2
  Filled 2013-12-15 (×3): qty 1
  Filled 2013-12-15: qty 2
  Filled 2013-12-15 (×2): qty 1
  Filled 2013-12-15: qty 2
  Filled 2013-12-15: qty 1

## 2013-12-15 MED ORDER — SODIUM CHLORIDE 0.9 % IV BOLUS (SEPSIS)
500.0000 mL | Freq: Once | INTRAVENOUS | Status: AC
  Administered 2013-12-15: 500 mL via INTRAVENOUS

## 2013-12-15 MED ORDER — VANCOMYCIN HCL IN DEXTROSE 750-5 MG/150ML-% IV SOLN
750.0000 mg | Freq: Two times a day (BID) | INTRAVENOUS | Status: DC
  Administered 2013-12-15 – 2013-12-17 (×4): 750 mg via INTRAVENOUS
  Filled 2013-12-15 (×6): qty 150

## 2013-12-15 NOTE — Consult Note (Signed)
WOC wound consult note Reason for Consult:Stage II pressure ulcer on sacrum, Stage I pressure ulcer on right heel Wound type:Pressure Pressure Ulcer POA: Yes Measurement:sacral open area, Stage II measuring 0.4cm x 0.2cm.  Also small area on right heel measuring 2cm round, red, Stage I non-blanchable erythema. Wound ZOX:WRUEAbed:clean, pink, granulating Drainage (amount, consistency, odor) None Periwound:intact, clear, dry Dressing procedure/placement/frequency:Soft silicone dressing to sacrum twice weekly along with turning and repositioning side to side while in bed.  A pressure redistribution cushion is provided for the chair.  Prevalon boots for pressure redistribution to heels while in bed. WOC nursing team will not follow, but will remain available to this patient, the nursing and medical team.  Please re-consult if needed. Thanks, Ladona MowLaurie Makeyla Govan, MSN, RN, GNP, KianaWOCN, CWON-AP [phone])

## 2013-12-15 NOTE — Progress Notes (Signed)
PT Cancellation Note  Patient Details Name: Meredeth IdeClyde M Dusenbury MRN: [id number] DOB: [birth year]   Cancelled Treatment:    Reason Eval/Treat Not Completed: PT screened, no needs identified, will sign off (total assist at baseline for mobility)   [facility]Arynn Armand 12/15/2013, 12:56 PM

## 2013-12-15 NOTE — Progress Notes (Signed)
Name: Evan IdeClyde M Pennington MRN: [id number] DOB: [birth year]    ADMISSION DATE:  12/12/2013 CONSULTATION DATE:  12/15/2013  REFERRING MD :  Malachi BondsShort PRIMARY SERVICE: [facility]  CHIEF COMPLAINT:  hypotension  BRIEF PATIENT DESCRIPTION: 80 y.o , resident of assisted living presents with fever, nausea vomiting, RT infiltrate on CXR & hypotension not responding to fluids, lactate 2.0. PMH -chronic back pain currently wheelchair bound, esophageal diverticulum and dysphagia, mild dementia , PAF, chronic diastolic heart failure, h/o bradycardic arrest 2013   SIGNIFICANT EVENTS / STUDIES:  3/4 solucortef started  LINES / TUBES:   CULTURES: bld 31/ >> Urine 3/1 >>neg 3/1 flu panel>>neg  ANTIBIOTICS: Zosyn 3/1 >> vanc 3/1 >> 3/1 tamiflu>>off   HISTORY OF PRESENT ILLNESS:   80 y.o , resident of assisted living presents with fever, nausea vomiting, RT infiltrate on CXR & hypotension not responding to fluids, lactate 2.0. He reports vomiting x 1 day, similar episopde 2 wks ago, no abd pain or nausea. 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 840    I.V. (mL/kg) 518.2 (8.1)    Blood 12 335   IV Piggyback 305    Total Intake(mL/kg) 1675.2 (26.1) 335 (5.2)   Urine (mL/kg/hr) 1210 (0.8) 420 (1.5)   Total Output 1210 420   Net +465.2 -85          PHYSICAL EXAMINATION: Gen. Pleasant, well-nourished, in no distress, normal affect. HOH. kyphotic Neck: No JVD, LAN Lungs: no use of accessory muscles, , clear without rales or rhonchi . Diminished in bases. Very kyphotic  Cardiovascular: Rhythm irregular, no murmur, on dopa Abdomen: soft and non-tender, no hepatosplenomegaly, BS normal. Musculoskeletal: No deformities, no cyanosis or clubbing Neuro:  alert, non focal, speech clear Skin:  Warm, no lesions/ rash   LABS:  CBC  Recent Labs Lab 12/13/13 1900 12/14/13 0425 12/15/13 0315  WBC 5.2 5.0 5.3  HGB 7.0* 6.9* 6.5*  HCT 20.5* 20.3* 19.7*  PLT 423* 408* 391   Coag's No results found for this basename: APTT, INR,  in the last 168 hours BMET  Recent Labs Lab 12/13/13 0400 12/14/13 0425 12/15/13 0315  NA 143 145 143  K 3.2* 3.8 4.1  CL 107 110 109  CO2 24 26 28   BUN 22 13 10   CREATININE 0.69 0.63 0.53  GLUCOSE 103* 103* 92   Electrolytes  Recent Labs Lab 12/13/13 0400 12/14/13 0425 12/15/13 0315  CALCIUM 8.0* 8.2* 8.1*  MG 1.7 2.1 1.9  PHOS  --  2.6  --    Sepsis Markers  Recent Labs Lab 12/12/13 1037 12/12/13 1609 12/13/13 0400 12/13/13 0430 12/14/13 0425 12/15/13 0315  LATICACIDVEN 2.07 1.1 0.8  --   --   --  PROCALCITON  --   --   --  0.87 0.42 0.24   ABG No results found for this basename: PHART, PCO2ART, PO2ART,  in the last 168 hours Liver Enzymes  Recent Labs Lab 12/12/13 1000  AST 18  ALT 16  ALKPHOS 92  BILITOT 0.9  ALBUMIN 3.5   Cardiac Enzymes  Recent Labs Lab 12/12/13 1609 12/12/13 2139 12/13/13 0339  TROPONINI <0.30 <0.30 <0.30   Glucose No results found for this basename: GLUCAP,  in the last 168 hours  Imaging Dg Chest Port 1 View  12/15/2013   CLINICAL DATA:  Shortness breath.  EXAM: PORTABLE CHEST - 1 VIEW  COMPARISON:  Chest x-ray 12/14/2013.  FINDINGS: Right internal jugular single-lumen porta cath with tip laterally directed  into the right axillary vein. Patchy multifocal interstitial and airspace disease is again noted throughout the lungs bilaterally, most confluent in the left base and throughout the entire right lung. Overall, aeration has slightly improved in the left base compared to yesterday's examination. Decreasing small left pleural effusion. No evidence of pulmonary edema. Heart size is upper limits of normal. Mediastinal contours are within normal limits. Atherosclerosis in the thoracic aorta. Markedly low lung volumes.  IMPRESSION: 1. Malpositioned right-sided porta cath again noted, with tip directed in the right axillary vein. 2. Low lung volumes with persistent multifocal interstitial and airspace disease in the lungs bilaterally compatible with multilobar pneumonia. Aeration has slightly improved in the left lower lobe. Decreasing small left pleural effusion. 3. Atherosclerosis.   Electronically Signed   By: Trudie Reed M.D.   On: 12/15/2013 09:24   Dg Chest Port 1 View  12/14/2013   CLINICAL DATA:  Pneumonia.  Central line.  EXAM: PORTABLE CHEST - 1 VIEW  COMPARISON:  12/13/2013  FINDINGS: Right jugular Central venous catheter extends into the right axillary vein and is unchanged. No pneumothorax  Hypoventilation with decreased lung volume and bibasilar atelectasis.  Diffuse bilateral airspace disease may represent pneumonia. This also is unchanged.  Left lower lobe consolidation is slightly more prominent may be due to infiltrate and effusion.  IMPRESSION: Right jugular catheter tip extends into the right axillary vein unchanged  Diffuse bilateral airspace disease may represent ARDS or pneumonia.  Increase in left lower lobe opacity.   Electronically Signed   By: Marlan Palau M.D.   On: 12/14/2013 07:22        ASSESSMENT / PLAN:  PULMONARY A:Rt HCAP, slowly improving P:   O2 [place] abx as per ID section    CARDIOVASCULAR A: Chronic diastolic heart failure Atrial fibn off amiodarone and  bradycardia, now sb/sr EKG- RBBB with evolved IWMI (chronic) Septic shock P:  Wean dopamine as long as not symptomatic (3/4 hr 80 bp 74/50) See cards note > he would take a pacer if needed Saline bolus 3/4 3/4 stress steroids  RENAL  Intake/Output Summary (Last 24 hours) at 12/15/13 1120 Last data filed at 12/15/13 1000  Gross per 24 hour  Intake 1555.4 ml  Output   1330 ml  Net  225.4 ml    A:  Oliguria (resolved with foley) P:   foley  GASTROINTESTINAL A:  N/V P:   Follow,   HEMATOLOGIC A:  No issues P:  Follow CBC  INFECTIOUS A:  Rt HCAP / aspiration P:   Zosyn/ vanc empiric  ENDOCRINE A:  Relative adrenal insuff  P:   3/4 stress steroids started  NEUROLOGIC A:  Mild dementia P:   Appears lucid & able to  make decisions  TODAY'S SUMMARY:  Weaned dopa to off.  Hypotensive. He has discussed pacer with cardiology and has not wanted it - discussed again with S Minor and said he would do it if necessary. Not clear that he has true insight here.  Treat relative adrenal insuff. PCCM will be available PRN.    Brett Canales Minor ACNP [facility] Pager [phone] till 3 pm If no answer page [phone] 12/15/2013, 11:20 AM  Levy Pupa, MD, PhD 12/15/2013, 12:51 PM  [facility] [phone] or if no answer [phone]

## 2013-12-15 NOTE — Progress Notes (Signed)
eLink Physician-Brief Progress Note Patient Name: Evan IdeClyde M Pennington DOB: 07-06-1922 MRN: [id number]  Date of Service  12/15/2013   HPI/Events of Note  Patient's Hgb has continued to drop to 6.5 from 6.9 despite 1 unit pRBC.  Is on pressors and subq Lovenox.  No sign of active bleeding.   eICU Interventions  Plan: Transfuse 1 u pRBC Serial Hgb D/C Lovenox Consider CT scan if patient's hgb continues to drop   Intervention Category Intermediate Interventions: Bleeding - evaluation and treatment with blood products  Sharie Amorin 12/15/2013, 3:50 AM

## 2013-12-15 NOTE — Progress Notes (Signed)
ANTIBIOTIC CONSULT NOTE - FOLLOW UP  Consult for:  Vancomycin and Zosyn Indication:   Healthcare-associated pneumonia  Allergies  Allergen Reactions  . Lisinopril Other (See Comments)    Unknown, listed on [facility]    Patient Measurements: Height: 5\' 9"  (175.3 cm) Weight: 141 lb 12.1 oz (64.3 kg) IBW/kg (Calculated) : 70.7   Vital Signs: Temp: 99.9 F (37.7 C) (03/04 1300) BP: 104/57 mmHg (03/04 1500) Pulse Rate: 74 (03/04 1400) Intake/Output from previous day: 03/03 0701 - 03/04 0700 In: 1675.2 [P.O.:840; I.V.:518.2; Blood:12; IV Piggyback:305] Out: 1210 [Urine:1210] Intake/Output from this shift: Total I/O In: 335 [Blood:335] Out: 620 [Urine:620]  Labs:  Recent Labs  12/13/13 0400 12/13/13 1900 12/14/13 0425 12/15/13 0315  WBC 7.2 5.2 5.0 5.3  HGB 7.1* 7.0* 6.9* 6.5*  PLT 461* 423* 408* 391  CREATININE 0.69  --  0.63 0.53   Estimated Creatinine Clearance: 56.9 ml/min (by C-G formula based on Cr of 0.53).  Recent Labs  12/15/13 1149  VANCOTROUGH 10.6     Microbiology: Recent Results (from the past 720 hour(s))  URINE CULTURE     Status: None   Collection Time    12/12/13  9:58 AM      Result Value Ref Range Status   Specimen Description URINE, CATHETERIZED   Final   Special Requests NONE   Final   Culture  Setup Time     Final   Value: 12/12/2013 19:18     Performed at [facility] Count     Final   Value: NO GROWTH     Performed at [facility]   Culture     Final   Value: NO GROWTH     Performed at [facility]   Report Status 12/13/2013 FINAL   Final  CULTURE, BLOOD (ROUTINE X 2)     Status: None   Collection Time    12/12/13 10:00 AM      Result Value Ref Range Status   Specimen Description BLOOD BLOOD RIGHT FOREARM   Final   Special Requests BOTTLES DRAWN AEROBIC AND ANAEROBIC 5CC EACH   Final   Culture  Setup Time     Final   Value: 12/12/2013 17:17     Performed at [facility]   Culture      Final   Value:        BLOOD CULTURE RECEIVED NO GROWTH TO DATE CULTURE WILL BE HELD FOR 5 DAYS BEFORE ISSUING A FINAL NEGATIVE REPORT     Performed at [facility]   Report Status PENDING   Incomplete  CULTURE, BLOOD (ROUTINE X 2)     Status: None   Collection Time    12/12/13 11:05 AM      Result Value Ref Range Status   Specimen Description BLOOD BLOOD LEFT FOREARM   Final   Special Requests BOTTLES DRAWN AEROBIC AND ANAEROBIC 5CC EACH   Final   Culture  Setup Time     Final   Value: 12/12/2013 17:17     Performed at [facility]   Culture     Final   Value:        BLOOD CULTURE RECEIVED NO GROWTH TO DATE CULTURE WILL BE HELD FOR 5 DAYS BEFORE ISSUING A FINAL NEGATIVE REPORT     Performed at [facility]   Report Status PENDING   Incomplete  MRSA PCR SCREENING     Status: None   Collection Time  12/12/13  3:33 PM      Result Value Ref Range Status   MRSA by PCR NEGATIVE  NEGATIVE Final   Comment:            The GeneXpert MRSA Assay (FDA     approved for NASAL specimens     only), is one component of a     comprehensive MRSA colonization     surveillance program. It is not     intended to diagnose MRSA     infection nor to guide or     monitor treatment for     MRSA infections.    Anti-infectives   Start     Dose/Rate Route Frequency Ordered Stop   12/13/13 0200  vancomycin (VANCOCIN) 500 mg in sodium chloride 0.9 % 100 mL IVPB     500 mg 100 mL/hr over 60 Minutes Intravenous Every 12 hours 12/12/13 1236     12/12/13 2000  piperacillin-tazobactam (ZOSYN) IVPB 3.375 g     3.375 g 12.5 mL/hr over 240 Minutes Intravenous Every 8 hours 12/12/13 1423     12/12/13 1600  oseltamivir (TAMIFLU) capsule 30 mg  Status:  Discontinued     30 mg Oral 2 times daily 12/12/13 1538 12/13/13 1429   12/12/13 1115  vancomycin (VANCOCIN) IVPB 1000 mg/200 mL premix     1,000 mg 200 mL/hr over 60 Minutes Intravenous  Once 12/12/13 1059 12/14/13 0701   12/12/13 1100   piperacillin-tazobactam (ZOSYN) IVPB 3.375 g     3.375 g 100 mL/hr over 30 Minutes Intravenous  Once 12/12/13 1050 12/12/13 1300      Assessment:  Assisting with Vancomycin therapy for this 80 year-old male, resident of [facility] facility, who was admitted on 3/1 with fever, nausea and vomiting, and CXR indicating pneumonia.  Aspiration is suspected.  Day 4 of Vancomycin therapy.  The current dose is 500 mg IV every 12 hours.  A trough level drawn at 11:49 a.m.  today is reported as 10.6 mcg/ml.  This level is below the therapeutic range.  Day 4 of Zosyn therapy.  The current dose is 3.375 grams IV every 8 hours.  Goals of Therapy:   Vancomycin trough levels 15-20 mcg/ml  Antibiotic doses appropriate for renal function   Eradication of infection  Plan:   Increase the Vancomycin dose to 750 mg IV every 12 hours, with the first dose given early to provide a loading dose.  Continue the current Zosyn dose.  Follow the renal function closely.  Polo Rileylaire Adalin Vanderploeg R.Ph. 12/15/2013 4:29 PM

## 2013-12-15 NOTE — Progress Notes (Signed)
[facility] PROGRESS NOTE  [name]:[id number] DOB: [birth year] DOA: 12/12/2013 PCP: Ginette Otto, MD  Brief summary  The patient is a 80 y.o. year-old male with history of PAF, chronic diastolic heart failure, COPD, HTN, chronic back pain currently wheelchair bound, esophageal diverticulum and dysphagia, and mild dementia who presented with nausea, vomiting, and fever from [facility].  He was found to have septic shock secondary to aspiration pneumonia and bradycardia.   He is still requiring dopamine drip and his bp have been borderline.    Assessment/Plan  Acute hypoxic respiratory failure and septic vs. hypovolemic shock secondary to HCAP vs. Aspiration pneumonia  - Sputum culture not obtained - Urine legionella Ag negative - Urine S. 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  Episode of bradycardia with hypotension improved with atropine in emergency department -  Bradycardia resolving, HR now in 80s -  TSH is 0.280, free t3 and free t4 levels pending.  -  Appreciate Cardiology assistance - Cont ASA  - Amiodarone discontinued - Wean dopamine gtt as tolerated  Nausea and vomiting, unclear etiology. LFTs, lipase, and UA neg  - May be related to viral syndrome such as flu or secondary to primary process of pneumonia  - zofran prn   Chronic diastolic heart failure: possible cardiomegaly and pulmonary edema, appears euvolemic today, good uop yesterday - d/c IV fluids - Hold lasix  - ECHO:  Ejection fraction of 55-60%, mild aortic regurg, left atrium moderately dilated  HTN:  blood pressure  low normal, not on BP medications   COPD:  not on medication, distant smoking hx and not wheezing. Continue duonebs prn   Dysphagia and esophageal diverticulum :  - Dysphasia 3 with thin liquids - Appreciate Speech therapy   Depression and dementia, stable. Continue home meds.  Acute urinary retention, continue foley. UA negative for infection.   Macrocytic anemia, decreasing hgb -  Iron studies pending -  B12 normal low -  Folate is 1325 -  TSH low, free t3 and t4 ordered.  -  Occult stool pending.  -  Transfused 2 units prbc. Awaiting repeat H&H.   Hypokalemia and hypomagnesemia resolved.    Diet:  Dysphagia 3 Access:  Malposition Central line, placed arch first IVF:  Yes Proph:  Lovenox  Code Status: Full Family Communication: Patient alone. He did not want me to talk to the family at first, reported that they dont care.  Disposition Plan:  Weaning dopamine as tolerated.  Start OOB, and PT/OT  Consultants:  Critical care  Cardiology  Procedures:  Chest x-ray  Antibiotics:  Vancomycin from 3/1  Zosyn from 3/1   HPI/Subjective:   Denies SOB, severe cough, wheezing, nausea.  Has not been OOB.  He reports being hungry, and wanted to eat today.   Objective: Filed Vitals:   12/15/13 0400 12/15/13 0430 12/15/13 0500 12/15/13 0700  BP: 113/65 102/56 91/55 147/91  Pulse: 79 78 80 91  Temp: 99 F (37.2 C) 99.1 F (37.3 C) 99.1 F (37.3 C)   TempSrc: Core (Comment)     Resp: 23 24 24  30  Height:      Weight: 64.3 kg (141 lb 12.1 oz)     SpO2: 100% 100% 100% 99%    Intake/Output Summary (Last 24 hours) at 12/15/13 [id number] Last data filed at 12/15/13 0530  Gross per 24 hour  Intake 1675.2 ml  Output   1210 ml  Net  465.2 ml   Filed Weights   12/13/13 1240 12/14/13 0400 12/15/13 0400  Weight: 58 kg (127 lb 13.9 oz) 61.7 kg (136 lb 0.4 oz) 64.3 kg (141 lb 12.1 oz)    Exam:   General:  Thin Caucasian male, No acute distress  HEENT:  NCAT,  MMM  Cardiovascular:  RRR, nl S1, S2 no mrg, 2+ pulses, warm extremities  Respiratory:  Rales at the bilateral bases, no rhonchi or wheeze, no increased WOB  Abdomen:   NABS, soft, NT/ND  MSK:   Normal tone and bulk, no LEE  Neuro:  Grossly intact  Data Reviewed: Basic Metabolic Panel:  Recent Labs Lab 12/12/13 1000 12/13/13 0400 12/14/13 0425 12/15/13 0315  NA 140 143 145 143  K 4.3 3.2* 3.8 4.1  CL 98 107 110 109  CO2 27 24 26 28   GLUCOSE 100* 103* 103* 92  BUN 30* 22 13 10   CREATININE 0.69 0.69 0.63 0.53  CALCIUM 9.3 8.0* 8.2* 8.1*  MG  --  1.7 2.1 1.9  PHOS  --   --  2.6  --    Liver Function Tests:  Recent Labs Lab 12/12/13 1000  AST 18  ALT 16  ALKPHOS 92  BILITOT 0.9  PROT 7.2  ALBUMIN 3.5    Recent Labs Lab 12/12/13 1000  LIPASE 17   No results found for this basename: AMMONIA,  in the last 168 hours CBC:  Recent Labs Lab 12/12/13 1000 12/13/13 0400 12/13/13 1900 12/14/13 0425 12/15/13 0315  WBC 9.6 7.2 5.2 5.0 5.3  NEUTROABS 7.9*  --   --   --   --   HGB 9.1* 7.1* 7.0* 6.9* 6.5*  HCT 26.5* 21.2* 20.5* 20.3* 19.7*  MCV 116.2* 117.1* 117.1* 118.7* 119.4*  PLT 542* 461* 423* 408* 391   Cardiac Enzymes:  Recent Labs Lab 12/12/13 1609 12/12/13 2139 12/13/13 0339  TROPONINI <0.30 <0.30 <0.30   BNP (last 3 results) No results found for this basename: PROBNP,  in the last 8760 hours CBG: No results found for this basename: GLUCAP,  in the last 168 hours  Recent Results (from the past 240 hour(s))  URINE CULTURE     Status: None   Collection Time    12/12/13  9:58 AM      Result Value Ref Range Status   Specimen Description URINE, CATHETERIZED   Final   Special Requests NONE   Final   Culture  Setup Time     Final   Value: 12/12/2013 19:18     [facility]formed at [facility]   [place] Count     Final   Value: NO GROWTH     [facility]formed at [facility]   Culture     Final   Value: NO GROWTH     [facility]   Report Status 12/13/2013 FINAL   Final  CULTURE, BLOOD (ROUTINE X 2)     Status: None   Collection Time    12/12/13 10:00 AM      Result Value Ref Range Status   Specimen Description BLOOD BLOOD RIGHT FOREARM   Final   Special  Requests BOTTLES DRAWN AEROBIC AND ANAEROBIC [facility] EACH   Final   Culture  Setup Time     Final   Value: 12/12/2013 17:17     Performed at [facility]   Culture     Final   Value:        BLOOD CULTURE RECEIVED NO GROWTH TO DATE CULTURE WILL BE HELD FOR 5 DAYS BEFORE ISSUING A FINAL NEGATIVE REPORT     Performed at [facility]   Report Status PENDING   Incomplete  CULTURE, BLOOD (ROUTINE X 2)     Status: None   Collection Time    12/12/13 11:05 AM      Result Value Ref Range Status   Specimen Description BLOOD BLOOD LEFT FOREARM   Final   Special Requests BOTTLES DRAWN AEROBIC AND ANAEROBIC 5CC EACH   Final   Culture  Setup Time     Final   Value: 12/12/2013 17:17     Performed at [facility]   Culture     Final   Value:        BLOOD CULTURE RECEIVED NO GROWTH TO DATE CULTURE WILL BE HELD FOR 5 DAYS BEFORE ISSUING A FINAL NEGATIVE REPORT     Performed at [facility]   Report Status PENDING   Incomplete  MRSA PCR SCREENING     Status: None   Collection Time    12/12/13  3:33 PM      Result Value Ref Range Status   MRSA by PCR NEGATIVE  NEGATIVE Final   Comment:            The GeneXpert MRSA Assay (FDA     approved for NASAL specimens     only), is one component of a     comprehensive MRSA colonization     surveillance program. It is not     intended to diagnose MRSA     infection nor to guide or     monitor treatment for     MRSA infections.     Studies: Dg Chest Port 1 View  12/14/2013   CLINICAL DATA:  Pneumonia.  Central line.  EXAM: PORTABLE CHEST - 1 VIEW  COMPARISON:  12/13/2013  FINDINGS: Right jugular Central venous catheter extends into the right axillary vein and is unchanged. No pneumothorax   Hypoventilation with decreased lung volume and bibasilar atelectasis.  Diffuse bilateral airspace disease may represent pneumonia. This also is unchanged.  Left lower lobe consolidation is slightly more prominent may be due to infiltrate and effusion.  IMPRESSION: Right jugular catheter tip extends into the right axillary vein unchanged  Diffuse bilateral airspace disease may represent ARDS or pneumonia.  Increase in left lower lobe opacity.   Electronically Signed   By: Marlan Palau M.D.   On: 12/14/2013 07:22    Scheduled Meds: . aspirin  81 mg Oral Daily  . atropine  0.5 mg Intravenous Once  . divalproex  125 mg Oral BID  . docusate sodium  100 mg Oral BID  . donepezil  5 mg Oral QHS  . dutasteride  0.5 mg Oral Daily  . feeding supplement (ENSURE COMPLETE)  237 mL Oral TID BM  . fluticasone  1 puff Inhalation BID  . ipratropium-albuterol  3 mL Nebulization QID  . mirtazapine  7.5 mg Oral QHS  . pantoprazole  40 mg Oral Daily  . piperacillin-tazobactam (ZOSYN)  IV  3.375 g Intravenous Q8H  . polyethylene glycol  17 g  Oral Daily  . senna  2 tablet Oral QHS  . sertraline  50 mg Oral Daily  . tamsulosin  0.4 mg Oral Daily  . temazepam  30 mg Oral QHS  . vancomycin  500 mg Intravenous Q12H   Continuous Infusions: . DOPamine 6 mcg/kg/min (12/15/13 0530)    Active Problems:   HYPERTENSION, BENIGN   Acute respiratory failure   COPD (chronic obstructive pulmonary disease)   Chronic diastolic CHF (congestive heart failure)   Aspiration pneumonia   Nausea and vomiting   Bradycardia- transient, asymptomatic   HCAP (healthcare-associated pneumonia)   Septic shock(785.52)- on Dopamine   RBBB   1St degree AV block    Time spent: 30 min    Tiffny Gemmer  [facility] Pager [phone]. If 7PM-7AM, please contact night-coverage at [email or web address], password [facility] 12/15/2013, 7:22 AM  LOS: 3 days

## 2013-12-15 NOTE — Progress Notes (Signed)
Per pharmacist Marchelle FolksAmanda in [place]/ICU documenting pyxis issue -took out fentayl for pt; pyxis wont let me waste more than im giving the patient Patient recevied 12.5 mcg and i wasted the remaining balance in the bottle -Kenedee Molesky rn

## 2013-12-15 NOTE — Progress Notes (Signed)
OT Cancellation Note  Patient Details Name: [name] MRN: [id number] DOB: [birth year]   Cancelled Treatment:    Reason Eval/Treat Not Completed: OT screened, no needs identified, will sign off.  Pt with dementia and w/c bound.  Had total A for adls.    Ruther Ephraim 12/15/2013, 1:22 PM Marica OtterMaryellen Christabel Camire, OTR/L [phone] 12/15/2013

## 2013-12-16 DIAGNOSIS — I509 Heart failure, unspecified: Secondary | ICD-10-CM

## 2013-12-16 DIAGNOSIS — R509 Fever, unspecified: Secondary | ICD-10-CM

## 2013-12-16 DIAGNOSIS — J449 Chronic obstructive pulmonary disease, unspecified: Secondary | ICD-10-CM

## 2013-12-16 LAB — CBC
HCT: 26.1 % — ABNORMAL LOW (ref 39.0–52.0)
HEMATOCRIT: 24.3 % — AB (ref 39.0–52.0)
HEMATOCRIT: 26.4 % — AB (ref 39.0–52.0)
HEMOGLOBIN: 8.7 g/dL — AB (ref 13.0–17.0)
Hemoglobin: 9 g/dL — ABNORMAL LOW (ref 13.0–17.0)
Hemoglobin: 8.8 g/dL — ABNORMAL LOW (ref 13.0–17.0)
MCH: 36.3 pg — ABNORMAL HIGH (ref 26.0–34.0)
MCH: 37.3 pg — ABNORMAL HIGH (ref 26.0–34.0)
MCH: 35.1 pg — AB (ref 26.0–34.0)
MCHC: 34.5 g/dL (ref 30.0–36.0)
MCHC: 35.8 g/dL (ref 30.0–36.0)
MCHC: 33.3 g/dL (ref 30.0–36.0)
MCV: 105.2 fL — AB (ref 78.0–100.0)
MCV: 104.3 fL — ABNORMAL HIGH (ref 78.0–100.0)
MCV: 105.2 fL — AB (ref 78.0–100.0)
PLATELETS: 368 10*3/uL (ref 150–400)
PLATELETS: 407 10*3/uL — AB (ref 150–400)
Platelets: 353 10*3/uL (ref 150–400)
RBC: 2.48 MIL/uL — AB (ref 4.22–5.81)
RBC: 2.33 MIL/uL — AB (ref 4.22–5.81)
RBC: 2.51 MIL/uL — AB (ref 4.22–5.81)
WBC: 4.4 10*3/uL (ref 4.0–10.5)
WBC: 4.6 10*3/uL (ref 4.0–10.5)
WBC: 5.7 10*3/uL (ref 4.0–10.5)

## 2013-12-16 LAB — TYPE AND SCREEN
ABO/RH(D): A POS
Antibody Screen: NEGATIVE
Unit division: 0
Unit division: 0

## 2013-12-16 LAB — BASIC METABOLIC PANEL
BUN: 16 mg/dL (ref 6–23)
CO2: 26 meq/L (ref 19–32)
Calcium: 8.3 mg/dL — ABNORMAL LOW (ref 8.4–10.5)
Chloride: 107 mEq/L (ref 96–112)
Creatinine, Ser: 0.48 mg/dL — ABNORMAL LOW (ref 0.50–1.35)
GFR calc Af Amer: >90 mL/min (ref >90)
GLUCOSE: 197 mg/dL — AB (ref 70–99)
Potassium: 4.3 mEq/L (ref 3.7–5.3)
SODIUM: 143 meq/L (ref 137–147)

## 2013-12-16 LAB — MAGNESIUM: MAGNESIUM: 1.8 mg/dL (ref 1.5–2.5)

## 2013-12-16 LAB — OCCULT BLOOD X 1 CARD TO LAB, STOOL: Fecal Occult Bld: NEGATIVE

## 2013-12-16 MED ORDER — SODIUM CHLORIDE 0.9 % IJ SOLN: 10.0000 mL | INTRAMUSCULAR | Status: DC | PRN

## 2013-12-16 MED ORDER — SODIUM CHLORIDE 0.9 % IV SOLN
INTRAVENOUS | Status: DC
  Administered 2013-12-16: 11:00:00 via INTRAVENOUS

## 2013-12-16 MED ORDER — SODIUM CHLORIDE 0.9 % IJ SOLN: 10.0000 mL | Freq: Two times a day (BID) | INTRAMUSCULAR | Status: DC

## 2013-12-16 NOTE — Progress Notes (Signed)
CSW continuing to follow for disposition planning.  Pt from [facility] and plans to return upon discharge.  CSW noted that pt has transfer orders and contacted [facility] to update facility.  CSW discussed with facility that PT attempted evaluation and pt reported that facility provides 2 person assist with transfers to wheelchair. [facility] confirmed that this is accurate and did not feel that PT needed to re-evaluate pt. Per [facility], facility has PT at ALF and facility feels that pt will respond better to the facility PT.   Pt feels confident in the care the [facility] has been providing per our discussion during initial assessment.  CSW faxed clinicals to [facility].   CSW to continue to follow and assist with pt disposition plans.  Loletta SpecterSuzanna Kidd, MSW, LCSW Clinical Social Work [phone]

## 2013-12-16 NOTE — Progress Notes (Signed)
[facility] PROGRESS NOTE  Evan IdeClyde M Pennington ZOX:[id number] DOB: 1922/07/19 DOA: 12/12/2013 PCP: Ginette OttoSTONEKING,HAL THOMAS, MD  Brief summary  The patient is a 80 y.o. year-old male with history of PAF, chronic diastolic heart failure, COPD, HTN, chronic back pain currently wheelchair bound, esophageal diverticulum and dysphagia, and mild dementia who presented with nausea, vomiting, and fever from [facility].  He was found to have septic shock secondary to aspiration pneumonia and bradycardia.   He is still requiring dopamine drip and his bp have been borderline.    Assessment/Plan  Acute hypoxic respiratory failure and septic vs. hypovolemic shock secondary to HCAP vs. Aspiration pneumonia  - Sputum culture not obtained - Urine legionella Ag negative - Urine S. pneumo Ag negative - Flu PCR negative - blood cultures NGTD - Continue Vancomycin and zosyn day 5, will start transitioning to oral antibiotics from tomorrow.  - Wean oxygen as tolerated  - troponins negative - Cortisol level 13, within normal limits - he was weaned off dopamine gtt yesterday and PCCM signed off.    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  Episode of bradycardia with hypotension improved with atropine in emergency department -  Bradycardia resolving, HR now in 80s -  TSH is 0.280, normal free t4 and low free t3 -  Appreciate Cardiology assistance - Cont ASA  - Amiodarone discontinued Nausea and vomiting, unclear etiology. LFTs, lipase, and UA neg  - May be related to viral syndrome such as flu or secondary to primary process of pneumonia  - zofran prn   Chronic diastolic heart failure: possible cardiomegaly and pulmonary edema, appears euvolemic today, good uop yesterday - d/c IV fluids - Hold lasix  - ECHO:  Ejection fraction of 55-60%, mild aortic regurg, left atrium moderately dilated  HTN:  blood  pressure low normal, not on BP medications   COPD:  not on medication, distant smoking hx and not wheezing. Continue duonebs prn   Dysphagia and esophageal diverticulum :  - Dysphasia 3 with thin liquids - Appreciate Speech therapy   Depression and dementia, stable. Continue home meds.  Acute urinary retention, continue foley. UA negative for infection.   Macrocytic anemia, decreasing hgb -  Iron studies pending -  B12 normal low -  Folate is 1325 -  TSH low, free t3 and t4 ordered.  -  Occult stool pending.  -  Transfused 2 units prbc. Repeat H&H s 9/26.   Hypokalemia and hypomagnesemia resolved.    Diet:  Dysphagia 3 Access:  Malposition Central line, placed arch first IVF:  Yes Proph:  Lovenox  Code Status: Full Family Communication: Patient alone. He did not want me to talk to the family at first, reported that they dont care.  Disposition Plan:  Transfer to telemetry.   Consultants:  Critical care  Cardiology  Procedures:  Chest x-ray  Antibiotics:  Vancomycin from 3/1  Zosyn from 3/1   HPI/Subjective:   Denies SOB, severe cough, wheezing, nausea.  Has not been OOB.  He reports being hungry, and wanted to eat today.  He reports feeling little better, wants to go back home.   Objective: Filed Vitals:   12/16/13 0200 12/16/13 0300 12/16/13 0400 12/16/13 0730  BP: 133/69 129/65 132/64   Pulse:      Temp: 98.6 F (37 C) 98.4 F (36.9 C) 96.8 F (36 C)   TempSrc: Core (Comment)  Core (Comment)  Resp: 27 26 21    Height:      Weight:   64.7 kg (142 lb 10.2 oz)   SpO2: 93% 93% 93% 95%    Intake/Output Summary (Last 24 hours) at 12/16/13 0853 Last data filed at 12/16/13 0700  Gross per 24 hour  Intake   2235 ml  Output   1446 ml  Net    789 ml   Filed Weights   12/14/13 0400 12/15/13 0400 12/16/13 0400  Weight: 61.7 kg (136 lb 0.4 oz) 64.3 kg (141 lb 12.1 oz) 64.7 kg (142 lb 10.2 oz)    Exam:   General:  Thin Caucasian male, No acute  distress  HEENT:  NCAT, MMM  Cardiovascular:  RRR, nl S1, S2 no mrg, 2+ pulses, warm extremities  Respiratory:  Rales at the bilateral bases, no rhonchi or wheeze, no increased WOB  Abdomen:   NABS, soft, NT/ND  MSK:   Normal tone and bulk, no LEE  Neuro:  Grossly intact  Data Reviewed: Basic Metabolic Panel:  Recent Labs Lab 12/12/13 1000 12/13/13 0400 12/14/13 0425 12/15/13 0315 12/16/13 0425  NA 140 143 145 143 143  K 4.3 3.2* 3.8 4.1 4.3  CL 98 107 110 109 107  CO2 27 24 26 28 26   GLUCOSE 100* 103* 103* 92 197*  BUN 30* 22 13 10 16   CREATININE 0.69 0.69 0.63 0.53 0.48*  CALCIUM 9.3 8.0* 8.2* 8.1* 8.3*  MG  --  1.7 2.1 1.9 1.8  PHOS  --   --  2.6  --   --    Liver Function Tests:  Recent Labs Lab 12/12/13 1000  AST 18  ALT 16  ALKPHOS 92  BILITOT 0.9  PROT 7.2  ALBUMIN 3.5    Recent Labs Lab 12/12/13 1000  LIPASE 17   No results found for this basename: AMMONIA,  in the last 168 hours CBC:  Recent Labs Lab 12/12/13 1000  12/14/13 0425 12/15/13 0315 12/15/13 1149 12/15/13 2000 12/16/13 0425  WBC 9.6  < > 5.0 5.3 5.9 4.4 4.4  NEUTROABS 7.9*  --   --   --   --   --   --   HGB 9.1*  < > 6.9* 6.5* 9.0* 8.5* 9.0*  HCT 26.5*  < > 20.3* 19.7* 26.5* 24.8* 26.1*  MCV 116.2*  < > 118.7* 119.4* 106.9* 105.1* 105.2*  PLT 542*  < > 408* 391 371 364 368  < > = values in this interval not displayed. Cardiac Enzymes:  Recent Labs Lab 12/12/13 1609 12/12/13 2139 12/13/13 0339  TROPONINI <0.30 <0.30 <0.30   BNP (last 3 results) No results found for this basename: PROBNP,  in the last 8760 hours CBG: No results found for this basename: GLUCAP,  in the last 168 hours  Recent Results (from the past 240 hour(s))  URINE CULTURE     Status: None   Collection Time    12/12/13  9:58 AM      Result Value Ref Range Status   Specimen Description URINE, CATHETERIZED   Final   Special Requests NONE   Final   Culture  Setup Time     Final   Value:  12/12/2013 19:18     Performed at [facility] Count     Final   Value: NO GROWTH     Performed at [facility]   Culture     Final   Value: NO GROWTH  Performed at [facility]   Report Status 12/13/2013 FINAL   Final  CULTURE, BLOOD (ROUTINE X 2)     Status: None   Collection Time    12/12/13 10:00 AM      Result Value Ref Range Status   Specimen Description BLOOD BLOOD RIGHT FOREARM   Final   Special Requests BOTTLES DRAWN AEROBIC AND ANAEROBIC 5CC EACH   Final   Culture  Setup Time     Final   Value: 12/12/2013 17:17     Performed at [facility]   Culture     Final   Value:        BLOOD CULTURE RECEIVED NO GROWTH TO DATE CULTURE WILL BE HELD FOR 5 DAYS BEFORE ISSUING A FINAL NEGATIVE REPORT     Performed at [facility]   Report Status PENDING   Incomplete  CULTURE, BLOOD (ROUTINE X 2)     Status: None   Collection Time    12/12/13 11:05 AM      Result Value Ref Range Status   Specimen Description BLOOD BLOOD LEFT FOREARM   Final   Special Requests BOTTLES DRAWN AEROBIC AND ANAEROBIC 5CC EACH   Final   Culture  Setup Time     Final   Value: 12/12/2013 17:17     Performed at [facility]   Culture     Final   Value:        BLOOD CULTURE RECEIVED NO GROWTH TO DATE CULTURE WILL BE HELD FOR 5 DAYS BEFORE ISSUING A FINAL NEGATIVE REPORT     Performed at [facility]   Report Status PENDING   Incomplete  MRSA PCR SCREENING     Status: None   Collection Time    12/12/13  3:33 PM      Result Value Ref Range Status   MRSA by PCR NEGATIVE  NEGATIVE Final   Comment:            The GeneXpert MRSA Assay (FDA     approved for NASAL specimens     only), is one component of a     comprehensive MRSA colonization     surveillance program. It is not     intended to diagnose MRSA     infection nor to guide or     monitor treatment for     MRSA infections.     Studies: Dg Chest Port 1 View  12/15/2013   CLINICAL  DATA:  Shortness breath.  EXAM: PORTABLE CHEST - 1 VIEW  COMPARISON:  Chest x-ray 12/14/2013.  FINDINGS: Right internal jugular single-lumen porta cath with tip laterally directed into the right axillary vein. Patchy multifocal interstitial and airspace disease is again noted throughout the lungs bilaterally, most confluent in the left base and throughout the entire right lung. Overall, aeration has slightly improved in the left base compared to yesterday's examination. Decreasing small left pleural effusion. No evidence of pulmonary edema. Heart size is upper limits of normal. Mediastinal contours are within normal limits. Atherosclerosis in the thoracic aorta. Markedly low lung volumes.  IMPRESSION: 1. Malpositioned right-sided porta cath again noted, with tip directed in the right axillary vein. 2. Low lung volumes with persistent multifocal interstitial and airspace disease in the lungs bilaterally compatible with multilobar pneumonia. Aeration has slightly improved in the left lower lobe. Decreasing small left pleural effusion. 3. Atherosclerosis.   Electronically Signed   By: Trudie Reed M.D.   On: 12/15/2013 09:24  Scheduled Meds: . aspirin  81 mg Oral Daily  . atropine  0.5 mg Intravenous Once  . divalproex  125 mg Oral BID  . docusate sodium  100 mg Oral BID  . donepezil  5 mg Oral QHS  . dutasteride  0.5 mg Oral Daily  . feeding supplement (ENSURE COMPLETE)  237 mL Oral TID BM  . fluticasone  1 puff Inhalation BID  . hydrocortisone sod succinate (SOLU-CORTEF) inj  50 mg Intravenous Q6H  . ipratropium-albuterol  3 mL Nebulization QID  . mirtazapine  7.5 mg Oral QHS  . pantoprazole  40 mg Oral Daily  . piperacillin-tazobactam (ZOSYN)  IV  3.375 g Intravenous Q8H  . polyethylene glycol  17 g Oral Daily  . senna  2 tablet Oral QHS  . sertraline  50 mg Oral Daily  . tamsulosin  0.4 mg Oral Daily  . temazepam  30 mg Oral QHS  . vancomycin  750 mg Intravenous Q12H   Continuous  Infusions:    Active Problems:   HYPERTENSION, BENIGN   Acute respiratory failure   COPD (chronic obstructive pulmonary disease)   Chronic diastolic CHF (congestive heart failure)   Aspiration pneumonia   Nausea and vomiting   Bradycardia- transient, asymptomatic   HCAP (healthcare-associated pneumonia)   Septic shock(785.52)- on Dopamine   RBBB   1St degree AV block    Time spent: 30 min    Valerye Kobus  [facility] Pager [phone]. If 7PM-7AM, please contact night-coverage at [email or web address], password [facility] 12/16/2013, 8:53 AM  LOS: 4 days

## 2013-12-16 NOTE — Progress Notes (Signed)
Agree with the previous nurse assessment. Will continue to monitor patient. [name], Evan BroachAllison Pennington

## 2013-12-16 NOTE — Progress Notes (Signed)
Report given to [facility] RN. Patient assessment has not changed from am. Patient is stable at transfer.

## 2013-12-16 NOTE — Progress Notes (Signed)
Speech Language Pathology Treatment: Dysphagia  Patient Details Name: [name] MRN: [id number] DOB: 1922-07-02 Today's Date: 12/16/2013 Time: 8144-8185 SLP Time Calculation (min): 20 min  Assessment / Plan / Recommendation Clinical Impression  Pt with confusion this am- asking if he was eating "down stairs".  SLP facilitated session by helping RN/pt with set up of breakfast tray and reinforcement of swallow precautions to mitigate aspiration risk.  Pt consumed only 1 bite eggs and sausage, few sips of milk and coffee - no s/s of aspiration or airway compromise.  He did follow solids with liquids without cues which will maximize airway protection.  Pt also eats at slow rate thankfully.  Poor intake as pt declined to consume more eggs.   SLP verbally reviewed previous swallow eval results with pt including MBS, esophagram and clinical reasoning for strategies- he did not recall previous testing/SLP sessions.  Pt stated "leave me alone" and reported his motility issues were "5 years ago".  Advised pt to multifactorial issues that do not improve with ago and goal is to manage them.  Taped precaution paper to pt's bedside table for reinforcement but pt's cognition may prohibit effective compliance.  All education is completed and no further SLP indicated at this time.     HPI HPI: 80 yo male adm from [facility] with N/V fevers.  Pt found to have right infiltrate on CXR + hypotension.  PMH + for heart failure, mild dementia, COPD, wheelchair bound, previous smoker stopped in 1955, dysphagia dating back to at least 2012.  Bedside swallow evaluation completed yesterday with recommendations for dys3/thin diet with precautions given h/o dysphagia.  Today pt seen for dietary tolerance and use of compensation strategies, pt education.      Pertinent Vitals Afebrile, decreased  SLP Plan  All goals met    Recommendations Diet recommendations: Dysphagia 3 (mechanical soft);Thin liquid Liquids provided via:  Straw;Cup Medication Administration: Whole meds with liquid Supervision: Patient able to self feed;Intermittent supervision to cue for compensatory strategies Compensations: Slow rate;Small sips/bites;Follow solids with liquid (intermittent dry swallow) Postural Changes and/or Swallow Maneuvers: Seated upright 90 degrees;Upright 30-60 min after meal              Oral Care Recommendations: Oral care BID Follow up Recommendations: None Plan: All goals met    [place], [place] [facility] SLP [phone]

## 2013-12-16 NOTE — Progress Notes (Signed)
CARE MANAGEMENT NOTE 12/16/2013  Patient:  [name],[name]   Account Number:  [id number]  Date Initiated:  12/13/2013  Documentation initiated by:  DAVIS,RHONDA  Subjective/Objective Assessment:   pt with confirmed pna, required vasopressorsdue hypotension, bradycardic event /     Action/Plan:   snf-[facility]   Anticipated DC Date:  12/19/2013   Anticipated DC Plan:  SKILLED NURSING FACILITY  In-house referral  Clinical Social Worker      DC Planning Services  NA      [facility]   Choice offered to / List presented to:  NA   DME arranged  NA      DME agency  NA     HH arranged  NA      HH agency  NA   Status of service:  In process, will continue to follow Medicare Important Message given?  NA - LOS <3 / Initial given by admissions (If response is "NO", the following Medicare IM given date fields will be blank) Date Medicare IM given:   Date Additional Medicare IM given:    Discharge Disposition:    Per UR Regulation:  Reviewed for med. necessity/level of care/duration of stay  If discussed at Long Length of Stay Meetings, dates discussed:    Comments:  [id number]/Rhonda Stark JockDavis, RN, BSN, [place] [phone] Chart Reviewed for discharge and hospital needs. Discharge needs at time of review:  None present will follow for needs. Review of patient progress due on [id number]. Transferred from sdu to acute care telemtry bed.  iv vanco and iv solumedrol continuing.  [id number]/Rhonda Earlene Plateravis, RN, BSN, [place]M [phone] Chart Reviewed for discharge and hospital needs. Discharge needs at time of review:  None present will follow for needs. Review of patient progress due on [id number].

## 2013-12-17 ENCOUNTER — Inpatient Hospital Stay (HOSPITAL_COMMUNITY): Payer: Medicare Other

## 2013-12-17 LAB — BASIC METABOLIC PANEL
BUN: 15 mg/dL (ref 6–23)
CALCIUM: 8.4 mg/dL (ref 8.4–10.5)
CHLORIDE: 106 meq/L (ref 96–112)
CO2: 28 meq/L (ref 19–32)
Creatinine, Ser: 0.45 mg/dL — ABNORMAL LOW (ref 0.50–1.35)
GFR calc Af Amer: >90 mL/min (ref >90)
GFR calc non Af Amer: >90 mL/min (ref >90)
GLUCOSE: 127 mg/dL — AB (ref 70–99)
Potassium: 4.3 mEq/L (ref 3.7–5.3)
Sodium: 143 mEq/L (ref 137–147)

## 2013-12-17 LAB — CBC
HEMATOCRIT: 27.4 % — AB (ref 39.0–52.0)
Hemoglobin: 9.3 g/dL — ABNORMAL LOW (ref 13.0–17.0)
MCH: 36 pg — AB (ref 26.0–34.0)
MCHC: 33.9 g/dL (ref 30.0–36.0)
MCV: 106.2 fL — ABNORMAL HIGH (ref 78.0–100.0)
PLATELETS: 443 10*3/uL — AB (ref 150–400)
RBC: 2.58 MIL/uL — ABNORMAL LOW (ref 4.22–5.81)
RDW: 23.5 % — AB (ref 11.5–15.5)
WBC: 9.8 10*3/uL (ref 4.0–10.5)

## 2013-12-17 LAB — MAGNESIUM: Magnesium: 1.8 mg/dL (ref 1.5–2.5)

## 2013-12-17 MED ORDER — VITAMIN B-12 1000 MCG PO TABS: 1000.0000 ug | ORAL_TABLET | Freq: Every day | ORAL | Status: DC

## 2013-12-17 MED ORDER — ENSURE COMPLETE PO LIQD: 237.0000 mL | Freq: Three times a day (TID) | ORAL | Status: AC

## 2013-12-17 MED ORDER — LEVOFLOXACIN 750 MG PO TABS: 750.0000 mg | ORAL_TABLET | Freq: Every day | ORAL | Status: DC

## 2013-12-17 MED ORDER — TEMAZEPAM 30 MG PO CAPS: 30.0000 mg | ORAL_CAPSULE | Freq: Every day | ORAL | Status: AC

## 2013-12-17 MED ORDER — HYDROCORTISONE NA SUCCINATE PF 100 MG IJ SOLR
50.0000 mg | Freq: Two times a day (BID) | INTRAMUSCULAR | Status: DC
  Filled 2013-12-17 (×2): qty 1

## 2013-12-17 MED ORDER — SENNA 8.6 MG PO TABS: 2.0000 | ORAL_TABLET | Freq: Every evening | ORAL | Status: DC | PRN

## 2013-12-17 MED ORDER — IPRATROPIUM-ALBUTEROL 0.5-2.5 (3) MG/3ML IN SOLN: 3.0000 mL | RESPIRATORY_TRACT | Status: DC | PRN

## 2013-12-17 MED ORDER — DSS 100 MG PO CAPS: 100.0000 mg | ORAL_CAPSULE | Freq: Two times a day (BID) | ORAL | Status: AC

## 2013-12-17 NOTE — Progress Notes (Signed)
Writer called and gave report to "Pam" at assisted living, [facility]. Pt ready to be transfer "home" when transportation arrives.

## 2013-12-17 NOTE — Progress Notes (Signed)
[facility] with [facility] for pt to return.  CSW arranged ambulance transport for pt via [facility]. (Service Request ID#: [id number]).  CSW notified pt at bedside.   No further social work needs identified at this time.  CSW signing off.   Loletta SpecterSuzanna Gerrick Ray, MSW, LCSW Clinical Social Work Coverage for [employer] cell #: [phone]

## 2013-12-17 NOTE — Progress Notes (Signed)
Pt left at this time with EMS, going back to [facility]. Pt alert, oriented, and aware of transfer.

## 2013-12-17 NOTE — Discharge Summary (Addendum)
Physician Discharge Summary  [name] ZOX:[id number] DOB: 1922/06/20 DOA: 12/12/2013  PCP: Ginette Otto, MD  Admit date: 12/12/2013 Discharge date: 12/17/2013  Time spent: 35 minutes  Recommendations for Outpatient Follow-up:  1. Follow up with PCP in one week 2. Follow up with cardiology in one week 3. Follow up with thyroid panel in 4 week.s  4. We have stopped amiodarone because of bradycardia , please follow up with cardiology in one week.   Discharge Diagnoses:  Acute respiratory failure from health care associated pneumonia and sepsis.  Active Problems:   HYPERTENSION, BENIGN   Acute respiratory failure   COPD (chronic obstructive pulmonary disease)   Chronic diastolic CHF (congestive heart failure)   Aspiration pneumonia   Nausea and vomiting   Bradycardia- transient, asymptomatic   HCAP (healthcare-associated pneumonia)   Septic shock(785.52)- on Dopamine   RBBB   1St degree AV block   Discharge Condition: much improved.   Diet recommendation: dysphagia 3 with thin liquids.   Filed Weights   12/14/13 0400 12/15/13 0400 12/16/13 0400  Weight: 61.7 kg (136 lb 0.4 oz) 64.3 kg (141 lb 12.1 oz) 64.7 kg (142 lb 10.2 oz)    History of present illness:  The patient is a 80 y.o. year-old male with history of PAF, chronic diastolic heart failure, COPD, HTN, chronic back pain currently wheelchair bound, esophageal diverticulum and dysphagia, and mild dementia who presented with nausea, vomiting, and fever from [facility]. He was found to have septic shock secondary to aspiration pneumonia and bradycardia. He was started on dopamine drip and cardiology and PCCM consulted. Dopamine drip was weaned off on 3/4 and pCCM signed off. Cardiology recommended pacemaker but patient politely refused. Amiodarone was discontinued because of bradycardia. He was completed 6 days of IV antibiotics and we have transitioned him to oral antibiotics to complete the course.    Hospital  Course:   Acute hypoxic respiratory failure and septic vs. hypovolemic shock secondary to HCAP vs. Aspiration pneumonia  - Sputum culture not obtained  - Urine legionella Ag negative  - Urine S. pneumo Ag negative  - Flu PCR negative  - blood cultures NGTD  - Completed 6 days of IV antibiotics and transition to po levaquin for 5 days  to complete the course.  - Wean oxygen as tolerated  - troponins negative  - Cortisol level 13, within normal limits  - he was weaned off dopamine gtt on 3/4 and PCCM signed off. His BP parameters are ok.  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 Episode of bradycardia with hypotension improved with atropine in emergency department  - Bradycardia resolving, HR now in 80s  - TSH is 0.280, normal free t4 and low free t3 , stopped amiodarone, recommend checking the panel in 4 weeks.  - Appreciate Cardiology assistance  - Cont ASA  - Amiodarone discontinued  Nausea and vomiting,  Resolved. unclear etiology. LFTs, lipase, and UA neg  - May be related to viral syndrome such as flu or secondary to primary process of pneumonia   Chronic diastolic heart failure: possible cardiomegaly and pulmonary edema, appears euvolemic today, good uop yesterday  - ECHO: Ejection fraction of 55-60%, mild aortic regurg, left atrium moderately dilated  HTN: blood pressure low normal, not on BP medications  COPD: not on medication, distant smoking hx and not wheezing. Continue duonebs prn  Dysphagia and esophageal diverticulum :  -  Dysphasia 3 with thin liquids  - Appreciate Speech therapy  Depression and dementia, stable. Continue home meds.  Acute urinary retention, : UA negative for infection.  Macrocytic anemia, decreasing hgb  - iron studies are normal.  - B12 normal low , wills tart supplementing it.  - Folate is 1325  - TSH low, free t3 and t4 ordered, FREE t4 is normal  and free t3 is slightly low. Pt is on amiodarone, which would explain the abnormal thyroid panel. Since we are stopping the amiodarone, recommend checking the thyroid function tests in 4 weeks.  - Occult stool is negative.  - Transfused 2 units prbc. Repeat H&H s 9/26.   Hypokalemia and hypomagnesemia resolved.   Procedures:  CXR  Consultations: Critical care Cardiology   Discharge Exam: Filed Vitals:   12/17/13 0445  BP: 130/66  Pulse: 58  Temp: 97.6 F (36.4 C)  Resp: 18   General: Thin Caucasian male, No acute distress  HEENT: NCAT, MMM  Cardiovascular: RRR, nl S1, S2 no mrg, 2+ pulses, warm extremities  Respiratory: Rales at the bilateral bases, no rhonchi or wheeze, no increased WOB  Abdomen: NABS, soft, NT/ND  MSK: Normal tone and bulk, no LEE  Neuro: Grossly intact    Discharge Instructions  Discharge Orders   Future Orders Complete By Expires   Diet - low sodium heart healthy  As directed    Discharge instructions  As directed    Comments:     Follow up with PCP in one week follow up with Dr Graciela Husbands in one week w Hermine Messick stopped the amiodarone because of bradycardia.  You need follow up with cardiology on discharge.       Medication List    STOP taking these medications       amiodarone 200 MG tablet  Commonly known as:  PACERONE      TAKE these medications       aspirin 81 MG tablet  Take 81 mg by mouth daily.     Calcium + D3 600-200 MG-UNIT Tabs  Take 1 tablet by mouth daily.     divalproex 125 MG capsule  Commonly known as:  DEPAKOTE SPRINKLE  125 mg 2 (two) times daily.     donepezil 5 MG tablet  Commonly known as:  ARICEPT  Take 5 mg by mouth at bedtime.     DSS 100 MG Caps  Take 100 mg by mouth 2 (two) times daily.     dutasteride 0.5 MG capsule  Commonly known as:  AVODART  Take 0.5 mg by mouth daily.     feeding supplement (ENSURE COMPLETE) Liqd  Take 237 mLs by mouth 3 (three) times daily between meals.     FLOVENT HFA 110  MCG/ACT inhaler  Generic drug:  fluticasone  Inhale 1 puff into the lungs 2 (two) times daily.     furosemide 40 MG tablet  Commonly known as:  LASIX  Take 40 mg by mouth daily.     ipratropium-albuterol 0.5-2.5 (3) MG/3ML Soln  Commonly known as:  DUONEB  Take 3 mLs by nebulization every 4 (four) hours as needed.     levofloxacin 750 MG tablet  Commonly known as:  LEVAQUIN  Take 1 tablet (750 mg total) by mouth daily.     Melatonin 5 MG Tabs  Take 5 mg by mouth daily.     mineral oil liquid  Place 2 drops into each ear once a week.     mirtazapine 7.5 MG  tablet  Commonly known as:  REMERON  Take 1 tablet (7.5 mg total) by mouth at bedtime.     omeprazole 20 MG capsule  Commonly known as:  PRILOSEC  Take 20 mg by mouth daily.     polyethylene glycol packet  Commonly known as:  MIRALAX / GLYCOLAX  Take 17 g by mouth daily as needed for mild constipation.     senna 8.6 MG Tabs tablet  Commonly known as:  SENOKOT  Take 2 tablets (17.2 mg total) by mouth at bedtime as needed for mild constipation.     sertraline 50 MG tablet  Commonly known as:  ZOLOFT  Take 50 mg by mouth daily.     sodium chloride 0.65 % nasal spray  Commonly known as:  OCEAN  Place 2 sprays into the nose 3 (three) times daily as needed. For dryness.     tamsulosin 0.4 MG Caps capsule  Commonly known as:  FLOMAX  Take 0.4 mg by mouth daily.     temazepam 30 MG capsule  Commonly known as:  RESTORIL  Take 30 mg by mouth at bedtime.     Vitamin D-3 1000 UNITS Caps  Take by mouth. Take once daily       Allergies  Allergen Reactions  . Lisinopril Other (See Comments)    Unknown, listed on [facility]       Follow-up Information   Follow up with Ginette OttoSTONEKING,HAL THOMAS, MD. Schedule an appointment as soon as possible for a visit in 1 week.   Specialty:  Internal Medicine   Contact information:   [address] [place] [address] [phone]       Follow up with Sherryl MangesSteven Klein,  MD. Schedule an appointment as soon as possible for a visit in 1 week.   Specialty:  Cardiology   Contact information:   [address] [place] [address] [phone]        The results of significant diagnostics from this hospitalization (including imaging, microbiology, ancillary and laboratory) are listed below for reference.    Significant Diagnostic Studies: Dg Chest 2 View  12/17/2013   CLINICAL DATA:  Weakness and kyphosis.  Pneumonia.  EXAM: CHEST  2 VIEW  COMPARISON:  DG CHEST 1V PORT dated 12/15/2013; DG CHEST 1V PORT dated 12/12/2013; DG CHEST 1V PORT dated 12/13/2013; DG CHEST 1V PORT dated 12/14/2013; CT CHEST W/O CM dated 11/20/2011  FINDINGS: Low lung volumes. The patient is rotated to the Right on today's radiograph, reducing diagnostic sensitivity and specificity.  Mildly reduced interstitial opacity in the right lung, no more peripheral in location. Retrocardiac airspace opacity persists on the left. Borderline cardiomegaly. Mild blunting of both costophrenic angles.  Thoracic spondylosis noted.  Right central line tip projects over the right axilla.  IMPRESSION: 1. Persistent retrocardiac airspace opacity could represent atelectasis or pneumonia. 2. Further clearing of the underlying interstitial opacity. There is some residual peripheral interstitial accentuation on the right side. 3. Borderline cardiomegaly. 4. Suspected trace bilateral pleural effusions. 5. Right central line tip continues to project over the right axilla. This may be within the axillary vein.   Electronically Signed   By: Herbie BaltimoreWalt  Liebkemann M.D.   On: 12/17/2013 11:31   Dg Chest Port 1 View  12/15/2013   CLINICAL DATA:  Shortness breath.  EXAM: PORTABLE CHEST - 1 VIEW  COMPARISON:  Chest x-ray 12/14/2013.  FINDINGS: Right internal jugular single-lumen porta cath with tip laterally directed into the right axillary  vein. Patchy multifocal interstitial and airspace disease is again noted throughout the  lungs bilaterally, most confluent in the left base and throughout the entire right lung. Overall, aeration has slightly improved in the left base compared to yesterday's examination. Decreasing small left pleural effusion. No evidence of pulmonary edema. Heart size is upper limits of normal. Mediastinal contours are within normal limits. Atherosclerosis in the thoracic aorta. Markedly low lung volumes.  IMPRESSION: 1. Malpositioned right-sided porta cath again noted, with tip directed in the right axillary vein. 2. Low lung volumes with persistent multifocal interstitial and airspace disease in the lungs bilaterally compatible with multilobar pneumonia. Aeration has slightly improved in the left lower lobe. Decreasing small left pleural effusion. 3. Atherosclerosis.   Electronically Signed   By: Trudie Reed M.D.   On: 12/15/2013 09:24   Dg Chest Port 1 View  12/14/2013   CLINICAL DATA:  Pneumonia.  Central line.  EXAM: PORTABLE CHEST - 1 VIEW  COMPARISON:  12/13/2013  FINDINGS: Right jugular Central venous catheter extends into the right axillary vein and is unchanged. No pneumothorax  Hypoventilation with decreased lung volume and bibasilar atelectasis.  Diffuse bilateral airspace disease may represent pneumonia. This also is unchanged.  Left lower lobe consolidation is slightly more prominent may be due to infiltrate and effusion.  IMPRESSION: Right jugular catheter tip extends into the right axillary vein unchanged  Diffuse bilateral airspace disease may represent ARDS or pneumonia.  Increase in left lower lobe opacity.   Electronically Signed   By: Marlan Palau M.D.   On: 12/14/2013 07:22   Dg Chest Port 1 View  12/13/2013   CLINICAL DATA:  Pneumonia.  EXAM: PORTABLE CHEST - 1 VIEW  COMPARISON:  Chest x-ray from yesterday  FINDINGS: Unchanged patchy bilateral airspace disease, most notable in the peripheral right chest. Unchanged malpositioning of the right IJ catheter, directed towards the right  axilla.  Hypoaeration. No effusion or pneumothorax visible. Unchanged cardiomegaly.  IMPRESSION: 1. Persistent malpositioning of the right IJ catheter, tip directed to the axilla. 2. Unchanged patchy airspace disease compatible with history of pneumonia.   Electronically Signed   By: Tiburcio Pea M.D.   On: 12/13/2013 06:14   Dg Chest Port 1 View  12/12/2013   CLINICAL DATA:  Central line placement.  EXAM: PORTABLE CHEST - 1 VIEW  COMPARISON:  12/12/2013 10:17 a.m.  FINDINGS: Right central line is malpositioned directed laterally with the tip overlying the right axilla. This needs to be repositioned. No gross pneumothorax. Patient's chin overlies the lung apices limiting evaluation for detection of tiny pneumothorax.  Asymmetric airspace disease greater on right minimally changed since prior exam. This may represent asymmetric pulmonary edema versus infectious infiltrate. Clinical correlation recommended.  Mild cardiomegaly.  Calcified mildly tortuous aorta.  IMPRESSION: Right central line is malpositioned directed laterally with the tip overlying the right axilla. This needs to be repositioned. No gross pneumothorax. Patient's chin overlies the lung apices limiting evaluation for detection of tiny pneumothorax.  Asymmetric airspace disease greater on right minimally changed since prior exam. This may represent asymmetric pulmonary edema versus infectious infiltrate. Clinical correlation recommended.  These results were called by telephone at the time of interpretation on 12/12/2013 at 5:18 PM to Amy, patient's nurse, who verbally acknowledged these results.   Electronically Signed   By: Bridgett Larsson M.D.   On: 12/12/2013 17:21   Dg Chest Port 1 View  12/12/2013   CLINICAL DATA:  Fever and shortness of breath  EXAM: PORTABLE  CHEST - 1 VIEW  COMPARISON:  DG CHEST 2V dated 08/24/2012; DG CHEST 1V PORT dated 08/11/2012; CT CHEST W/O CM dated 11/20/2011; DG CHEST 2 VIEW dated 04/07/2012  FINDINGS: The examination is  degraded due to portable technique and kyphotic projection.  Grossly unchanged enlarged cardiac silhouette and mediastinal contours. Worsening coarse interstitial opacities within the right mid lung. Grossly unchanged bilateral perihilar and left basilar opacities, likely atelectasis or scar. No definite evidence of edema. No definite pneumothorax, though note, evaluation degraded secondary to patient's overlying chin. Grossly unchanged bones.  IMPRESSION: Degraded examination and demonstrates development of interstitial opacities within the right mid lung with differential considerations including early asymmetric pulmonary edema and (favored) developing atypical infection/aspiration.   Electronically Signed   By: Simonne Come M.D.   On: 12/12/2013 10:31    Microbiology: Recent Results (from the past 240 hour(s))  URINE CULTURE     Status: None   Collection Time    12/12/13  9:58 AM      Result Value Ref Range Status   Specimen Description URINE, CATHETERIZED   Final   Special Requests NONE   Final   Culture  Setup Time     Final   Value: 12/12/2013 19:18     Performed at [facility] Count     Final   Value: NO GROWTH     Performed at [facility]   Culture     Final   Value: NO GROWTH     Performed at [facility]   Report Status 12/13/2013 FINAL   Final  CULTURE, BLOOD (ROUTINE X 2)     Status: None   Collection Time    12/12/13 10:00 AM      Result Value Ref Range Status   Specimen Description BLOOD BLOOD RIGHT FOREARM   Final   Special Requests BOTTLES DRAWN AEROBIC AND ANAEROBIC 5CC EACH   Final   Culture  Setup Time     Final   Value: 12/12/2013 17:17     Performed at [facility]   Culture     Final   Value:        BLOOD CULTURE RECEIVED NO GROWTH TO DATE CULTURE WILL BE HELD FOR 5 DAYS BEFORE ISSUING A FINAL NEGATIVE REPORT     Performed at [facility]   Report Status PENDING   Incomplete  CULTURE, BLOOD (ROUTINE X 2)      Status: None   Collection Time    12/12/13 11:05 AM      Result Value Ref Range Status   Specimen Description BLOOD BLOOD LEFT FOREARM   Final   Special Requests BOTTLES DRAWN AEROBIC AND ANAEROBIC 5CC EACH   Final   Culture  Setup Time     Final   Value: 12/12/2013 17:17     Performed at [facility]   Culture     Final   Value:        BLOOD CULTURE RECEIVED NO GROWTH TO DATE CULTURE WILL BE HELD FOR 5 DAYS BEFORE ISSUING A FINAL NEGATIVE REPORT     Performed at [facility]   Report Status PENDING   Incomplete  MRSA PCR SCREENING     Status: None   Collection Time    12/12/13  3:33 PM      Result Value Ref Range Status   MRSA by PCR NEGATIVE  NEGATIVE Final   Comment:  The GeneXpert MRSA Assay (FDA     approved for NASAL specimens     only), is one component of a     comprehensive MRSA colonization     surveillance program. It is not     intended to diagnose MRSA     infection nor to guide or     monitor treatment for     MRSA infections.     Labs: Basic Metabolic Panel:  Recent Labs Lab 12/13/13 0400 12/14/13 0425 12/15/13 0315 12/16/13 0425 12/17/13 0443  NA 143 145 143 143 143  K 3.2* 3.8 4.1 4.3 4.3  CL 107 110 109 107 106  CO2 24 26 28 26 28   GLUCOSE 103* 103* 92 197* 127*  BUN 22 13 10 16 15   CREATININE 0.69 0.63 0.53 0.48* 0.45*  CALCIUM 8.0* 8.2* 8.1* 8.3* 8.4  MG 1.7 2.1 1.9 1.8 1.8  PHOS  --  2.6  --   --   --    Liver Function Tests:  Recent Labs Lab 12/12/13 1000  AST 18  ALT 16  ALKPHOS 92  BILITOT 0.9  PROT 7.2  ALBUMIN 3.5    Recent Labs Lab 12/12/13 1000  LIPASE 17   No results found for this basename: AMMONIA,  in the last 168 hours CBC:  Recent Labs Lab 12/12/13 1000  12/15/13 2000 12/16/13 0425 12/16/13 1112 12/16/13 1939 12/17/13 0443  WBC 9.6  < > 4.4 4.4 4.6 5.7 9.8  NEUTROABS 7.9*  --   --   --   --   --   --   HGB 9.1*  < > 8.5* 9.0* 8.7* 8.8* 9.3*  HCT 26.5*  < > 24.8* 26.1* 24.3*  26.4* 27.4*  MCV 116.2*  < > 105.1* 105.2* 104.3* 105.2* 106.2*  PLT 542*  < > 364 368 353 407* 443*  < > = values in this interval not displayed. Cardiac Enzymes:  Recent Labs Lab 12/12/13 1609 12/12/13 2139 12/13/13 0339  TROPONINI <0.30 <0.30 <0.30   BNP: BNP (last 3 results) No results found for this basename: PROBNP,  in the last 8760 hours CBG: No results found for this basename: GLUCAP,  in the last 168 hours     Signed:  Cloey Sferrazza  [facility] 12/17/2013, 12:02 PM

## 2013-12-17 NOTE — Progress Notes (Signed)
ANTIBIOTIC CONSULT NOTE - FOLLOW UP  Pharmacy Consult for Vancomycin, Zosyn Indication: pneumonia  Allergies  Allergen Reactions  . Lisinopril Other (See Comments)    Unknown, listed on [facility]    Patient Measurements: Height: 5\' 9"  (175.3 cm) Weight: 142 lb 10.2 oz (64.7 kg) IBW/kg (Calculated) : 70.7   Vital Signs: Temp: 97.6 F (36.4 C) (03/06 0445) Temp src: Oral (03/06 0445) BP: 130/66 mmHg (03/06 0445) Pulse Rate: 58 (03/06 0445) Intake/Output from previous day: 03/05 0701 - 03/06 0700 In: 747.7 [I.V.:347.7; IV Piggyback:400] Out: 1052 [Urine:1050; Stool:2]  Labs:  Recent Labs  12/15/13 0315  12/16/13 0425 12/16/13 1112 12/16/13 1939 12/17/13 0443  WBC 5.3  < > 4.4 4.6 5.7 9.8  HGB 6.5*  < > 9.0* 8.7* 8.8* 9.3*  PLT 391  < > 368 353 407* 443*  CREATININE 0.53  --  0.48*  --   --  0.45*  < > = values in this interval not displayed. Estimated Creatinine Clearance: 57.3 ml/min (by C-G formula based on Cr of 0.45).  Recent Labs  12/15/13 1149  VANCOTROUGH 10.6     Microbiology: Recent Results (from the past 720 hour(s))  URINE CULTURE     Status: None   Collection Time    12/12/13  9:58 AM      Result Value Ref Range Status   Specimen Description URINE, CATHETERIZED   Final   Special Requests NONE   Final   Culture  Setup Time     Final   Value: 12/12/2013 19:18     Performed at [facility] Count     Final   Value: NO GROWTH     Performed at [facility]   Culture     Final   Value: NO GROWTH     Performed at [facility]   Report Status 12/13/2013 FINAL   Final  CULTURE, BLOOD (ROUTINE X 2)     Status: None   Collection Time    12/12/13 10:00 AM      Result Value Ref Range Status   Specimen Description BLOOD BLOOD RIGHT FOREARM   Final   Special Requests BOTTLES DRAWN AEROBIC AND ANAEROBIC 5CC EACH   Final   Culture  Setup Time     Final   Value: 12/12/2013 17:17     Performed at [facility]   Culture     Final   Value:        BLOOD CULTURE RECEIVED NO GROWTH TO DATE CULTURE WILL BE HELD FOR 5 DAYS BEFORE ISSUING A FINAL NEGATIVE REPORT     Performed at [facility]   Report Status PENDING   Incomplete  CULTURE, BLOOD (ROUTINE X 2)     Status: None   Collection Time    12/12/13 11:05 AM      Result Value Ref Range Status   Specimen Description BLOOD BLOOD LEFT FOREARM   Final   Special Requests BOTTLES DRAWN AEROBIC AND ANAEROBIC 5CC EACH   Final   Culture  Setup Time     Final   Value: 12/12/2013 17:17     Performed at [facility]   Culture     Final   Value:        BLOOD CULTURE RECEIVED NO GROWTH TO DATE CULTURE WILL BE HELD FOR 5 DAYS BEFORE ISSUING A FINAL NEGATIVE REPORT     Performed at [facility]   Report Status PENDING  Incomplete  MRSA PCR SCREENING     Status: None   Collection Time    12/12/13  3:33 PM      Result Value Ref Range Status   MRSA by PCR NEGATIVE  NEGATIVE Final   Comment:            The GeneXpert MRSA Assay (FDA     approved for NASAL specimens     only), is one component of a     comprehensive MRSA colonization     surveillance program. It is not     intended to diagnose MRSA     infection nor to guide or     monitor treatment for     MRSA infections.    Anti-infectives: 3/1 >> Vanc >>  3/1 >> Zosyn x 1  3/1 >> Tamiflu >> 3/2   Assessment: [age] yoM from [facility] was admitted 3/1 with c/o fever, nausea and vomiting. CXR with interstitial opacities in the R mid lung - differential includes early asymmetric pulmonary edema and favoring developing atypical infection/aspiration.  Pharmacy is consulted to dose vancomycin and Zosyn.  Day # 6 Vancomycin and Zosyn  Tmax: Afebrile  WBCs: wnl  Renal: Scr 0.45, CrCl ~ 57 ml/min  Cultures negative to date.  First vancomycin trough level (10.6 on 3/4) was below goal range and dose was increased.  Recheck trough level tonight on increased dose.  Goal of  Therapy:  Vancomycin trough level 15-20 mcg/ml Appropriate abx dosing, eradication of infection.  Plan:   Continue Zosyn 3.375g IV Q8H infused over 4hrs.  Continue Vancomycin 750mg  IV q12h.  Measure Vanc trough at steady state - tonight before 2000 dose.  Follow up renal fxn and culture results.   Lynann Beaverhristine Paulo Keimig PharmD, BCPS Pager [phone] 12/17/2013 12:11 PM

## 2013-12-17 NOTE — Progress Notes (Signed)
[facility] with [facility], asked for HHRN/PT/ST orders to be resumed.

## 2013-12-17 NOTE — Progress Notes (Signed)
Patient is set to discharge back to [facility] today. CSW faxed discharge summary & FL2 with medications, awaiting call back from [place] [facility] with ok for him to return. Discharge packet given to RN, Toniann FailWendy. [facility] will be called for transport.   Lincoln MaxinKelly Noelle Sease, LCSW [facility] Clinical Social Worker cell #: [phone]

## 2013-12-18 LAB — CULTURE, BLOOD (ROUTINE X 2)
CULTURE: NO GROWTH
Culture: NO GROWTH

## 2014-01-06 ENCOUNTER — Emergency Department (HOSPITAL_COMMUNITY)
Admission: EM | Admit: 2014-01-06 | Discharge: 2014-01-07 | Disposition: A | Discharge status: Home / Self Care | Payer: Medicare Other | Attending: Emergency Medicine | Admitting: Emergency Medicine

## 2014-01-06 DIAGNOSIS — B3789 Other sites of candidiasis: Secondary | ICD-10-CM

## 2014-01-06 DIAGNOSIS — Z8701 Personal history of pneumonia (recurrent): Secondary | ICD-10-CM | POA: Insufficient documentation

## 2014-01-06 DIAGNOSIS — I10 Essential (primary) hypertension: Secondary | ICD-10-CM | POA: Insufficient documentation

## 2014-01-06 DIAGNOSIS — M549 Dorsalgia, unspecified: Secondary | ICD-10-CM | POA: Insufficient documentation

## 2014-01-06 DIAGNOSIS — G8929 Other chronic pain: Secondary | ICD-10-CM | POA: Insufficient documentation

## 2014-01-06 DIAGNOSIS — J449 Chronic obstructive pulmonary disease, unspecified: Secondary | ICD-10-CM | POA: Insufficient documentation

## 2014-01-06 DIAGNOSIS — Z8619 Personal history of other infectious and parasitic diseases: Secondary | ICD-10-CM | POA: Insufficient documentation

## 2014-01-06 DIAGNOSIS — Z87891 Personal history of nicotine dependence: Secondary | ICD-10-CM | POA: Insufficient documentation

## 2014-01-06 DIAGNOSIS — L988 Other specified disorders of the skin and subcutaneous tissue: Secondary | ICD-10-CM

## 2014-01-06 DIAGNOSIS — I4891 Unspecified atrial fibrillation: Secondary | ICD-10-CM | POA: Insufficient documentation

## 2014-01-06 DIAGNOSIS — B3749 Other urogenital candidiasis: Secondary | ICD-10-CM | POA: Insufficient documentation

## 2014-01-06 DIAGNOSIS — Z79899 Other long term (current) drug therapy: Secondary | ICD-10-CM | POA: Insufficient documentation

## 2014-01-06 DIAGNOSIS — Z792 Long term (current) use of antibiotics: Secondary | ICD-10-CM | POA: Insufficient documentation

## 2014-01-06 DIAGNOSIS — Z8719 Personal history of other diseases of the digestive system: Secondary | ICD-10-CM | POA: Insufficient documentation

## 2014-01-06 DIAGNOSIS — Z7982 Long term (current) use of aspirin: Secondary | ICD-10-CM | POA: Insufficient documentation

## 2014-01-06 DIAGNOSIS — J4489 Other specified chronic obstructive pulmonary disease: Secondary | ICD-10-CM | POA: Insufficient documentation

## 2014-01-06 LAB — CBC WITH DIFFERENTIAL/PLATELET
BASOS ABS: 0.1 10*3/uL (ref 0.0–0.1)
Basophils Relative: 1 % (ref 0–1)
EOS ABS: 0.1 10*3/uL (ref 0.0–0.7)
Eosinophils Relative: 1 % (ref 0–5)
HCT: 29.3 % — ABNORMAL LOW (ref 39.0–52.0)
HEMOGLOBIN: 9.7 g/dL — AB (ref 13.0–17.0)
LYMPHS PCT: 38 % (ref 12–46)
Lymphs Abs: 2.7 10*3/uL (ref 0.7–4.0)
MCH: 36.5 pg — ABNORMAL HIGH (ref 26.0–34.0)
MCHC: 33.1 g/dL (ref 30.0–36.0)
MCV: 110.2 fL — ABNORMAL HIGH (ref 78.0–100.0)
MONOS PCT: 8 % (ref 3–12)
Monocytes Absolute: 0.6 10*3/uL (ref 0.1–1.0)
NEUTROS ABS: 3.5 10*3/uL (ref 1.7–7.7)
NEUTROS PCT: 52 % (ref 43–77)
Platelets: 523 10*3/uL — ABNORMAL HIGH (ref 150–400)
RBC: 2.66 MIL/uL — ABNORMAL LOW (ref 4.22–5.81)
RDW: 20.3 % — AB (ref 11.5–15.5)
WBC MORPHOLOGY: INCREASED
WBC: 7 10*3/uL (ref 4.0–10.5)

## 2014-01-06 LAB — URINALYSIS, ROUTINE W REFLEX MICROSCOPIC
BILIRUBIN URINE: NEGATIVE
GLUCOSE, UA: NEGATIVE mg/dL
KETONES UR: NEGATIVE mg/dL
NITRITE: NEGATIVE
Protein, ur: 30 mg/dL — AB
SPECIFIC GRAVITY, URINE: 1.018 (ref 1.005–1.030)
Urobilinogen, UA: 1 mg/dL (ref 0.0–1.0)
pH: 8.5 — ABNORMAL HIGH (ref 5.0–8.0)

## 2014-01-06 LAB — BASIC METABOLIC PANEL
BUN: 27 mg/dL — ABNORMAL HIGH (ref 6–23)
CALCIUM: 9.4 mg/dL (ref 8.4–10.5)
CO2: 32 mEq/L (ref 19–32)
Chloride: 102 mEq/L (ref 96–112)
Creatinine, Ser: 0.53 mg/dL (ref 0.50–1.35)
GFR calc non Af Amer: >90 mL/min (ref >90)
GLUCOSE: 101 mg/dL — AB (ref 70–99)
POTASSIUM: 4.4 meq/L (ref 3.7–5.3)
SODIUM: 144 meq/L (ref 137–147)

## 2014-01-06 LAB — URINE MICROSCOPIC-ADD ON

## 2014-01-06 MED ORDER — NYSTATIN 100000 UNIT/GM EX CREA: TOPICAL_CREAM | CUTANEOUS | Status: DC

## 2014-01-06 NOTE — Discharge Instructions (Signed)
Candida Infection, Adult A candida infection (also called yeast, fungus and Monilia infection) is an overgrowth of yeast that can occur anywhere on the body. A yeast infection commonly occurs in warm, moist body areas. Usually, the infection remains localized but can spread to become a systemic infection. A yeast infection may be a sign of a more severe disease such as diabetes, leukemia, or AIDS. A yeast infection can occur in both men and women. In women, Candida vaginitis is a vaginal infection. It is one of the most common causes of vaginitis. Men usually do not have symptoms or know they have an infection until other problems develop. Men may find out they have a yeast infection because their sex partner has a yeast infection. Uncircumcised men are more likely to get a yeast infection than circumcised men. This is because the uncircumcised glans is not exposed to air and does not remain as dry as that of a circumcised glans. Older adults may develop yeast infections around dentures. CAUSES  Women  Antibiotics.  Steroid medication taken for a long time.  Being overweight (obese).  Diabetes.  Poor immune condition.  Certain serious medical conditions.  Immune suppressive medications for organ transplant patients.  Chemotherapy.  Pregnancy.  Menstration.  Stress and fatigue.  Intravenous drug use.  Oral contraceptives.  Wearing tight-fitting clothes in the crotch area.  Catching it from a sex partner who has a yeast infection.  Spermicide.  Intravenous, urinary, or other catheters. Men  Catching it from a sex partner who has a yeast infection.  Having oral or anal sex with a person who has the infection.  Spermicide.  Diabetes.  Antibiotics.  Poor immune system.  Medications that suppress the immune system.  Intravenous drug use.  Intravenous, urinary, or other catheters. SYMPTOMS  Women  Thick, white vaginal discharge.  Vaginal itching.  Redness and  swelling in and around the vagina.  Irritation of the lips of the vagina and perineum.  Blisters on the vaginal lips and perineum.  Painful sexual intercourse.  Low blood sugar (hypoglycemia).  Painful urination.  Bladder infections.  Intestinal problems such as constipation, indigestion, bad breath, bloating, increase in gas, diarrhea, or loose stools. Men  Men may develop intestinal problems such as constipation, indigestion, bad breath, bloating, increase in gas, diarrhea, or loose stools.  Dry, cracked skin on the penis with itching or discomfort.  Jock itch.  Dry, flaky skin.  Athlete's foot.  Hypoglycemia. DIAGNOSIS  Women  A history and an exam are performed.  The discharge may be examined under a microscope.  A culture may be taken of the discharge. Men  A history and an exam are performed.  Any discharge from the penis or areas of cracked skin will be looked at under the microscope and cultured.  Stool samples may be cultured. TREATMENT  Women  Vaginal antifungal suppositories and creams.  Medicated creams to decrease irritation and itching on the outside of the vagina.  Warm compresses to the perineal area to decrease swelling and discomfort.  Oral antifungal medications.  Medicated vaginal suppositories or cream for repeated or recurrent infections.  Wash and dry the irritation areas before applying the cream.  Eating yogurt with lactobacillus may help with prevention and treatment.  Sometimes painting the vagina with gentian violet solution may help if creams and suppositories do not work. Men  Antifungal creams and oral antifungal medications.  Sometimes treatment must continue for 30 days after the symptoms go away to prevent recurrence. HOME CARE   INSTRUCTIONS  Women  Use cotton underwear and avoid tight-fitting clothing.  Avoid colored, scented toilet paper and deodorant tampons or pads.  Do not douche.  Keep your diabetes  under control.  Finish all the prescribed medications.  Keep your skin clean and dry.  Consume milk or yogurt with lactobacillus active culture regularly. If you get frequent yeast infections and think that is what the infection is, there are over-the-counter medications that you can get. If the infection does not show healing in 3 days, talk to your caregiver.  Tell your sex partner you have a yeast infection. Your partner may need treatment also, especially if your infection does not clear up or recurs. Men  Keep your skin clean and dry.  Keep your diabetes under control.  Finish all prescribed medications.  Tell your sex partner that you have a yeast infection so they can be treated if necessary. SEEK MEDICAL CARE IF:   Your symptoms do not clear up or worsen in one week after treatment.  You have an oral temperature above 102 F (38.9 C).  You have trouble swallowing or eating for a prolonged time.  You develop blisters on and around your vagina.  You develop vaginal bleeding and it is not your menstrual period.  You develop abdominal pain.  You develop intestinal problems as mentioned above.  You get weak or lightheaded.  You have painful or increased urination.  You have pain during sexual intercourse. MAKE SURE YOU:   Understand these instructions.  Will watch your condition.  Will get help right away if you are not doing well or get worse. Document Released: 11/07/2004 Document Revised: 12/23/2011 Document Reviewed: 02/19/2010 [facility] Patient Information 2014 [place], [place].   The patient was found to have maceration and likely a candidal skin infection of his groin area including his penis and testicles. This area needs to be kept dry with the exception of applying nystatin cream twice daily to treat the candidal infection. I suspect the blood that was seen at the urethral meatus is due to mild skin breakdown seen on the penis and testicles. I would like  him to followup with his doctor next week to evaluate him for possible removal of the Foley catheter. We removed the Foley catheter here in the ER and gave him some time to see if he could void on his own but he was unable to do so. We replaced the catheter and collected urine from the new catheter. He did not have evidence of a UTI.

## 2014-01-06 NOTE — ED Notes (Signed)
Bladder scanner showed highest reading of 170 mL

## 2014-01-06 NOTE — ED Notes (Signed)
Pt presents from [facility] with c/o blood in his catheter bag. EMS also reports that facility said he has blood around his penis area as well. Pt is non-ambulatory and has had the catheter in place for about a week. Per EMS, pt says he has pain in the penis and groin area.

## 2014-01-06 NOTE — ED Provider Notes (Signed)
CSN: [id number]     Arrival date & time 01/06/14  1951 History   First MD Initiated Contact with Patient 01/06/14 1957     Chief Complaint  Patient presents with  . Hematuria     (Consider location/radiation/quality/duration/timing/severity/associated sxs/prior Treatment) Patient is a 80 y.o. male presenting with male genitourinary complaint. The history is provided by the patient.  Male GU Problem Presenting symptoms: penile pain   Presenting symptoms: no dysuria   Presenting symptoms comment:  Blood at the urethral meatus  Context comment:  Indwelling foley Relieved by:  Nothing Worsened by:  Nothing tried Ineffective treatments:  None tried Associated symptoms: penile redness   Associated symptoms: no abdominal pain, no diarrhea, no fever, no hematuria, no nausea and no vomiting     Past Medical History  Diagnosis Date  . Atrial fibrillation with slow ventricular response     Symptoms of weakness  . Paroxysmal atrial fibrillation     Hx of, currently on amiodarone load  . Syncope     Probably related to sinus pause, finally conversion from atrial fibrillation to sinus rhythm  . COPD (chronic obstructive pulmonary disease)   . Hypertension   . Abnormal CXR 07/06/2007; 06/15/2010    Eleveted R HD 07/06/2007 > no change 09/92/2011  . Chronic back pain   . 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 Joint replacement      right knee  . Appendectomy     Family History  Problem Relation Age of Onset  . Cancer Maternal Grandmother   . Alzheimer's disease Mother   . COPD Father     possible CHF   History  Substance Use Topics  . Smoking status: Former Smoker -- 3.00 packs/day for 10 years    Quit date: 10/14/1953  . Smokeless tobacco: Not on file  . Alcohol Use: 3.5 oz/week    7 drink(s) per week     Comment: none in two years    Review of Systems  Constitutional: Negative for fever.   HENT: Negative for drooling and rhinorrhea.   Eyes: Negative for pain.  Respiratory: Negative for cough and shortness of breath.   Cardiovascular: Negative for chest pain and leg swelling.  Gastrointestinal: Negative for nausea, vomiting, abdominal pain and diarrhea.  Genitourinary: Positive for penile pain. Negative for dysuria and hematuria.  Musculoskeletal: Positive for back pain (mild, unchanged from baseline). Negative for gait problem and neck pain.  Skin: Negative for color change.  Neurological: Negative for numbness and headaches.  Hematological: Negative for adenopathy.  Psychiatric/Behavioral: Negative for behavioral problems.  All other systems reviewed and are negative.      Allergies  Lisinopril  Home Medications   Current Outpatient Rx  Name  Route  Sig  Dispense  Refill  . albuterol (PROVENTIL) (2.5 MG/3ML) 0.083% nebulizer solution   Nebulization   Take 2.5 mg by nebulization every 4 (four) hours as needed for wheezing or shortness of breath.         Marland Kitchen aspirin 81 MG tablet   Oral   Take 81 mg by mouth daily.         . Calcium Carb-Cholecalciferol (CALCIUM + D3) 600-200 MG-UNIT TABS   Oral   Take 1 tablet by mouth daily.         . Cholecalciferol (VITAMIN D-3) 1000 UNITS CAPS   Oral   Take by mouth. Take once daily         .  divalproex (DEPAKOTE SPRINKLE) 125 MG capsule      125 mg 2 (two) times daily.          Marland Kitchen. docusate sodium 100 MG CAPS   Oral   Take 100 mg by mouth 2 (two) times daily.   10 capsule   0   . dutasteride (AVODART) 0.5 MG capsule   Oral   Take 0.5 mg by mouth daily.         . feeding supplement, ENSURE COMPLETE, (ENSURE COMPLETE) LIQD   Oral   Take 237 mLs by mouth 3 (three) times daily between meals.         . ferrous sulfate 325 (65 FE) MG tablet   Oral   Take 325 mg by mouth 2 (two) times daily with a meal.         . FLOVENT HFA 110 MCG/ACT inhaler   Inhalation   Inhale 1 puff into the lungs 2  (two) times daily.          . furosemide (LASIX) 40 MG tablet   Oral   Take 40 mg by mouth daily.         Marland Kitchen. HYDROcodone-acetaminophen (NORCO/VICODIN) 5-325 MG per tablet   Oral   Take 1 tablet by mouth 2 (two) times daily as needed for moderate pain.         Marland Kitchen. ipratropium (ATROVENT) 0.02 % nebulizer solution   Nebulization   Take 0.5 mg by nebulization every 4 (four) hours as needed for wheezing or shortness of breath.         . mirtazapine (REMERON) 15 MG tablet   Oral   Take 15 mg by mouth at bedtime.         Marland Kitchen. omeprazole (PRILOSEC) 20 MG capsule   Oral   Take 20 mg by mouth daily.         . polyethylene glycol (MIRALAX / GLYCOLAX) packet   Oral   Take 17 g by mouth daily as needed for mild constipation.          . sertraline (ZOLOFT) 50 MG tablet   Oral   Take 50 mg by mouth daily.          . sodium chloride (OCEAN) 0.65 % nasal spray   Nasal   Place 2 sprays into the nose 3 (three) times daily as needed. For dryness.         . Tamsulosin HCl (FLOMAX) 0.4 MG CAPS   Oral   Take 0.4 mg by mouth daily.         . temazepam (RESTORIL) 30 MG capsule   Oral   Take 1 capsule (30 mg total) by mouth at bedtime.   30 capsule   0   . donepezil (ARICEPT) 5 MG tablet   Oral   Take 5 mg by mouth at bedtime.          Marland Kitchen. ipratropium-albuterol (DUONEB) 0.5-2.5 (3) MG/3ML SOLN   Nebulization   Take 3 mLs by nebulization every 4 (four) hours as needed.   360 mL      . levofloxacin (LEVAQUIN) 750 MG tablet   Oral   Take 1 tablet (750 mg total) by mouth daily.   5 tablet   0   . Melatonin 5 MG TABS   Oral   Take 5 mg by mouth daily.         . mineral oil liquid      Place 2 drops into each ear once  a week.         . senna (SENOKOT) 8.6 MG TABS tablet   Oral   Take 2 tablets (17.2 mg total) by mouth at bedtime as needed for mild constipation.   120 each   0   . vitamin B-12 (CYANOCOBALAMIN) 1000 MCG tablet   Oral   Take 1 tablet (1,000  mcg total) by mouth daily.          SpO2 91% Physical Exam  Nursing note and vitals reviewed. Constitutional: He is oriented to person, place, and time. He appears well-developed and well-nourished.  HENT:  Head: Normocephalic and atraumatic.  Right Ear: External ear normal.  Left Ear: External ear normal.  Nose: Nose normal.  Mouth/Throat: Oropharynx is clear and moist. No oropharyngeal exudate.  Eyes: Conjunctivae and EOM are normal. Pupils are equal, round, and reactive to light.  Neck: Normal range of motion. Neck supple.  Cardiovascular: Normal rate, regular rhythm, normal heart sounds and intact distal pulses.  Exam reveals no gallop and no friction rub.   No murmur heard. Pulmonary/Chest: Effort normal and breath sounds normal. No respiratory distress. He has no wheezes.  Abdominal: Soft. Bowel sounds are normal. He exhibits no distension. There is no tenderness. There is no rebound and no guarding.  Genitourinary:  Mild maceration of the penis and glans. Mild maceration of the anterior scrotum. Mild dried blood noted at the urethral meatus.  White cheese like substance noted on the anterior scrotum where the shaft of the penis overlies.   No inguinal adenopathy or hernias noted.   No testicular tenderness to palpation.    Musculoskeletal: Normal range of motion. He exhibits no edema and no tenderness.  Neurological: He is alert and oriented to person, place, and time.  Skin: Skin is warm and dry.  Psychiatric: He has a normal mood and affect. His behavior is normal.    ED Course  Procedures (including critical care time) Labs Review Labs Reviewed  CBC WITH DIFFERENTIAL - Abnormal; Notable for the following:    RBC 2.66 (*)    Hemoglobin 9.7 (*)    HCT 29.3 (*)    MCV 110.2 (*)    MCH 36.5 (*)    RDW 20.3 (*)    Platelets 523 (*)    All other components within normal limits  BASIC METABOLIC PANEL - Abnormal; Notable for the following:    Glucose, Bld 101 (*)     BUN 27 (*)    All other components within normal limits  URINALYSIS, ROUTINE W REFLEX MICROSCOPIC - Abnormal; Notable for the following:    pH 8.5 (*)    Hgb urine dipstick MODERATE (*)    Protein, ur 30 (*)    Leukocytes, UA SMALL (*)    All other components within normal limits  URINE CULTURE  URINE MICROSCOPIC-ADD ON   Imaging Review No results found.   EKG Interpretation None      MDM   Final diagnoses:  Candida rash of groin  Skin maceration    8:32 PM 80 y.o. male status post recent discharge on the first of this month after he was admitted for septic shock associated with aspiration pneumonia who presents today with some blood around his urethral meatus. In review of the notes from his last admission it appears that he had acute urinary retention without UTI noted. I suspect a Foley was left in place and has not been changed since that time. He had some mild bleeding around the  urethral meatus and was sent here for evaluation of this. On exam he has maceration of the penis and testicles. He denies any testicular pain but does have some mild pain at the urethral meatus where the Foley is. There is no evidence of frank blood in his urine on visual exam. He is afebrile and vital signs are unremarkable here. Will get screening labwork. Will remove Foley and see if patient can urinate on his own. If not Foley will be replaced. I suspect the bleeding to be associated with the maceration and skin irritation on the penis.  11:56 PM: Pt continues to appear well. Labs non-contrib. Foley was removed but pt was unable to void, bladder scan showed 170cc. A new foley was placed and urine drawn from it. UA showed small leuks, rare bact. Doubt UTI. Some blood in UA likely assoc w/ foley change. Will tx w/ nystatin cream for likely cutaneous candidal infection. Will rec keeping area dry and close f/u w/ pcp next week. I have discussed the diagnosis/risks/treatment options with the patient and  believe the pt to be eligible for discharge home to follow-up with pcp next week. We also discussed returning to the ED immediately if new or worsening sx occur. We discussed the sx which are most concerning (e.g., fever, abd pain, worsening hematuria) that necessitate immediate return. Medications administered to the patient during their visit and any new prescriptions provided to the patient are listed below.  Medications given during this visit Medications - No data to display  New Prescriptions   NYSTATIN CREAM (MYCOSTATIN)    Apply to penis/testicles and groin area 2 times daily.       Junius Argyle, MD 01/07/14 0000

## 2014-01-08 LAB — URINE CULTURE
CULTURE: NO GROWTH
Colony Count: NO GROWTH
Special Requests: NORMAL

## 2014-01-09 ENCOUNTER — Emergency Department (HOSPITAL_COMMUNITY): Payer: Medicare Other

## 2014-01-09 ENCOUNTER — Observation Stay (HOSPITAL_COMMUNITY)
Admission: EM | Admit: 2014-01-09 | Discharge: 2014-01-10 | Disposition: A | Discharge status: Nursing Home (Includes ICF) | Payer: Medicare Other | Attending: Internal Medicine | Admitting: Internal Medicine

## 2014-01-09 ENCOUNTER — Encounter (HOSPITAL_COMMUNITY): Payer: Self-pay | Admitting: Emergency Medicine

## 2014-01-09 DIAGNOSIS — R339 Retention of urine, unspecified: Secondary | ICD-10-CM | POA: Insufficient documentation

## 2014-01-09 DIAGNOSIS — F03918 Unspecified dementia, unspecified severity, with other behavioral disturbance: Secondary | ICD-10-CM | POA: Diagnosis not present

## 2014-01-09 DIAGNOSIS — E43 Unspecified severe protein-calorie malnutrition: Secondary | ICD-10-CM | POA: Diagnosis present

## 2014-01-09 DIAGNOSIS — F3289 Other specified depressive episodes: Secondary | ICD-10-CM | POA: Insufficient documentation

## 2014-01-09 DIAGNOSIS — Z79899 Other long term (current) drug therapy: Secondary | ICD-10-CM | POA: Diagnosis not present

## 2014-01-09 DIAGNOSIS — F0391 Unspecified dementia with behavioral disturbance: Secondary | ICD-10-CM | POA: Diagnosis not present

## 2014-01-09 DIAGNOSIS — K5900 Constipation, unspecified: Secondary | ICD-10-CM | POA: Diagnosis not present

## 2014-01-09 DIAGNOSIS — R143 Flatulence: Secondary | ICD-10-CM

## 2014-01-09 DIAGNOSIS — Z8701 Personal history of pneumonia (recurrent): Secondary | ICD-10-CM | POA: Insufficient documentation

## 2014-01-09 DIAGNOSIS — I509 Heart failure, unspecified: Secondary | ICD-10-CM | POA: Diagnosis not present

## 2014-01-09 DIAGNOSIS — E872 Acidosis, unspecified: Secondary | ICD-10-CM | POA: Diagnosis not present

## 2014-01-09 DIAGNOSIS — N401 Enlarged prostate with lower urinary tract symptoms: Secondary | ICD-10-CM | POA: Insufficient documentation

## 2014-01-09 DIAGNOSIS — R142 Eructation: Secondary | ICD-10-CM | POA: Insufficient documentation

## 2014-01-09 DIAGNOSIS — G4700 Insomnia, unspecified: Secondary | ICD-10-CM | POA: Diagnosis not present

## 2014-01-09 DIAGNOSIS — K225 Diverticulum of esophagus, acquired: Secondary | ICD-10-CM | POA: Diagnosis not present

## 2014-01-09 DIAGNOSIS — J449 Chronic obstructive pulmonary disease, unspecified: Secondary | ICD-10-CM | POA: Insufficient documentation

## 2014-01-09 DIAGNOSIS — F32A Depression, unspecified: Secondary | ICD-10-CM | POA: Diagnosis present

## 2014-01-09 DIAGNOSIS — R109 Unspecified abdominal pain: Secondary | ICD-10-CM | POA: Insufficient documentation

## 2014-01-09 DIAGNOSIS — I4891 Unspecified atrial fibrillation: Secondary | ICD-10-CM | POA: Diagnosis not present

## 2014-01-09 DIAGNOSIS — B372 Candidiasis of skin and nail: Secondary | ICD-10-CM | POA: Insufficient documentation

## 2014-01-09 DIAGNOSIS — I959 Hypotension, unspecified: Secondary | ICD-10-CM | POA: Diagnosis not present

## 2014-01-09 DIAGNOSIS — E860 Dehydration: Secondary | ICD-10-CM | POA: Diagnosis not present

## 2014-01-09 DIAGNOSIS — R1310 Dysphagia, unspecified: Secondary | ICD-10-CM | POA: Diagnosis not present

## 2014-01-09 DIAGNOSIS — Q396 Congenital diverticulum of esophagus: Secondary | ICD-10-CM | POA: Diagnosis present

## 2014-01-09 DIAGNOSIS — Z87891 Personal history of nicotine dependence: Secondary | ICD-10-CM | POA: Insufficient documentation

## 2014-01-09 DIAGNOSIS — I5032 Chronic diastolic (congestive) heart failure: Secondary | ICD-10-CM | POA: Insufficient documentation

## 2014-01-09 DIAGNOSIS — R4702 Dysphasia: Secondary | ICD-10-CM | POA: Diagnosis present

## 2014-01-09 DIAGNOSIS — Z7982 Long term (current) use of aspirin: Secondary | ICD-10-CM | POA: Insufficient documentation

## 2014-01-09 DIAGNOSIS — Z96659 Presence of unspecified artificial knee joint: Secondary | ICD-10-CM | POA: Insufficient documentation

## 2014-01-09 DIAGNOSIS — R141 Gas pain: Secondary | ICD-10-CM | POA: Diagnosis not present

## 2014-01-09 DIAGNOSIS — F329 Major depressive disorder, single episode, unspecified: Secondary | ICD-10-CM | POA: Diagnosis not present

## 2014-01-09 DIAGNOSIS — N138 Other obstructive and reflux uropathy: Secondary | ICD-10-CM | POA: Insufficient documentation

## 2014-01-09 DIAGNOSIS — N400 Enlarged prostate without lower urinary tract symptoms: Secondary | ICD-10-CM | POA: Diagnosis present

## 2014-01-09 DIAGNOSIS — Z9089 Acquired absence of other organs: Secondary | ICD-10-CM | POA: Insufficient documentation

## 2014-01-09 DIAGNOSIS — J4489 Other specified chronic obstructive pulmonary disease: Secondary | ICD-10-CM | POA: Insufficient documentation

## 2014-01-09 DIAGNOSIS — I10 Essential (primary) hypertension: Secondary | ICD-10-CM | POA: Insufficient documentation

## 2014-01-09 DIAGNOSIS — D649 Anemia, unspecified: Secondary | ICD-10-CM

## 2014-01-09 DIAGNOSIS — D539 Nutritional anemia, unspecified: Secondary | ICD-10-CM | POA: Diagnosis not present

## 2014-01-09 DIAGNOSIS — B379 Candidiasis, unspecified: Secondary | ICD-10-CM | POA: Diagnosis present

## 2014-01-09 HISTORY — DX: Benign prostatic hyperplasia without lower urinary tract symptoms: N40.0

## 2014-01-09 LAB — CBC WITH DIFFERENTIAL/PLATELET
Basophils Absolute: 0.1 10*3/uL (ref 0.0–0.1)
Basophils Relative: 1 % (ref 0–1)
EOS PCT: 1 % (ref 0–5)
Eosinophils Absolute: 0.1 10*3/uL (ref 0.0–0.7)
HEMATOCRIT: 29.8 % — AB (ref 39.0–52.0)
Hemoglobin: 9.9 g/dL — ABNORMAL LOW (ref 13.0–17.0)
LYMPHS PCT: 27 % (ref 12–46)
Lymphs Abs: 2.3 10*3/uL (ref 0.7–4.0)
MCH: 36.4 pg — ABNORMAL HIGH (ref 26.0–34.0)
MCHC: 33.2 g/dL (ref 30.0–36.0)
MCV: 109.6 fL — AB (ref 78.0–100.0)
MONO ABS: 0.9 10*3/uL (ref 0.1–1.0)
MONOS PCT: 10 % (ref 3–12)
Neutro Abs: 5.2 10*3/uL (ref 1.7–7.7)
Neutrophils Relative %: 62 % (ref 43–77)
Platelets: 506 10*3/uL — ABNORMAL HIGH (ref 150–400)
RBC: 2.72 MIL/uL — ABNORMAL LOW (ref 4.22–5.81)
RDW: 19.8 % — AB (ref 11.5–15.5)
WBC: 8.4 10*3/uL (ref 4.0–10.5)

## 2014-01-09 LAB — URINALYSIS, ROUTINE W REFLEX MICROSCOPIC
Glucose, UA: NEGATIVE mg/dL
KETONES UR: NEGATIVE mg/dL
NITRITE: NEGATIVE
Protein, ur: NEGATIVE mg/dL
Specific Gravity, Urine: 1.021 (ref 1.005–1.030)
UROBILINOGEN UA: 1 mg/dL (ref 0.0–1.0)
pH: 6 (ref 5.0–8.0)

## 2014-01-09 LAB — BASIC METABOLIC PANEL
BUN: 20 mg/dL (ref 6–23)
CALCIUM: 9.2 mg/dL (ref 8.4–10.5)
CO2: 29 meq/L (ref 19–32)
CREATININE: 0.51 mg/dL (ref 0.50–1.35)
Chloride: 102 mEq/L (ref 96–112)
GFR calc Af Amer: >90 mL/min (ref >90)
GFR calc non Af Amer: >90 mL/min (ref >90)
GLUCOSE: 116 mg/dL — AB (ref 70–99)
Potassium: 4.3 mEq/L (ref 3.7–5.3)
Sodium: 143 mEq/L (ref 137–147)

## 2014-01-09 LAB — MAGNESIUM: Magnesium: 2 mg/dL (ref 1.5–2.5)

## 2014-01-09 LAB — I-STAT CG4 LACTIC ACID, ED: LACTIC ACID, VENOUS: 3.05 mmol/L — AB (ref 0.5–2.2)

## 2014-01-09 LAB — URINE MICROSCOPIC-ADD ON

## 2014-01-09 LAB — PHOSPHORUS: PHOSPHORUS: 3.4 mg/dL (ref 2.3–4.6)

## 2014-01-09 MED ORDER — SODIUM CHLORIDE 0.9 % IV SOLN
Freq: Once | INTRAVENOUS | Status: AC
  Administered 2014-01-09: 18:00:00 via INTRAVENOUS

## 2014-01-09 MED ORDER — ALBUTEROL SULFATE (2.5 MG/3ML) 0.083% IN NEBU: 2.5000 mg | INHALATION_SOLUTION | RESPIRATORY_TRACT | Status: DC | PRN

## 2014-01-09 MED ORDER — SODIUM CHLORIDE 0.9 % IV SOLN: INTRAVENOUS | Status: AC

## 2014-01-09 MED ORDER — ENSURE COMPLETE PO LIQD
237.0000 mL | Freq: Three times a day (TID) | ORAL | Status: DC
  Administered 2014-01-09 – 2014-01-10 (×3): 237 mL via ORAL

## 2014-01-09 MED ORDER — DIVALPROEX SODIUM 125 MG PO CPSP
125.0000 mg | ORAL_CAPSULE | Freq: Two times a day (BID) | ORAL | Status: DC
  Administered 2014-01-09 – 2014-01-10 (×2): 125 mg via ORAL
  Filled 2014-01-09 (×3): qty 1

## 2014-01-09 MED ORDER — HYDROCODONE-ACETAMINOPHEN 5-325 MG PO TABS: 1.0000 | ORAL_TABLET | Freq: Two times a day (BID) | ORAL | Status: DC | PRN

## 2014-01-09 MED ORDER — ACETAMINOPHEN 325 MG PO TABS
650.0000 mg | ORAL_TABLET | Freq: Four times a day (QID) | ORAL | Status: DC | PRN
  Administered 2014-01-09: 650 mg via ORAL
  Filled 2014-01-09: qty 2

## 2014-01-09 MED ORDER — SODIUM CHLORIDE 0.9 % IV SOLN
INTRAVENOUS | Status: AC
  Administered 2014-01-09 – 2014-01-10 (×2): via INTRAVENOUS

## 2014-01-09 MED ORDER — DOCUSATE SODIUM 100 MG PO CAPS
100.0000 mg | ORAL_CAPSULE | Freq: Two times a day (BID) | ORAL | Status: DC
  Administered 2014-01-09 – 2014-01-10 (×2): 100 mg via ORAL
  Filled 2014-01-09 (×3): qty 1

## 2014-01-09 MED ORDER — ZOLPIDEM TARTRATE 5 MG PO TABS
5.0000 mg | ORAL_TABLET | Freq: Every day | ORAL | Status: DC
  Administered 2014-01-09: 5 mg via ORAL
  Filled 2014-01-09: qty 1

## 2014-01-09 MED ORDER — POLYETHYLENE GLYCOL 3350 17 G PO PACK: 17.0000 g | PACK | Freq: Every day | ORAL | Status: AC | PRN

## 2014-01-09 MED ORDER — VITAMIN B-12 1000 MCG PO TABS
1000.0000 ug | ORAL_TABLET | Freq: Every day | ORAL | Status: DC
  Administered 2014-01-10: 1000 ug via ORAL
  Filled 2014-01-09 (×3): qty 1

## 2014-01-09 MED ORDER — SERTRALINE HCL 25 MG PO TABS
25.0000 mg | ORAL_TABLET | Freq: Every day | ORAL | Status: DC
  Administered 2014-01-10: 25 mg via ORAL
  Filled 2014-01-09 (×2): qty 1

## 2014-01-09 MED ORDER — ASPIRIN 81 MG PO CHEW
81.0000 mg | CHEWABLE_TABLET | Freq: Every day | ORAL | Status: DC
  Administered 2014-01-10: 81 mg via ORAL
  Filled 2014-01-09: qty 1

## 2014-01-09 MED ORDER — FERROUS SULFATE 325 (65 FE) MG PO TABS
325.0000 mg | ORAL_TABLET | Freq: Two times a day (BID) | ORAL | Status: DC
  Administered 2014-01-10: 325 mg via ORAL
  Filled 2014-01-09 (×3): qty 1

## 2014-01-09 MED ORDER — POLYETHYLENE GLYCOL 3350 17 G PO PACK: 17.0000 g | PACK | Freq: Every day | ORAL | Status: DC

## 2014-01-09 MED ORDER — SODIUM CHLORIDE 0.9 % IV BOLUS (SEPSIS)
1000.0000 mL | Freq: Once | INTRAVENOUS | Status: AC
  Administered 2014-01-09: 1000 mL via INTRAVENOUS

## 2014-01-09 MED ORDER — FLUTICASONE PROPIONATE HFA 110 MCG/ACT IN AERO
1.0000 | INHALATION_SPRAY | Freq: Two times a day (BID) | RESPIRATORY_TRACT | Status: DC
  Administered 2014-01-09 – 2014-01-10 (×2): 1 via RESPIRATORY_TRACT
  Filled 2014-01-09: qty 12

## 2014-01-09 MED ORDER — PANTOPRAZOLE SODIUM 40 MG PO TBEC
40.0000 mg | DELAYED_RELEASE_TABLET | Freq: Every day | ORAL | Status: DC
Start: 2014-01-10 — End: 2014-01-10
  Administered 2014-01-10: 40 mg via ORAL
  Filled 2014-01-09: qty 1

## 2014-01-09 MED ORDER — DUTASTERIDE 0.5 MG PO CAPS
0.5000 mg | ORAL_CAPSULE | Freq: Every day | ORAL | Status: DC
  Administered 2014-01-10: 0.5 mg via ORAL
  Filled 2014-01-09: qty 1

## 2014-01-09 MED ORDER — FLEET ENEMA 7-19 GM/118ML RE ENEM
1.0000 | ENEMA | Freq: Once | RECTAL | Status: AC
  Administered 2014-01-09: 1 via RECTAL
  Filled 2014-01-09: qty 1

## 2014-01-09 MED ORDER — ACETAMINOPHEN 650 MG RE SUPP: 650.0000 mg | Freq: Four times a day (QID) | RECTAL | Status: DC | PRN

## 2014-01-09 MED ORDER — LIDOCAINE HCL 2 % EX GEL
CUTANEOUS | Status: AC
  Filled 2014-01-09: qty 10

## 2014-01-09 MED ORDER — POLYETHYLENE GLYCOL 3350 17 G PO PACK
17.0000 g | PACK | Freq: Every day | ORAL | Status: DC
  Administered 2014-01-09 – 2014-01-10 (×2): 17 g via ORAL
  Filled 2014-01-09 (×2): qty 1

## 2014-01-09 MED ORDER — MIRTAZAPINE 15 MG PO TABS
15.0000 mg | ORAL_TABLET | Freq: Every day | ORAL | Status: DC
  Administered 2014-01-09: 15 mg via ORAL
  Filled 2014-01-09 (×2): qty 1

## 2014-01-09 MED ORDER — LIDOCAINE HCL 2 % EX GEL
Freq: Once | CUTANEOUS | Status: AC
  Administered 2014-01-09: 12:00:00

## 2014-01-09 MED ORDER — ONDANSETRON HCL 4 MG/2ML IJ SOLN: 4.0000 mg | Freq: Four times a day (QID) | INTRAMUSCULAR | Status: DC | PRN

## 2014-01-09 MED ORDER — SALINE SPRAY 0.65 % NA SOLN: 2.0000 | Freq: Three times a day (TID) | NASAL | Status: DC | PRN

## 2014-01-09 MED ORDER — IPRATROPIUM BROMIDE 0.02 % IN SOLN: 0.5000 mg | RESPIRATORY_TRACT | Status: DC | PRN

## 2014-01-09 MED ORDER — NYSTATIN 100000 UNIT/GM EX CREA
1.0000 "application " | TOPICAL_CREAM | Freq: Two times a day (BID) | CUTANEOUS | Status: DC
  Administered 2014-01-10: 1 via TOPICAL
  Filled 2014-01-09: qty 15

## 2014-01-09 MED ORDER — TAMSULOSIN HCL 0.4 MG PO CAPS
0.4000 mg | ORAL_CAPSULE | Freq: Every day | ORAL | Status: DC
  Administered 2014-01-09 – 2014-01-10 (×2): 0.4 mg via ORAL
  Filled 2014-01-09 (×2): qty 1

## 2014-01-09 MED ORDER — TEMAZEPAM 15 MG PO CAPS: 30.0000 mg | ORAL_CAPSULE | Freq: Every day | ORAL | Status: DC

## 2014-01-09 MED ORDER — ONDANSETRON HCL 4 MG PO TABS: 4.0000 mg | ORAL_TABLET | Freq: Four times a day (QID) | ORAL | Status: DC | PRN

## 2014-01-09 NOTE — ED Provider Notes (Addendum)
CSN: [id number]     Arrival date & time 01/09/14  1125 History   First MD Initiated Contact with Patient 01/09/14 1149     Chief Complaint  Patient presents with  . Constipation     (Consider location/radiation/quality/duration/timing/severity/associated sxs/prior Treatment) The history is provided by the patient.  [name] is a 80 y.o. male hx of paroxysmal afib, COPD, recent pneumonia, BPH with indwelling foley here with constipation. He was recently admitted for acute renal failure and pneumonia. He came in yesterday for hematuria and Foley was changed and CBC a BMP was stable. She is here today because he states that he was straining to have a bowel movement was unable to have a bowel movement. He noticed small amount of blood when he tries to have a bowel movement. Denies fevers or chills. States that foley has been irritating him but has no hematuria.    Past Medical History  Diagnosis Date  . Atrial fibrillation with slow ventricular response     Symptoms of weakness  . Paroxysmal atrial fibrillation     Hx of, currently on amiodarone load  . Syncope     Probably related to sinus pause, finally conversion from atrial fibrillation to sinus rhythm  . COPD (chronic obstructive pulmonary disease)   . Hypertension   . Abnormal CXR 07/06/2007; 06/15/2010    Eleveted R HD 07/06/2007 > no change 09/92/2011  . Chronic back pain   . Esophageal diverticulum 08/08/2012    Large R sided per esophagram  . BPH (benign prostatic hyperplasia)    Past Surgical History  Procedure Laterality Date  . Joint replacement      right knee  . Appendectomy     Family History  Problem Relation Age of Onset  . Cancer Maternal Grandmother   . Alzheimer's disease Mother   . COPD Father     possible CHF   History  Substance Use Topics  . Smoking status: Former Smoker -- 3.00 packs/day for 10 years    Quit date: 10/14/1953  . Smokeless tobacco: Not on file  . Alcohol Use: 3.5 oz/week    7  drink(s) per week     Comment: none in two years    Review of Systems  Gastrointestinal: Positive for constipation.  All other systems reviewed and are negative.      Allergies  Lisinopril  Home Medications   Current Outpatient Rx  Name  Route  Sig  Dispense  Refill  . albuterol (PROVENTIL) (2.5 MG/3ML) 0.083% nebulizer solution   Nebulization   Take 2.5 mg by nebulization every 4 (four) hours as needed for wheezing or shortness of breath.         Marland Kitchen. aspirin 81 MG tablet   Oral   Take 81 mg by mouth daily.         . Calcium Carb-Cholecalciferol (CALCIUM + D3) 600-200 MG-UNIT TABS   Oral   Take 1 tablet by mouth daily.         . Cholecalciferol (VITAMIN D-3) 1000 UNITS CAPS   Oral   Take 100 Units by mouth daily. Take once daily         . divalproex (DEPAKOTE SPRINKLE) 125 MG capsule   Oral   Take 125 mg by mouth 2 (two) times daily.          Marland Kitchen. docusate sodium 100 MG CAPS   Oral   Take 100 mg by mouth 2 (two) times daily.   10 capsule  0   . dutasteride (AVODART) 0.5 MG capsule   Oral   Take 0.5 mg by mouth daily.         . feeding supplement, ENSURE COMPLETE, (ENSURE COMPLETE) LIQD   Oral   Take 237 mLs by mouth 3 (three) times daily between meals.         . ferrous sulfate 325 (65 FE) MG tablet   Oral   Take 325 mg by mouth 2 (two) times daily with a meal.         . FLOVENT HFA 110 MCG/ACT inhaler   Inhalation   Inhale 1 puff into the lungs 2 (two) times daily.          . furosemide (LASIX) 40 MG tablet   Oral   Take 40 mg by mouth daily.         Marland Kitchen HYDROcodone-acetaminophen (NORCO/VICODIN) 5-325 MG per tablet   Oral   Take 1 tablet by mouth 2 (two) times daily as needed for moderate pain.         Marland Kitchen ipratropium (ATROVENT) 0.02 % nebulizer solution   Nebulization   Take 0.5 mg by nebulization every 4 (four) hours as needed for wheezing or shortness of breath.         . mirtazapine (REMERON) 15 MG tablet   Oral   Take 15  mg by mouth at bedtime.         Marland Kitchen nystatin cream (MYCOSTATIN)   Topical   Apply 1 application topically 2 (two) times daily. Apply to penis/testicles and groin area.         Marland Kitchen omeprazole (PRILOSEC) 20 MG capsule   Oral   Take 20 mg by mouth daily.         . sertraline (ZOLOFT) 50 MG tablet   Oral   Take 25 mg by mouth daily. At bedtime for 14 days started 01/01/14         . sodium chloride (OCEAN) 0.65 % nasal spray   Nasal   Place 2 sprays into the nose 3 (three) times daily as needed for congestion. For dryness.         . Tamsulosin HCl (FLOMAX) 0.4 MG CAPS   Oral   Take 0.4 mg by mouth daily.         . temazepam (RESTORIL) 30 MG capsule   Oral   Take 1 capsule (30 mg total) by mouth at bedtime.   30 capsule   0    BP 112/69  Pulse 72  Temp(Src) 99.2 F (37.3 C) (Oral)  Resp 20  SpO2 97% Physical Exam  Nursing note and vitals reviewed. Constitutional: He is oriented to person, place, and time.  Chronically ill, slightly uncomfortable   HENT:  Head: Normocephalic.  Mouth/Throat: Oropharynx is clear and moist.  Eyes: Conjunctivae are normal. Pupils are equal, round, and reactive to light.  Neck: Normal range of motion. Neck supple.  Cardiovascular: Normal rate, regular rhythm and normal heart sounds.   Pulmonary/Chest: Effort normal and breath sounds normal. No respiratory distress. He has no wheezes. He has no rales.  Abdominal: Soft. Bowel sounds are normal.  Mild LLQ tenderness. Foley in place with yellowish urine.   Genitourinary:  Rectal- stool impaction   Musculoskeletal: Normal range of motion. He exhibits no edema and no tenderness.  Neurological: He is alert and oriented to person, place, and time.  Skin: Skin is warm and dry.  Psychiatric: He has a normal mood and affect. His behavior  is normal. Judgment and thought content normal.    ED Course  Fecal disimpaction Date/Time: 01/09/2014 12:33 PM Performed by: Richardean Canal Authorized by:  Richardean Canal Consent: Verbal consent obtained. Risks and benefits: risks, benefits and alternatives were discussed Consent given by: patient Patient understanding: patient states understanding of the procedure being performed Patient consent: the patient's understanding of the procedure matches consent given Procedure consent: procedure consent matches procedure scheduled Relevant documents: relevant documents present and verified Patient identity confirmed: verbally with patient and arm band Local anesthesia used: yes Local anesthetic: lidocaine 2% without epinephrine Anesthetic total: 10 ml Patient sedated: no Patient tolerance: Patient tolerated the procedure well with no immediate complications.   (including critical care time)   Labs Review Labs Reviewed - No data to display Imaging Review Dg Abd 2 Views  01/09/2014   CLINICAL DATA:  Abdominal pain, constipation.  Distension.  EXAM: ABDOMEN - 2 VIEW  COMPARISON:  None.  FINDINGS: Large stool burden throughout the colon. No evidence of bowel obstruction. No free air organomegaly. No suspicious calcification. Calcified phleboliths in the pelvis.  IMPRESSION: Large stool burden within the colon.   Electronically Signed   By: Charlett Nose M.D.   On: 01/09/2014 14:43     EKG Interpretation None      MDM   Final diagnoses:  None   [name] is a 80 y.o. male here with stool impaction. No signs of SBO on xray. I disimpacted him and gave him enema with good results. Stable to return to [facility].   3 PM Right before discharge, patient became hypotensive to 90/60. Mentating well. Given recent admission for sepsis, will initiate sepsis workup again.   3:40 PM CXR stable. Labs pending. I signed out to Dr. Manus Gunning to f/u labs and reassess patient.    Richardean Canal, MD 01/09/14 1424  Richardean Canal, MD 01/09/14 1447  Richardean Canal, MD 01/09/14 [phone]

## 2014-01-09 NOTE — ED Notes (Signed)
EMS states that the patient and staff at [facility] are unsure of the last BM that this patient has had. The patient states he noticed some blood when trying to have a BM. States that he has the urge to defecate but is unable to. Also states that he does not want the catheter that he has placed at present.

## 2014-01-09 NOTE — H&P (Signed)
[facility] History and Physical  Evan IdeClyde M Langan ZOX:[id number] DOB: 04-03-1922 DOA: 01/09/2014  Referring physician: Dr. Manus Gunningancour PCP: Ginette OttoSTONEKING,HAL THOMAS, MD   Chief Complaint: abd pain, constipation, hypotension  HPI: [name] is a 80 y.o. male with past medical hx significant for paroxysmal atrial fibrillation (with slow ventricular response and bradycardia), HTN, chronic diastolic heart failure, protein calorie malnutrition, BPH and recent admission due to PNA/sepsis; came to ED complaining of abd pain and constipation. In ED patient was des-impacted and subsequent has good BM. Patient found to be dehydrated, with elevated lactic acid and hypotensive. Despite initial IVF's resuscitation BP remains in the 90's sytolic and TRH called to admit in observation for further evaluation and treatment. Patient denies fever, chills, CP, SOB, cough, HA's or any other acute complaints.  Of note; patient visited ED yesterday due to hematuria and urinary retention, required placement of indwelling foley and was instructed to follow with urology as an outpatient.    Review of Systems:  Negative except as mentioned on HPI.  Past Medical History  Diagnosis Date  . Atrial fibrillation with slow ventricular response     Symptoms of weakness  . Paroxysmal atrial fibrillation     Hx of, currently on amiodarone load  . Syncope     Probably related to sinus pause, finally conversion from atrial fibrillation to sinus rhythm  . COPD (chronic obstructive pulmonary disease)   . Hypertension   . Abnormal CXR 07/06/2007; 06/15/2010    Eleveted R HD 07/06/2007 > no change 09/92/2011  . Chronic back pain   . Esophageal diverticulum 08/08/2012    Large R sided per esophagram  . BPH (benign prostatic hyperplasia)    Past Surgical History  Procedure Laterality Date  . Joint replacement      right knee  . Appendectomy     Social History:  reports that he quit smoking about 60 years ago. He does not  have any smokeless tobacco history on file. He reports that he drinks about 3.5 ounces of alcohol per week. He reports that he does not use illicit drugs.  Allergies  Allergen Reactions  . Lisinopril Other (See Comments)    Unknown, listed on MAR    Family History  Problem Relation Age of Onset  . Cancer Maternal Grandmother   . Alzheimer's disease Mother   . COPD Father     possible CHF     Prior to Admission medications   Medication Sig Start Date End Date Taking? Authorizing Provider  albuterol (PROVENTIL) (2.5 MG/3ML) 0.083% nebulizer solution Take 2.5 mg by nebulization every 4 (four) hours as needed for wheezing or shortness of breath.   Yes Historical Provider, MD  aspirin 81 MG tablet Take 81 mg by mouth daily.   Yes Historical Provider, MD  Calcium Carb-Cholecalciferol (CALCIUM + D3) 600-200 MG-UNIT TABS Take 1 tablet by mouth daily.   Yes Historical Provider, MD  Cholecalciferol (VITAMIN D-3) 1000 UNITS CAPS Take 100 Units by mouth daily. Take once daily   Yes Historical Provider, MD  divalproex (DEPAKOTE SPRINKLE) 125 MG capsule Take 125 mg by mouth 2 (two) times daily.  11/12/13  Yes Historical Provider, MD  docusate sodium 100 MG CAPS Take 100 mg by mouth 2 (two) times daily. 12/17/13  Yes Kathlen ModyVijaya Akula, MD  dutasteride (AVODART) 0.5 MG capsule Take 0.5 mg by mouth daily.   Yes Historical Provider, MD  feeding supplement, ENSURE COMPLETE, (ENSURE COMPLETE) LIQD Take 237 mLs by mouth 3 (three) times daily  between meals. 12/17/13  Yes Kathlen Mody, MD  ferrous sulfate 325 (65 FE) MG tablet Take 325 mg by mouth 2 (two) times daily with a meal.   Yes Historical Provider, MD  FLOVENT HFA 110 MCG/ACT inhaler Inhale 1 puff into the lungs 2 (two) times daily.  11/19/13  Yes Historical Provider, MD  furosemide (LASIX) 40 MG tablet Take 40 mg by mouth daily.   Yes Historical Provider, MD  HYDROcodone-acetaminophen (NORCO/VICODIN) 5-325 MG per tablet Take 1 tablet by mouth 2 (two) times daily  as needed for moderate pain.   Yes Historical Provider, MD  ipratropium (ATROVENT) 0.02 % nebulizer solution Take 0.5 mg by nebulization every 4 (four) hours as needed for wheezing or shortness of breath.   Yes Historical Provider, MD  mirtazapine (REMERON) 15 MG tablet Take 15 mg by mouth at bedtime.   Yes Historical Provider, MD  nystatin cream (MYCOSTATIN) Apply 1 application topically 2 (two) times daily. Apply to penis/testicles and groin area. 01/06/14 01/20/14 Yes Junius Argyle, MD  omeprazole (PRILOSEC) 20 MG capsule Take 20 mg by mouth daily.   Yes Historical Provider, MD  sertraline (ZOLOFT) 50 MG tablet Take 25 mg by mouth daily. At bedtime for 14 days started 01/01/14 12/10/13  Yes Historical Provider, MD  sodium chloride (OCEAN) 0.65 % nasal spray Place 2 sprays into the nose 3 (three) times daily as needed for congestion. For dryness.   Yes Historical Provider, MD  Tamsulosin HCl (FLOMAX) 0.4 MG CAPS Take 0.4 mg by mouth daily.   Yes Historical Provider, MD  temazepam (RESTORIL) 30 MG capsule Take 1 capsule (30 mg total) by mouth at bedtime. 12/17/13  Yes Kathlen Mody, MD  polyethylene glycol (MIRALAX / GLYCOLAX) packet Take 17 g by mouth daily as needed for moderate constipation. 01/09/14   Richardean Canal, MD   Physical Exam: Filed Vitals:   01/09/14 1736  BP: 104/53  Pulse: 80  Temp:   Resp:     BP 104/53  Pulse 80  Temp(Src) 98.9 F (37.2 C) (Oral)  Resp 16  SpO2 91%  General:  Appears calm and comfortable, afebrile Eyes: PERRL, normal lids, irises & conjunctiva ENT: grossly normal hearing, dry MM, no erythema or exudates; poor dentition  And missing teeth; no drainage out of ears or nostrils Neck: no LAD, masses or thyromegaly, no JVD Cardiovascular: RRR, no m/r/g. No LE edema. Respiratory: CTA bilaterally, no w/r/r. Normal respiratory effort. Abdomen: soft, nt, nd and with positive BS Skin: mild erythema on his groin/testicle area; no suppuration  Musculoskeletal:  grossly normal tone BUE/BLE Psychiatric: grossly normal mood and affect, speech fluent and appropriate Neurologic: grossly non-focal.          Labs on Admission:  Basic Metabolic Panel:  Recent Labs Lab 01/06/14 2023 01/09/14 1600  NA 144 143  K 4.4 4.3  CL 102 102  CO2 32 29  GLUCOSE 101* 116*  BUN 27* 20  CREATININE 0.53 0.51  CALCIUM 9.4 9.2   CBC:  Recent Labs Lab 01/06/14 2023 01/09/14 1600  WBC 7.0 8.4  NEUTROABS 3.5 5.2  HGB 9.7* 9.9*  HCT 29.3* 29.8*  MCV 110.2* 109.6*  PLT 523* 506*    Radiological Exams on Admission: Dg Chest 2 View  01/09/2014   CLINICAL DATA:  Hypotension.  Abdominal pain.  EXAM: CHEST  2 VIEW  COMPARISON:  12/17/2013.  FINDINGS: Poor inspiration. The patient's chin is obscuring the medial lung apices. Stable prominence of the interstitial markings. Diffuse  osteopenia. Mild scoliosis.  IMPRESSION: Limited examination demonstrating stable chronic interstitial lung disease. No acute abnormality visible.   Electronically Signed   By: Gordan Payment M.D.   On: 01/09/2014 15:35   Dg Abd 2 Views  01/09/2014   CLINICAL DATA:  Abdominal pain, constipation.  Distension.  EXAM: ABDOMEN - 2 VIEW  COMPARISON:  None.  FINDINGS: Large stool burden throughout the colon. No evidence of bowel obstruction. No free air organomegaly. No suspicious calcification. Calcified phleboliths in the pelvis.  IMPRESSION: Large stool burden within the colon.   Electronically Signed   By: Charlett Nose M.D.   On: 01/09/2014 14:43    Assessment/Plan 1-hypotension: due to dehydration, continue use of diuretics and might also be secondary to vasovagal stimulation with des-impaction. Patient asymptomatic currently. -will admit for observation -fluid resuscitation -will check orthostatic VS -hold diuretics agents -no signs of infection  2-lactic acidosis: due to dehydration and hypotension. -provide IVF's -repeat lactic acid in am -hold diuretics  3-Constipation: s/p  des-impaction in ED. -will check TSH -continue colace and add miralax daily  4-Paroxysmal Atrial fibrillation: rate controlled and sinus currently.  -patient at home was not taking any antiarrhythmic or rate controlling agent given bradycardia since last admission. -continue ASA  5-DIASTOLIC HEART FAILURE, CHRONIC: currently compensated to dehydration/intravascular depletion at this point -will provide IVF's -hold diuretics -follow strict intake and output -check daily weight -once euvolemic again, resume lasix  6-Protein-calorie malnutrition, severe: continue ensure  7-depression/dementia with behavioral disturbances: continue remeron and depakote -stable  8-Urinary retention with BPH (benign prostatic hyperplasia) and mild hematuria: patient recently discharge from ED with foley in place. -had instructions to follow with urology as an outpatient -continue flomax and dutasteride -avoid heparin products given hematuria  9-insomnia: continue restoril  10-dysphagia and esophageal diverticulum: continue dys 3 diet and thin liquids.  11-macrocytic anemia: continue B12 supplementation.  12-candida skin infection: continue mycostatin topically  DVT: SCD's    Code Status: Full Family Communication: no family at bedside Disposition Plan: observation, LOS < 2 midnights, med-surg  Time spent: 45 minutes  Vassie Loll [facility] Pager [phone]

## 2014-01-09 NOTE — ED Provider Notes (Signed)
Assumed care from Dr. Silverio LayYao at 4 PM. Patient with hypotension after disimpaction. Recent admission for aspiration pneumonia and septic shock. Blood pressure improved to 104 systolic after 1 L of fluid. Her rate in 70s. Patient denies symptoms. Lactate 3.0. No evidence of infection in UA or her chest. No abdominal pain. Given patient's age and risk factors it was very hospitalization will observe overnight for observation of blood pressure. No evidence of sepsis at this time. Suspect vasovagal response after disimpaction  Glynn OctaveStephen Aiyden Lauderback, MD 01/09/14 [phone]

## 2014-01-09 NOTE — ED Notes (Signed)
CBG registered 89 on ED Glucometer.

## 2014-01-09 NOTE — Progress Notes (Signed)
Pt received to 1334. Report obtained from ED nurse @ 1815. [name], [name]

## 2014-01-09 NOTE — ED Notes (Signed)
Called report to [facility] voiced understanding of instructions.

## 2014-01-09 NOTE — ED Notes (Signed)
PT  In XRAY

## 2014-01-09 NOTE — ED Notes (Signed)
Meal tray ordered 

## 2014-01-09 NOTE — ED Notes (Signed)
Bed: [id number] Expected date: 01/09/14 Expected time: 11:18 AM Means of arrival: Ambulance Comments: [place] Constipation

## 2014-01-09 NOTE — ED Notes (Signed)
Apple sauce given to pt 

## 2014-01-10 DIAGNOSIS — B379 Candidiasis, unspecified: Secondary | ICD-10-CM | POA: Diagnosis present

## 2014-01-10 DIAGNOSIS — I4891 Unspecified atrial fibrillation: Secondary | ICD-10-CM

## 2014-01-10 DIAGNOSIS — E872 Acidosis, unspecified: Secondary | ICD-10-CM | POA: Diagnosis present

## 2014-01-10 DIAGNOSIS — R4702 Dysphasia: Secondary | ICD-10-CM | POA: Diagnosis present

## 2014-01-10 DIAGNOSIS — F329 Major depressive disorder, single episode, unspecified: Secondary | ICD-10-CM | POA: Diagnosis present

## 2014-01-10 DIAGNOSIS — N400 Enlarged prostate without lower urinary tract symptoms: Secondary | ICD-10-CM

## 2014-01-10 DIAGNOSIS — F03918 Unspecified dementia, unspecified severity, with other behavioral disturbance: Secondary | ICD-10-CM | POA: Diagnosis present

## 2014-01-10 DIAGNOSIS — I959 Hypotension, unspecified: Secondary | ICD-10-CM

## 2014-01-10 DIAGNOSIS — F32A Depression, unspecified: Secondary | ICD-10-CM | POA: Diagnosis present

## 2014-01-10 DIAGNOSIS — D539 Nutritional anemia, unspecified: Secondary | ICD-10-CM | POA: Diagnosis present

## 2014-01-10 DIAGNOSIS — F0391 Unspecified dementia with behavioral disturbance: Secondary | ICD-10-CM | POA: Diagnosis present

## 2014-01-10 DIAGNOSIS — K5900 Constipation, unspecified: Secondary | ICD-10-CM

## 2014-01-10 LAB — LACTIC ACID, PLASMA: Lactic Acid, Venous: 1.2 mmol/L (ref 0.5–2.2)

## 2014-01-10 LAB — BASIC METABOLIC PANEL
BUN: 22 mg/dL (ref 6–23)
CALCIUM: 8.4 mg/dL (ref 8.4–10.5)
CO2: 30 mEq/L (ref 19–32)
CREATININE: 0.52 mg/dL (ref 0.50–1.35)
Chloride: 105 mEq/L (ref 96–112)
GFR calc non Af Amer: >90 mL/min (ref >90)
Glucose, Bld: 122 mg/dL — ABNORMAL HIGH (ref 70–99)
Potassium: 4.3 mEq/L (ref 3.7–5.3)
Sodium: 143 mEq/L (ref 137–147)

## 2014-01-10 LAB — URINE CULTURE
Colony Count: NO GROWTH
Culture: NO GROWTH

## 2014-01-10 LAB — TSH: TSH: 2.416 u[IU]/mL (ref 0.350–4.500)

## 2014-01-10 LAB — CBC
HCT: 25.1 % — ABNORMAL LOW (ref 39.0–52.0)
Hemoglobin: 8.2 g/dL — ABNORMAL LOW (ref 13.0–17.0)
MCH: 36.4 pg — ABNORMAL HIGH (ref 26.0–34.0)
MCHC: 32.7 g/dL (ref 30.0–36.0)
MCV: 111.6 fL — ABNORMAL HIGH (ref 78.0–100.0)
Platelets: 463 10*3/uL — ABNORMAL HIGH (ref 150–400)
RBC: 2.25 MIL/uL — ABNORMAL LOW (ref 4.22–5.81)
RDW: 20 % — AB (ref 11.5–15.5)
WBC: 5.1 10*3/uL (ref 4.0–10.5)

## 2014-01-10 LAB — GLUCOSE, CAPILLARY: GLUCOSE-CAPILLARY: 89 mg/dL (ref 70–99)

## 2014-01-10 MED ORDER — POLYETHYLENE GLYCOL 3350 17 G PO PACK: 17.0000 g | PACK | Freq: Every day | ORAL | Status: DC

## 2014-01-10 NOTE — Progress Notes (Signed)
Called [facility] to give report to the nurse. Update given to nurse there. Pt ready to be d/c.

## 2014-01-10 NOTE — Progress Notes (Signed)
UR completed 

## 2014-01-10 NOTE — Plan of Care (Signed)
Problem: Phase I Progression Outcomes Goal: Voiding-avoid urinary catheter unless indicated Outcome: Not Applicable Date Met:  01/10/14 Pt has chronic foley     

## 2014-01-10 NOTE — Progress Notes (Signed)
Clinical Social Work Department BRIEF PSYCHOSOCIAL ASSESSMENT 01/10/2014  Patient:  EAMES, DIBIASIO     Account Number:  [id number]     [place] date:  01/09/2014  Clinical Social Worker:  Lacie Scotts  Date/Time:  01/10/2014 01:23 PM  Referred by:  Physician  Date Referred:  01/10/2014 Referred for  ALF Placement   Other Referral:   Interview type:  Patient Other interview type:    PSYCHOSOCIAL DATA Living Status:  FACILITY Admitted from facility:  [facility] Level of care:   Primary support name:  Arta Silence Primary support relationship to patient:  CHILD, ADULT Degree of support available:   supportive    CURRENT CONCERNS Current Concerns  Post-Acute Placement   Other Concerns:    SOCIAL WORK ASSESSMENT / PLAN Pt is an 80 yr old gentleman admitted from [facility] on 3/29 with constipation. CSW met with pt this am, and spoke with daughter,to assist with d/c planning. Pt would like to return to ALF today. He is feeling better. MD finds that constipation has been resolved and is ready to return to ALF. Daughter is in agreement with d/c plan. [facility] contacted and will readmit. D/C summary has been sent to ALF. Ginger, at ALF, understands that pt has been hospitalized under OBS status for less than 24 hrs and FL2 will not be completed at d/c.   Assessment/plan status:  Psychosocial Support/Ongoing Assessment of Needs Other assessment/ plan:   Information/referral to community resources:   none needed at this time    PATIENT'S/FAMILY'S RESPONSE TO PLAN OF CARE: Pt is feeling much better and is anxious to return home to [facility].   Werner Lean LCSW [phone]

## 2014-01-10 NOTE — Discharge Instructions (Signed)
Take miralax daily as needed for constipation.   Stay hydrated.   See your regular doctor. See a urologist about your foley.   Return to ER if you have worse constipation, abdominal pain, vomiting, fever.    Constipation, Adult Constipation is when a person has fewer than 3 bowel movements a week; has difficulty having a bowel movement; or has stools that are dry, hard, or larger than normal. As people grow older, constipation is more common. If you try to fix constipation with medicines that make you have a bowel movement (laxatives), the problem may get worse. Long-term laxative use may cause the muscles of the colon to become weak. A low-fiber diet, not taking in enough fluids, and taking certain medicines may make constipation worse. CAUSES   Certain medicines, such as antidepressants, pain medicine, iron supplements, antacids, and water pills.   Certain diseases, such as diabetes, irritable bowel syndrome (IBS), thyroid disease, or depression.   Not drinking enough water.   Not eating enough fiber-rich foods.   Stress or travel.  Lack of physical activity or exercise.  Not going to the restroom when there is the urge to have a bowel movement.  Ignoring the urge to have a bowel movement.  Using laxatives too much. SYMPTOMS   Having fewer than 3 bowel movements a week.   Straining to have a bowel movement.   Having hard, dry, or larger than normal stools.   Feeling full or bloated.   Pain in the lower abdomen.  Not feeling relief after having a bowel movement. DIAGNOSIS  Your caregiver will take a medical history and perform a physical exam. Further testing may be done for severe constipation. Some tests may include:   A barium enema X-ray to examine your rectum, colon, and sometimes, your small intestine.  A sigmoidoscopy to examine your lower colon.  A colonoscopy to examine your entire colon. TREATMENT  Treatment will depend on the severity of your  constipation and what is causing it. Some dietary treatments include drinking more fluids and eating more fiber-rich foods. Lifestyle treatments may include regular exercise. If these diet and lifestyle recommendations do not help, your caregiver may recommend taking over-the-counter laxative medicines to help you have bowel movements. Prescription medicines may be prescribed if over-the-counter medicines do not work.  HOME CARE INSTRUCTIONS   Increase dietary fiber in your diet, such as fruits, vegetables, whole grains, and beans. Limit high-fat and processed sugars in your diet, such as Jamaica[place] fries, hamburgers, cookies, candies, and soda.   A fiber supplement may be added to your diet if you cannot get enough fiber from foods.   Drink enough fluids to keep your urine clear or pale yellow.   Exercise regularly or as directed by your caregiver.   Go to the restroom when you have the urge to go. Do not hold it.  Only take medicines as directed by your caregiver. Do not take other medicines for constipation without talking to your caregiver first. SEEK IMMEDIATE MEDICAL CARE IF:   You have bright red blood in your stool.   Your constipation lasts for more than 4 days or gets worse.   You have abdominal or rectal pain.   You have thin, pencil-like stools.  You have unexplained weight loss. MAKE SURE YOU:   Understand these instructions.  Will watch your condition.  Will get help right away if you are not doing well or get worse. Document Released: 06/28/2004 Document Revised: 12/23/2011 Document Reviewed: 07/12/2013 [employer] Patient  Information ©2014 [employer]. ° °

## 2014-01-10 NOTE — Discharge Summary (Addendum)
Physician Discharge Summary  [name]:[id number] DOB: Apr 10, 1922 DOA: 01/09/2014  PCP: Ginette Otto, MD  Admit date: 01/09/2014 Discharge date: 01/10/2014  Recommendations for Outpatient Follow-up:  1. Resume Lasix when blood pressure can tolerate. 2. Followup with urology for outpatient evaluation of urinary retention.  Discharge Diagnoses:  Principal Problem:    Constipation Active Problems:    Atrial fibrillation    DIASTOLIC HEART FAILURE, CHRONIC    Esophageal diverticulum    Insomnia    Hypotension    Protein-calorie malnutrition, severe    Urinary retention    BPH (benign prostatic hyperplasia)    Lactic acidosis    Dysphasia    Macrocytic anemia    Candida infection    Depression    Dementia with behavioral disturbance   Discharge Condition: Improved.  Diet recommendation: Low-sodium, heart healthy. Dysphasia 3 with thin liquids.  History of present illness:  [name] is an 80 y.o. male with a PMH of PAF, hypertension, chronic diastolic CHF, protein calorie malnutrition, BPH, recent hospitalization for pneumonia/sepsis who was admitted on 01/09/14 with constipation. He was disimpacted in the ER, but referred to the hospitalist service for depression secondary to dehydration, hypotension and lactic acidosis.  Hospital Course by problem:  Principal Problem:  Constipation  Given a fleets enema and disimpacted in the ER. Continue Colace and MiraLAX as needed. TSH within normal limits.  Active Problems:  Lactic acidosis  Resolved. Lactic acid 1.2 this morning.  Atrial fibrillation  Not on any antiarrhythmics or rate control and medication secondary to history of bradycardia. Continue aspirin.  DIASTOLIC HEART FAILURE, CHRONIC  Fluid balance up 1 L. Monitor closely for signs of decompensated heart failure. Diuretics on hold secondary to soft blood pressure. Resume when blood pressure can tolerate.  Hypotension  Received IV fluids.  Diuretics on hold. Check orthostatic vital signs.  Protein-calorie malnutrition, severe  Continue nutritional supplements with Ensure and Remeron for appetite stimulation.  Urinary retention / BPH (benign prostatic hyperplasia)  Has chronic indwelling Foley with instructions to followup with urology as outpatient. Continue Flomax and dutasteride. No heparin secondary to recent hematuria.  Insomnia  Continue Restoril as needed.  Dysphasia / esophageal diverticulum  Continue dysphagia 3 diet with thin liquids.  Macrocytic anemia  Continue B12 supplementation.  Candida skin infection  Continue topical Mycostatin.  Depression / dementia with behavioral disturbances  Continue Zoloft Remeron and Depakote.  Procedures:  None  Consultations:  None  Discharge Exam: Filed Vitals:   01/10/14 0702  BP: 97/53  Pulse: 67  Temp:   Resp: 24   Filed Vitals:   01/09/14 2250 01/09/14 2253 01/10/14 0655 01/10/14 0702  BP: 99/63 100/61 109/53 97/53  Pulse: 78 78 68 67  Temp: 98.1 F (36.7 C)  98.4 F (36.9 C)   TempSrc: Oral  Oral   Resp: 24 20 16 24   Height:      Weight:    61.9 kg (136 lb 7.4 oz)  SpO2: 93% 97% 93% 95%    Gen:  NAD Cardiovascular:  RRR, No M/R/G Respiratory: Lungs CTAB Gastrointestinal: Abdomen soft, NT/ND with normal active bowel sounds. Extremities: No C/E/C   Discharge Instructions  Discharge Orders   Future Orders Complete By Expires   (HEART FAILURE PATIENTS) Call MD:  Anytime you have any of the following symptoms: 1) 3 pound weight gain in 24 hours or 5 pounds in 1 week 2) shortness of breath, with or without a dry hacking cough 3) swelling in the  hands, feet or stomach 4) if you have to sleep on extra pillows at night in order to breathe.  As directed    Call MD for:  As directed    Scheduling Instructions:     Recurrent constipation.   Diet - low sodium heart healthy  As directed    Increase activity slowly  As directed    Walk with assistance  As  directed    Walker   As directed        Medication List    STOP taking these medications       furosemide 40 MG tablet  Commonly known as:  LASIX      TAKE these medications       albuterol (2.5 MG/3ML) 0.083% nebulizer solution  Commonly known as:  PROVENTIL  Take 2.5 mg by nebulization every 4 (four) hours as needed for wheezing or shortness of breath.     aspirin 81 MG tablet  Take 81 mg by mouth daily.     Calcium + D3 600-200 MG-UNIT Tabs  Take 1 tablet by mouth daily.     divalproex 125 MG capsule  Commonly known as:  DEPAKOTE SPRINKLE  Take 125 mg by mouth 2 (two) times daily.     DSS 100 MG Caps  Take 100 mg by mouth 2 (two) times daily.     dutasteride 0.5 MG capsule  Commonly known as:  AVODART  Take 0.5 mg by mouth daily.     feeding supplement (ENSURE COMPLETE) Liqd  Take 237 mLs by mouth 3 (three) times daily between meals.     ferrous sulfate 325 (65 FE) MG tablet  Take 325 mg by mouth 2 (two) times daily with a meal.     FLOVENT HFA 110 MCG/ACT inhaler  Generic drug:  fluticasone  Inhale 1 puff into the lungs 2 (two) times daily.     HYDROcodone-acetaminophen 5-325 MG per tablet  Commonly known as:  NORCO/VICODIN  Take 1 tablet by mouth 2 (two) times daily as needed for moderate pain.     ipratropium 0.02 % nebulizer solution  Commonly known as:  ATROVENT  Take 0.5 mg by nebulization every 4 (four) hours as needed for wheezing or shortness of breath.     mirtazapine 15 MG tablet  Commonly known as:  REMERON  Take 15 mg by mouth at bedtime.     nystatin cream  Commonly known as:  MYCOSTATIN  Apply 1 application topically 2 (two) times daily. Apply to penis/testicles and groin area.     omeprazole 20 MG capsule  Commonly known as:  PRILOSEC  Take 20 mg by mouth daily.     polyethylene glycol packet  Commonly known as:  MIRALAX / GLYCOLAX  Take 17 g by mouth daily as needed for moderate constipation.     sertraline 50 MG tablet    Commonly known as:  ZOLOFT  Take 25 mg by mouth daily. At bedtime for 14 days started 01/01/14     sodium chloride 0.65 % nasal spray  Commonly known as:  OCEAN  Place 2 sprays into the nose 3 (three) times daily as needed for congestion. For dryness.     tamsulosin 0.4 MG Caps capsule  Commonly known as:  FLOMAX  Take 0.4 mg by mouth daily.     temazepam 30 MG capsule  Commonly known as:  RESTORIL  Take 1 capsule (30 mg total) by mouth at bedtime.     Vitamin D-3 1000 UNITS  Caps  Take 100 Units by mouth daily. Take once daily           Follow-up Information   Follow up with Ginette Otto, MD. Schedule an appointment as soon as possible for a visit in 1 week. [facility] follow up.)    Specialty:  Internal Medicine   Contact information:   [address] [place] [address] [phone]       Schedule an appointment as soon as possible for a visit with [facility] (For evaluation of urinary retention.)    Contact information:   [address] [place] [address] [phone]        The results of significant diagnostics from this hospitalization (including imaging, microbiology, ancillary and laboratory) are listed below for reference.    Significant Diagnostic Studies: Dg Chest 2 View  01/09/2014   CLINICAL DATA:  Hypotension.  Abdominal pain.  EXAM: CHEST  2 VIEW  COMPARISON:  12/17/2013.  FINDINGS: Poor inspiration. The patient's chin is obscuring the medial lung apices. Stable prominence of the interstitial markings. Diffuse osteopenia. Mild scoliosis.  IMPRESSION: Limited examination demonstrating stable chronic interstitial lung disease. No acute abnormality visible.   Electronically Signed   By: Gordan Payment M.D.   On: 01/09/2014 15:35   Dg Abd 2 Views  01/09/2014   CLINICAL DATA:  Abdominal pain, constipation.  Distension.  EXAM: ABDOMEN - 2 VIEW  COMPARISON:  None.  FINDINGS: Large stool burden throughout the  colon. No evidence of bowel obstruction. No free air organomegaly. No suspicious calcification. Calcified phleboliths in the pelvis.  IMPRESSION: Large stool burden within the colon.   Electronically Signed   By: Charlett Nose M.D.   On: 01/09/2014 14:43    Labs:  Basic Metabolic Panel:  Recent Labs Lab 01/06/14 2023 01/09/14 1600 01/09/14 2010 01/10/14 0415  NA 144 143  --  143  K 4.4 4.3  --  4.3  CL 102 102  --  105  CO2 32 29  --  30  GLUCOSE 101* 116*  --  122*  BUN 27* 20  --  22  CREATININE 0.53 0.51  --  0.52  CALCIUM 9.4 9.2  --  8.4  MG  --   --  2.0  --   PHOS  --   --  3.4  --    GFR Estimated Creatinine Clearance: 54.8 ml/min (by C-G formula based on Cr of 0.52).  CBC:  Recent Labs Lab 01/06/14 2023 01/09/14 1600 01/10/14 0415  WBC 7.0 8.4 5.1  NEUTROABS 3.5 5.2  --   HGB 9.7* 9.9* 8.2*  HCT 29.3* 29.8* 25.1*  MCV 110.2* 109.6* 111.6*  PLT 523* 506* 463*   CBG:  Recent Labs Lab 01/09/14 1635  GLUCAP 89   Thyroid function studies  Recent Labs  01/09/14 2010  TSH 2.416   Microbiology Recent Results (from the past 240 hour(s))  URINE CULTURE     Status: None   Collection Time    01/06/14  8:27 PM      Result Value Ref Range Status   Specimen Description URINE, CATHETERIZED   Final   Special Requests Normal   Final   Culture  Setup Time     Final   Value: 01/07/2014 04:29     Performed at [facility] Count     Final   Value: NO GROWTH     Performed at [facility]   Culture     Final   Value: NO GROWTH     Performed at [facility]   Report Status 01/08/2014 FINAL   Final    Time coordinating discharge: 25 minutes.  Signed:  Malone Vanblarcom  Pager [phone] [facility] 01/10/2014, 10:36 AM

## 2014-03-14 DEATH — deceased

## 2014-09-30 IMAGING — CR DG CHEST 2V
3 series · 3 of 3 positions shown · non-contrast
Comparison: 12/17/2013.

CLINICAL DATA: Hypotension.  Abdominal pain.

EXAM:
CHEST  2 VIEW

[w chest lat]
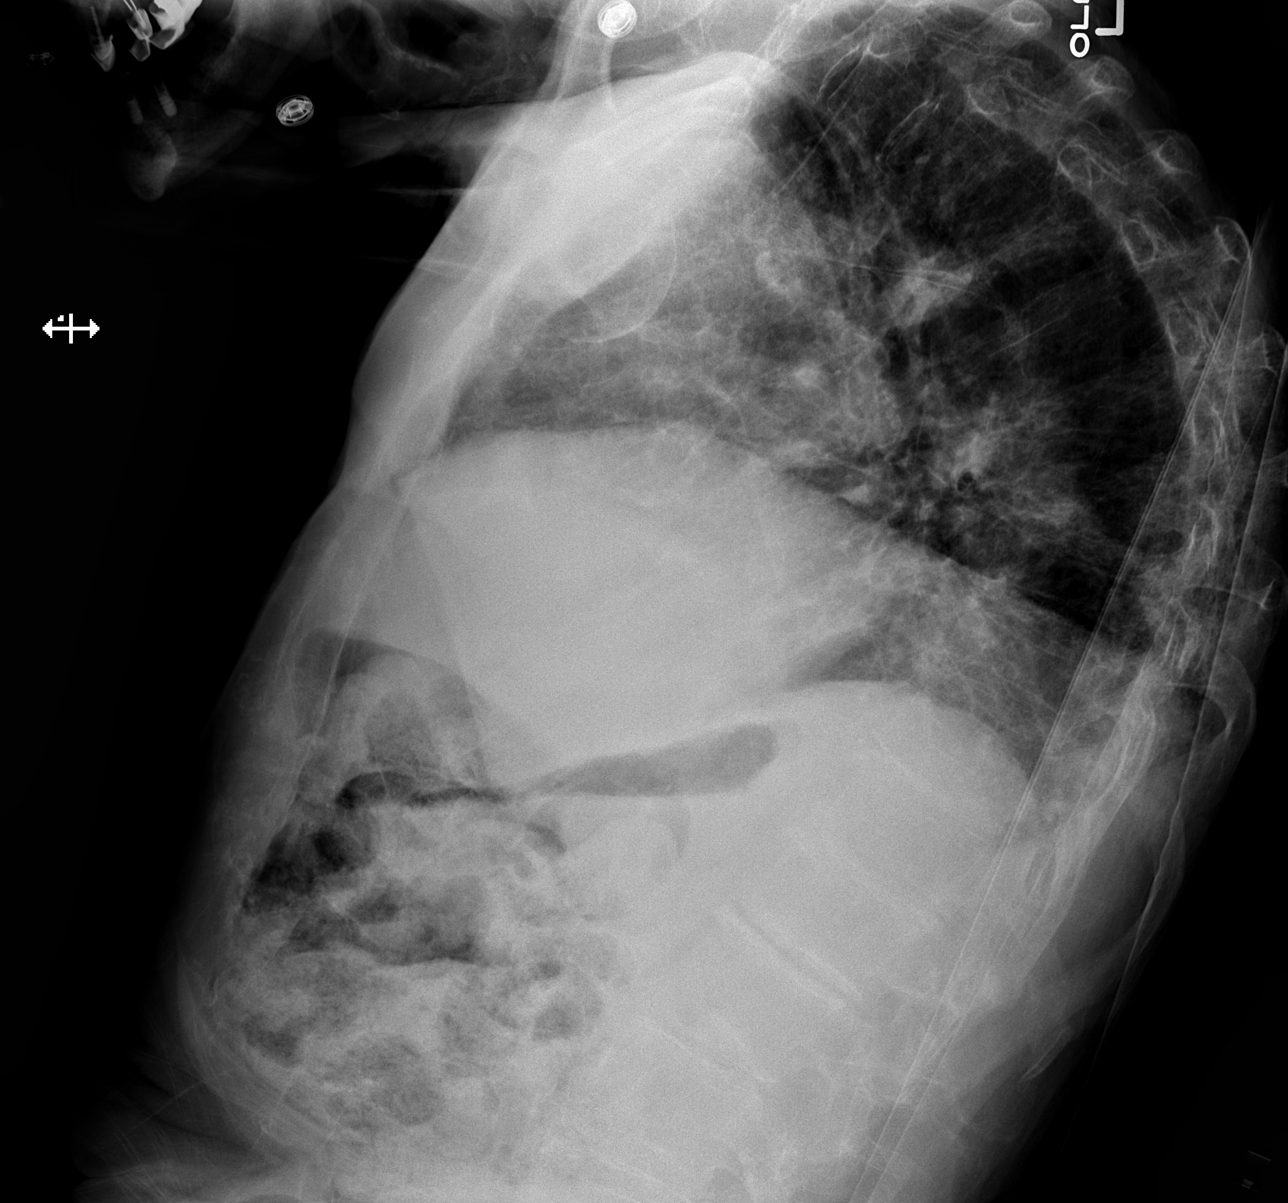

[x chest ap (1 of 2)]
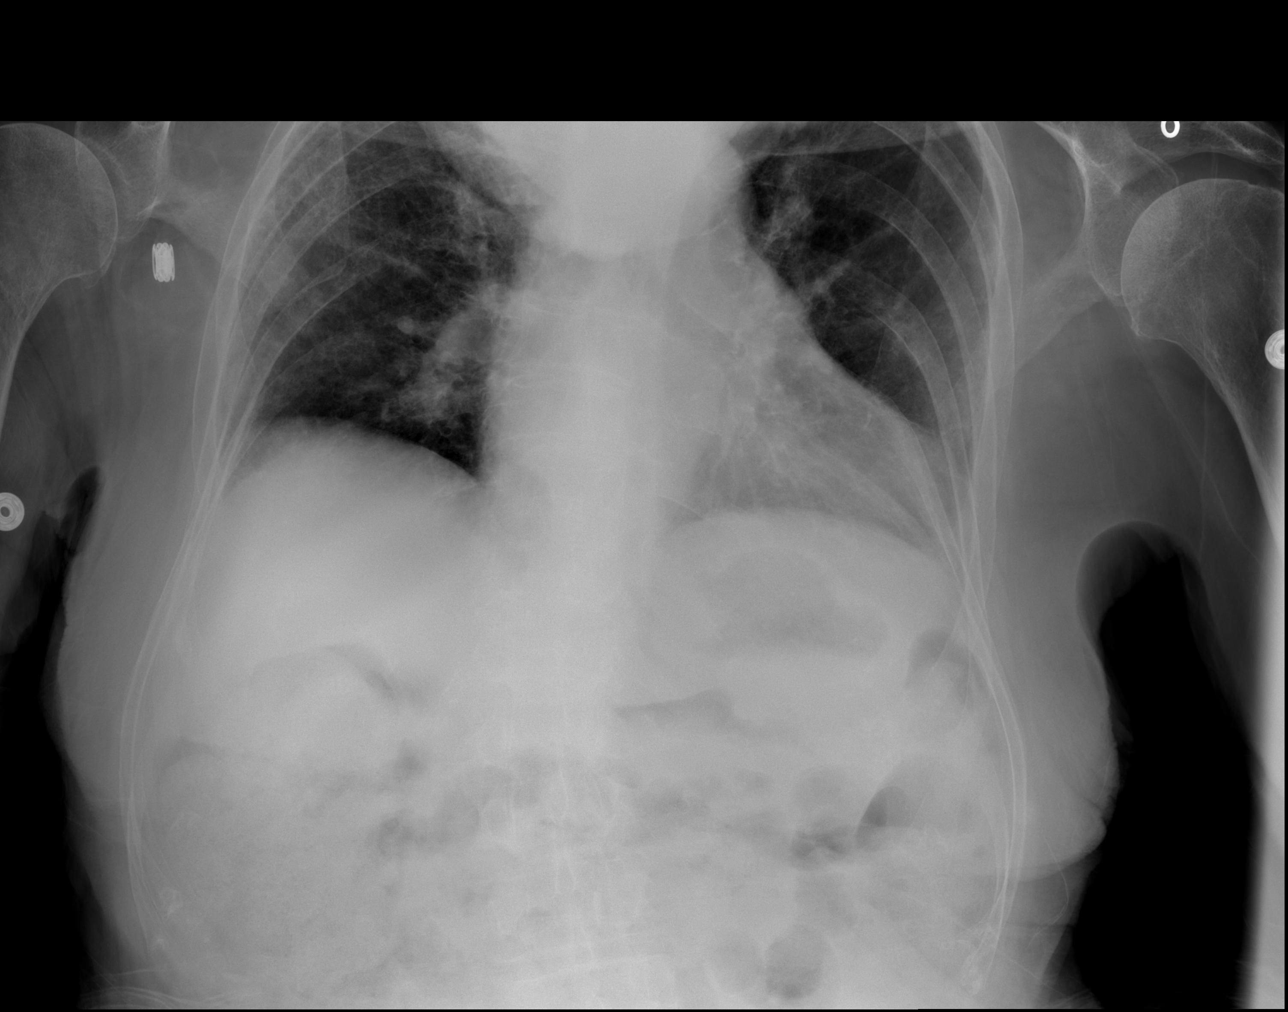

[x chest ap (2 of 2)]
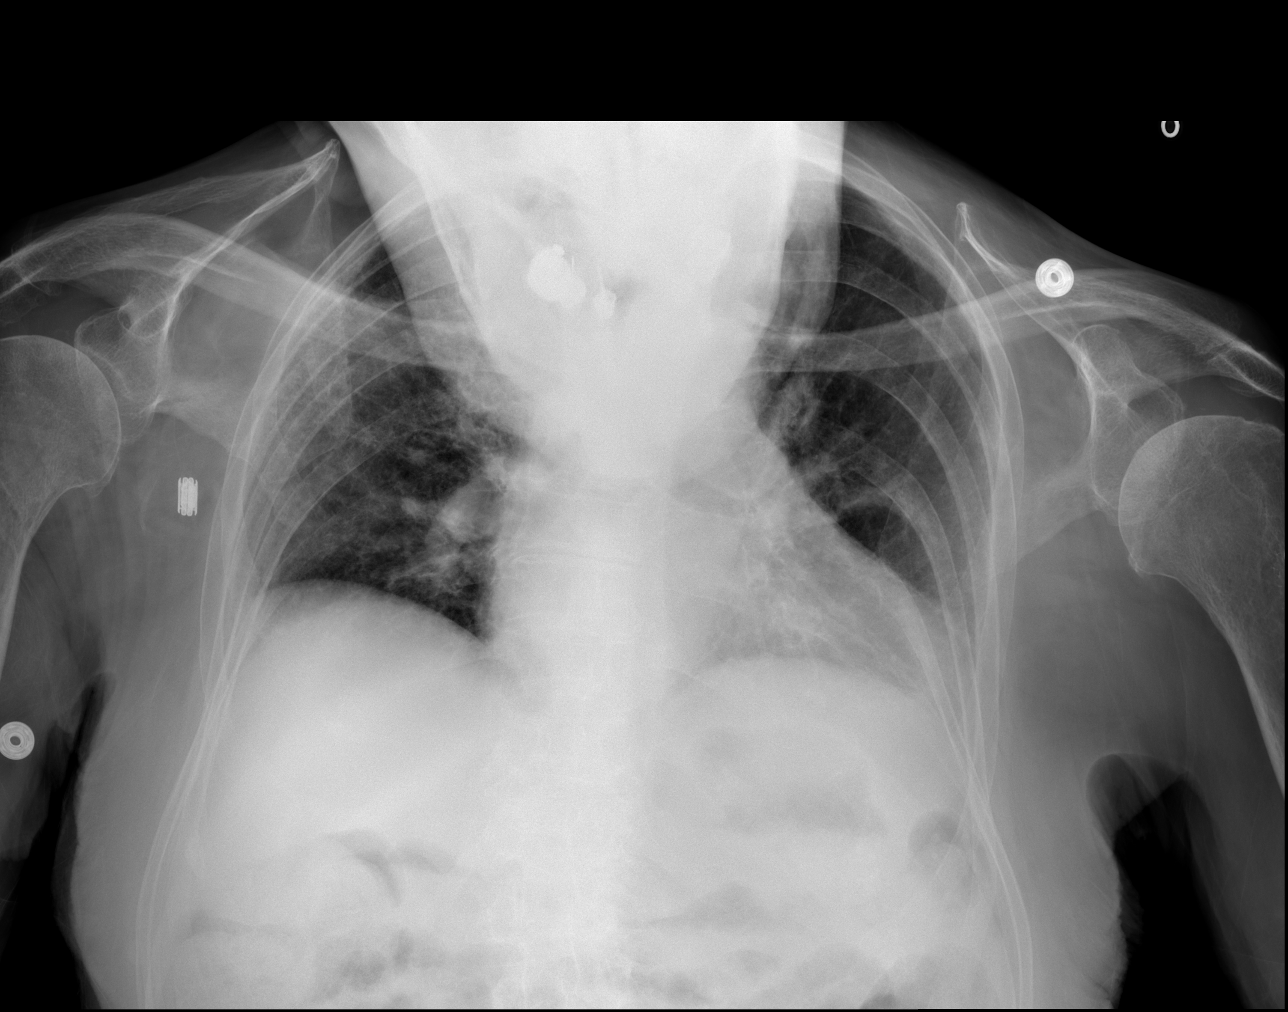

[3 of 3 positions shown; findings below may reference images not displayed]

FINDINGS: Poor inspiration. The patient's chin is obscuring the medial lung
apices. Stable prominence of the interstitial markings. Diffuse
osteopenia. Mild scoliosis.
IMPRESSION: Limited examination demonstrating stable chronic interstitial lung
disease. No acute abnormality visible.
